# Patient Record
Sex: Female | Born: 1955
Health system: Southern US, Community
[De-identification: ages and names within clinical notes are randomized; demographics above are authoritative.]

## PROBLEM LIST (undated history)

## (undated) DIAGNOSIS — D7589 Other specified diseases of blood and blood-forming organs: Secondary | ICD-10-CM

## (undated) DIAGNOSIS — F419 Anxiety disorder, unspecified: Secondary | ICD-10-CM

## (undated) DIAGNOSIS — E785 Hyperlipidemia, unspecified: Secondary | ICD-10-CM

## (undated) DIAGNOSIS — J45909 Unspecified asthma, uncomplicated: Secondary | ICD-10-CM

## (undated) DIAGNOSIS — K5120 Ulcerative (chronic) proctitis without complications: Secondary | ICD-10-CM

## (undated) DIAGNOSIS — D649 Anemia, unspecified: Secondary | ICD-10-CM

## (undated) DIAGNOSIS — T7840XA Allergy, unspecified, initial encounter: Secondary | ICD-10-CM

## (undated) DIAGNOSIS — D473 Essential (hemorrhagic) thrombocythemia: Secondary | ICD-10-CM

## (undated) HISTORY — DX: Essential (hemorrhagic) thrombocythemia: D47.3

## (undated) HISTORY — PX: NASAL SEPTUM SURGERY: SHX37

## (undated) HISTORY — DX: Ulcerative (chronic) proctitis without complications: K51.20

## (undated) HISTORY — DX: Anxiety disorder, unspecified: F41.9

## (undated) HISTORY — DX: Unspecified asthma, uncomplicated: J45.909

## (undated) HISTORY — DX: Hyperlipidemia, unspecified: E78.5

## (undated) HISTORY — PX: TONSILLECTOMY: SUR1361

## (undated) HISTORY — DX: Allergy, unspecified, initial encounter: T78.40XA

## (undated) HISTORY — PX: COLONOSCOPY W/ BIOPSIES: SHX1374

## (undated) HISTORY — DX: Other specified diseases of blood and blood-forming organs: D75.89

---

## 1998-04-20 ENCOUNTER — Other Ambulatory Visit: Admission: RE | Admit: 1998-04-20 | Discharge: 1998-04-20 | Payer: Self-pay

## 1999-05-19 ENCOUNTER — Other Ambulatory Visit: Admission: RE | Admit: 1999-05-19 | Discharge: 1999-05-19 | Payer: Self-pay | Admitting: *Deleted

## 1999-08-25 ENCOUNTER — Encounter: Admission: RE | Admit: 1999-08-25 | Discharge: 1999-08-25 | Payer: Self-pay | Admitting: *Deleted

## 2000-08-14 ENCOUNTER — Other Ambulatory Visit: Admission: RE | Admit: 2000-08-14 | Discharge: 2000-08-14 | Payer: Self-pay | Admitting: Obstetrics and Gynecology

## 2000-09-11 ENCOUNTER — Encounter: Admission: RE | Admit: 2000-09-11 | Discharge: 2000-09-11 | Payer: Self-pay | Admitting: Obstetrics and Gynecology

## 2000-09-11 ENCOUNTER — Encounter: Payer: Self-pay | Admitting: Obstetrics and Gynecology

## 2000-09-27 ENCOUNTER — Encounter: Payer: Self-pay | Admitting: Family Medicine

## 2000-09-27 ENCOUNTER — Encounter: Admission: RE | Admit: 2000-09-27 | Discharge: 2000-09-27 | Payer: Self-pay | Admitting: Family Medicine

## 2000-11-05 ENCOUNTER — Ambulatory Visit (HOSPITAL_COMMUNITY): Admission: RE | Admit: 2000-11-05 | Discharge: 2000-11-05 | Payer: Self-pay | Admitting: Gastroenterology

## 2002-05-14 ENCOUNTER — Encounter: Admission: RE | Admit: 2002-05-14 | Discharge: 2002-05-14 | Payer: Self-pay | Admitting: Obstetrics and Gynecology

## 2002-05-14 ENCOUNTER — Encounter: Payer: Self-pay | Admitting: Obstetrics and Gynecology

## 2002-07-03 ENCOUNTER — Other Ambulatory Visit: Admission: RE | Admit: 2002-07-03 | Discharge: 2002-07-03 | Payer: Self-pay | Admitting: Obstetrics and Gynecology

## 2003-10-21 ENCOUNTER — Encounter: Admission: RE | Admit: 2003-10-21 | Discharge: 2003-10-21 | Payer: Self-pay | Admitting: Obstetrics and Gynecology

## 2008-06-30 ENCOUNTER — Telehealth (INDEPENDENT_AMBULATORY_CARE_PROVIDER_SITE_OTHER): Payer: Self-pay | Admitting: *Deleted

## 2008-08-18 ENCOUNTER — Other Ambulatory Visit: Admission: RE | Admit: 2008-08-18 | Discharge: 2008-08-18 | Payer: Self-pay | Admitting: Family Medicine

## 2011-03-03 NOTE — Procedures (Signed)
[place]  Patient:    Anita Rice, Anita Rice                           MRN: [id number] Proc. Date: 11/05/00 Attending:  Mickeal Skinner, M.D. CC:         Andee Poles, M.D.   Procedure Report  PROCEDURE:  Colonoscopy and colonic biopsy.  PROCEDURE INDICATION:  [name] (date of birth [birth year]) is a 55 year old female with a 20-year history of left-sided proctocolitis.  She is due for surveillance colonoscopy and colonic biopsies to rule out dysplasia. She recently started prednisone when she had a flare of her colitis, manifested by crampy left lower quadrant abdominal pain, intermittent diarrhea productive of mucus and blood.  I discussed with Ms. Simao the complications associated with colonoscopy and polypectomy, including the intestinal bleeding and intestinal perforation. Ms. Morr has signed the operative permit.  ENDOSCOPIST:  Mickeal Skinner, M.D.  PREMEDICATION:  Fentanyl 100 mcg, Versed 15 mg.  ENDOSCOPE:  Olympus pediatric colonoscope.  DESCRIPTION OF PROCEDURE:  After obtaining informed consent, the patient was placed in the left lateral decubitus position.  I administered intravenous fentanyl and intravenous Versed to achieve conscious sedation for the procedure.  The patients blood pressure and oxygen saturation and cardiac rhythm were monitored throughout the procedure and documented in the medical record.  Anal inspection was normal.  Digital rectal exam was normal.  The Olympus pediatric video colonoscope was used to introduce into the rectum and advanced to the cecum.  Colonic preparation for the exam today was excellent.  Rectum and left colon:  There is generalized proctocolitis extending to approximately 50 cm from the anal verge.  The mucosa appears red and edematous.  There are no ulcers identified.  The mucosa is not friable.  There are no polyps or suspicious mucosal lesions.  Splenic  flexure normal.  Transverse colon normal.  Hepatic flexure normal.  Ascending colon normal.  Cecum and ileocecal valve normal.  Biopsies were taken from the right colon, and biopsies were taken from the transverse-left colon to rule out dysplasia.  ASSESSMENT:  Left-sided proctocolitis.  No signs of colorectal neoplasia. Colonic biopsies to rule out dysplasia are pending. DD:  11/05/00 TD:  11/05/00 Job: [id number]

## 2011-04-05 ENCOUNTER — Other Ambulatory Visit: Payer: Self-pay | Admitting: Obstetrics & Gynecology

## 2011-04-05 DIAGNOSIS — Z1231 Encounter for screening mammogram for malignant neoplasm of breast: Secondary | ICD-10-CM

## 2011-04-20 ENCOUNTER — Ambulatory Visit
Admission: RE | Admit: 2011-04-20 | Discharge: 2011-04-20 | Disposition: A | Discharge status: Home / Self Care | Payer: Self-pay | Source: Ambulatory Visit | Attending: Obstetrics & Gynecology | Admitting: Obstetrics & Gynecology

## 2011-04-20 DIAGNOSIS — Z1231 Encounter for screening mammogram for malignant neoplasm of breast: Secondary | ICD-10-CM

## 2013-07-25 ENCOUNTER — Encounter (INDEPENDENT_AMBULATORY_CARE_PROVIDER_SITE_OTHER): Payer: BC Managed Care – PPO | Admitting: Ophthalmology

## 2013-07-25 DIAGNOSIS — H3560 Retinal hemorrhage, unspecified eye: Secondary | ICD-10-CM

## 2013-07-25 DIAGNOSIS — H43819 Vitreous degeneration, unspecified eye: Secondary | ICD-10-CM

## 2013-07-25 DIAGNOSIS — H2510 Age-related nuclear cataract, unspecified eye: Secondary | ICD-10-CM

## 2013-09-05 ENCOUNTER — Encounter (INDEPENDENT_AMBULATORY_CARE_PROVIDER_SITE_OTHER): Payer: BC Managed Care – PPO | Admitting: Ophthalmology

## 2014-01-02 ENCOUNTER — Other Ambulatory Visit: Payer: Self-pay

## 2014-01-02 DIAGNOSIS — Z1231 Encounter for screening mammogram for malignant neoplasm of breast: Secondary | ICD-10-CM

## 2014-01-22 ENCOUNTER — Ambulatory Visit
Admission: RE | Admit: 2014-01-22 | Discharge: 2014-01-22 | Disposition: A | Discharge status: Home / Self Care | Payer: BC Managed Care – PPO | Source: Ambulatory Visit

## 2014-01-22 DIAGNOSIS — Z1231 Encounter for screening mammogram for malignant neoplasm of breast: Secondary | ICD-10-CM

## 2014-03-30 ENCOUNTER — Other Ambulatory Visit: Payer: Self-pay | Admitting: Gastroenterology

## 2014-06-01 ENCOUNTER — Encounter: Payer: Self-pay | Admitting: Hematology and Oncology

## 2014-06-01 ENCOUNTER — Ambulatory Visit: Payer: BC Managed Care – PPO

## 2014-06-01 ENCOUNTER — Ambulatory Visit (HOSPITAL_BASED_OUTPATIENT_CLINIC_OR_DEPARTMENT_OTHER): Payer: BC Managed Care – PPO | Admitting: Hematology and Oncology

## 2014-06-01 ENCOUNTER — Telehealth: Payer: Self-pay | Admitting: Hematology and Oncology

## 2014-06-01 ENCOUNTER — Encounter (INDEPENDENT_AMBULATORY_CARE_PROVIDER_SITE_OTHER): Payer: Self-pay

## 2014-06-01 VITALS — BP 116/77 | HR 94 | Temp 98.5°F | Resp 18 | Ht 60.0 in | Wt 117.1 lb

## 2014-06-01 DIAGNOSIS — D75839 Thrombocytosis, unspecified: Secondary | ICD-10-CM

## 2014-06-01 DIAGNOSIS — D7589 Other specified diseases of blood and blood-forming organs: Secondary | ICD-10-CM

## 2014-06-01 DIAGNOSIS — G621 Alcoholic polyneuropathy: Secondary | ICD-10-CM | POA: Insufficient documentation

## 2014-06-01 DIAGNOSIS — D473 Essential (hemorrhagic) thrombocythemia: Secondary | ICD-10-CM | POA: Insufficient documentation

## 2014-06-01 DIAGNOSIS — G609 Hereditary and idiopathic neuropathy, unspecified: Secondary | ICD-10-CM

## 2014-06-01 HISTORY — DX: Thrombocytosis, unspecified: D75.839

## 2014-06-01 HISTORY — DX: Other specified diseases of blood and blood-forming organs: D75.89

## 2014-06-01 NOTE — Progress Notes (Signed)
Patient has appt card and [place] been [place]. Checked in new patient with no financial issues prior to seeing dr.

## 2014-06-01 NOTE — Telephone Encounter (Signed)
Pt confirmed labs/ov per 08/17 POF, gave pt AVS...Marland KitchenMarland KitchenKJ

## 2014-06-01 NOTE — Assessment & Plan Note (Signed)
I suspect this is related to alcohol intake. I recommended she discontinue alcohol intake for minimum 2 weeks before her next blood work.

## 2014-06-01 NOTE — Assessment & Plan Note (Signed)
Serum protein electrophoresis showed no abnormal M spike. Serum vitamin B12 and thyroid function tests were normal. I suspect the mild peripheral neuropathy could be related to alcohol intake.

## 2014-06-01 NOTE — Assessment & Plan Note (Signed)
The cause is unknown, but likely to be reactive in nature as it is improving since a recent prednisone taper. I recommend a recheck in in 6 weeks.

## 2014-06-01 NOTE — Progress Notes (Signed)
[place] NOTE  Patient Care Team: Carlos Levering, PA-C as PCP - General (Family Medicine) Carlos Levering, PA-C as Referring Physician (Family Medicine)  CHIEF COMPLAINTS/PURPOSE OF CONSULTATION:  Macrocytosis, thrombocytosis  HISTORY OF PRESENTING ILLNESS:  Anita Rice 57 y.o. female is here because of elevated platelet count and abnormal MCV.  She was found to have abnormal CBC from routine blood monitoring. The patient had recent flare of ulcerative colitis in early June. Between 03/25/2014 to 05/21/2014, she has normal white blood cell count and normal hemoglobin. MCV ranged from 99-103.9 and platelet count ranged from 403-652. She denies recent infection. The last prescription antibiotics was more than 3 months ago There is not reported symptoms of sinus congestion, cough, urinary frequency/urgency or dysuria, diarrhea, joint swelling/pain or abnormal skin rash.  She had no prior history or diagnosis of cancer. Her age appropriate screening programs are up-to-date. She has significant alcohol intake with 2-3 drinks per day. She never had history of blood clots. She complained of some mild numbness in the right big toe and that the middle finger on the right hand. She recently was started on iron therapy due to the high MCV the iron studies subsequently showed adequate ferritin level.  MEDICAL HISTORY:  Past Medical History  Diagnosis Date  . Allergy   . Ulcerative colitis confined to rectum   . Macrocytosis without anemia 06/01/2014  . Thrombocytosis 06/01/2014    SURGICAL HISTORY: Past Surgical History  Procedure Laterality Date  . Colonoscopy w/ biopsies    . Tonsillectomy    . Nasal septum surgery      SOCIAL HISTORY: History   Social History  . Marital Status: Married    Spouse Name: N/A    Number of Children: N/A  . Years of Education: N/A   Occupational History  . Not on file.   Social History Main Topics  . Smoking status:  Never Smoker   . Smokeless tobacco: Never Used  . Alcohol Use: Not on file  . Drug Use: No  . Sexual Activity: Not on file   Other Topics Concern  . Not on file   Social History Narrative  . No narrative on file    FAMILY HISTORY: Family History  Problem Relation Age of Onset  . Cancer Paternal Grandfather     lung ca    ALLERGIES:  is allergic to benadryl and neosporin.  MEDICATIONS:  Current Outpatient Prescriptions  Medication Sig Dispense Refill  . albuterol (PROVENTIL HFA;VENTOLIN HFA) 108 (90 BASE) MCG/ACT inhaler Inhale into the lungs every 6 (six) hours as needed for wheezing or shortness of breath.      . amphetamine-dextroamphetamine (ADDERALL XR) 30 MG 24 hr capsule Take 30 mg by mouth daily.      . budesonide-formoterol (SYMBICORT) 160-4.5 MCG/ACT inhaler Inhale 2 puffs into the lungs daily.      Marland Kitchen desoximetasone (TOPICORT) 0.25 % cream Apply 1 application topically as needed.      . Estradiol (VAGIFEM) 10 MCG TABS vaginal tablet Place vaginally 2 (two) times a week.      . Ferrous Sulfate (IRON) 28 MG TABS Take by mouth daily.      . fexofenadine (ALLEGRA) 180 MG tablet Take 180 mg by mouth daily.      . fluticasone (FLONASE) 50 MCG/ACT nasal spray Place 1 spray into both nostrils daily.      Marland Kitchen ibuprofen (ADVIL,MOTRIN) 200 MG tablet Take 400 mg by mouth every 6 (six) hours as needed.      Marland Kitchen  LORazepam (ATIVAN) 0.5 MG tablet Take 0.5 mg by mouth daily as needed for anxiety.      . Multiple Vitamins-Minerals (CENTRUM SILVER PO) Take by mouth daily.       No current facility-administered medications for this visit.    REVIEW OF SYSTEMS:   Constitutional: Denies fevers, chills or abnormal night sweats Eyes: Denies blurriness of vision, double vision or watery eyes Ears, nose, mouth, throat, and face: Denies mucositis or sore throat Respiratory: Denies cough, dyspnea or wheezes Cardiovascular: Denies palpitation, chest discomfort or lower extremity  swelling Gastrointestinal:  Denies nausea, heartburn Skin: Denies abnormal skin rashes Lymphatics: Denies new lymphadenopathy or easy bruising Neurological:Denies numbness, tingling or new weaknesses Behavioral/Psych: Mood is stable, no new changes  All other systems were reviewed with the patient and are negative.  PHYSICAL EXAMINATION: ECOG PERFORMANCE STATUS: 0 - Asymptomatic  Filed Vitals:   06/01/14 1204  BP: 116/77  Pulse: 94  Temp: 98.5 F (36.9 C)  Resp: 18   Filed Weights   06/01/14 1204  Weight: 117 lb 1.6 oz (53.116 kg)    GENERAL:alert, no distress and comfortable SKIN: skin color, texture, turgor are normal, no rashes or significant lesions EYES: normal, conjunctiva are pink and non-injected, sclera clear OROPHARYNX:no exudate, no erythema and lips, buccal mucosa, and tongue normal  NECK: supple, thyroid normal size, non-tender, without nodularity LYMPH:  no palpable lymphadenopathy in the cervical, axillary or inguinal LUNGS: clear to auscultation and percussion with normal breathing effort HEART: regular rate & rhythm and no murmurs and no lower extremity edema ABDOMEN:abdomen soft, non-tender and normal bowel sounds Musculoskeletal:no cyanosis of digits and no clubbing  PSYCH: alert & oriented x 3 with fluent speech NEURO: no focal motor/sensory deficits  LABORATORY DATA:  I have reviewed the data as listed   ASSESSMENT & PLAN Thrombocytosis The cause is unknown, but likely to be reactive in nature as it is improving since a recent prednisone taper. I recommend a recheck in in 6 weeks.  Macrocytosis without anemia I suspect this is related to alcohol intake. I recommended she discontinue alcohol intake for minimum 2 weeks before her next blood work.  Alcoholic peripheral neuropathy Serum protein electrophoresis showed no abnormal M spike. Serum vitamin B12 and thyroid function tests were normal. I suspect the mild peripheral neuropathy could be  related to alcohol intake.

## 2014-06-02 ENCOUNTER — Ambulatory Visit: Payer: BC Managed Care – PPO | Admitting: Hematology and Oncology

## 2014-06-02 ENCOUNTER — Ambulatory Visit: Payer: BC Managed Care – PPO

## 2014-06-15 ENCOUNTER — Encounter (HOSPITAL_COMMUNITY): Payer: Self-pay | Admitting: Pharmacy Technician

## 2014-07-02 ENCOUNTER — Telehealth: Payer: Self-pay | Admitting: *Deleted

## 2014-07-02 NOTE — Telephone Encounter (Signed)
Left VM for pt to call nurse back regarding CBC results we received from [facility].  Dr. Alvy Bimler says labs are ok and no further labs needed from [place].

## 2014-07-03 ENCOUNTER — Telehealth: Payer: Self-pay | Admitting: *Deleted

## 2014-07-03 ENCOUNTER — Encounter (HOSPITAL_COMMUNITY): Payer: Self-pay | Admitting: Pharmacy Technician

## 2014-07-03 ENCOUNTER — Encounter (HOSPITAL_COMMUNITY): Payer: Self-pay | Admitting: *Deleted

## 2014-07-03 NOTE — Telephone Encounter (Signed)
Informed pt that labs received from PCP office are good and dr. Alvy Bimler says no need for pt to return here for lab or office visit.  Instructed PCP can monitor pts lab and we can see her back in the future if things change.  Pt verbalized understanding. She requests Dr. Alvy Bimler to send message or note to her PCP,  Carlos Levering PA-C,  Informing her of this.

## 2014-07-06 ENCOUNTER — Other Ambulatory Visit: Payer: Self-pay | Admitting: Gastroenterology

## 2014-07-06 NOTE — Anesthesia Preprocedure Evaluation (Signed)
Anesthesia Evaluation  Patient identified by MRN, date of birth, ID band Patient awake    Reviewed: Allergy & Precautions, H&P , NPO status , Patient's Chart, lab work & pertinent test results  Airway       Dental   Pulmonary          Cardiovascular     Neuro/Psych    GI/Hepatic   Endo/Other    Renal/GU      Musculoskeletal   Abdominal   Peds  Hematology   Anesthesia Other Findings   Reproductive/Obstetrics                           Anesthesia Physical Anesthesia Plan  ASA: II  Anesthesia Plan: MAC   Post-op Pain Management:    Induction: Intravenous  Airway Management Planned: Nasal Cannula  Additional Equipment:   Intra-op Plan:   Post-operative Plan:   Informed Consent: I have reviewed the patients History and Physical, chart, labs and discussed the procedure including the risks, benefits and alternatives for the proposed anesthesia with the patient or authorized representative who has indicated his/her understanding and acceptance.     Plan Discussed with:   Anesthesia Plan Comments:         Anesthesia Quick Evaluation

## 2014-07-07 ENCOUNTER — Encounter (HOSPITAL_COMMUNITY): Payer: BC Managed Care – PPO | Admitting: Anesthesiology

## 2014-07-07 ENCOUNTER — Encounter (HOSPITAL_COMMUNITY)
Admission: RE | Disposition: A | Discharge status: Home / Self Care | Payer: Self-pay | Source: Ambulatory Visit | Attending: Gastroenterology

## 2014-07-07 ENCOUNTER — Encounter (HOSPITAL_COMMUNITY): Payer: Self-pay | Admitting: *Deleted

## 2014-07-07 ENCOUNTER — Ambulatory Visit (HOSPITAL_COMMUNITY)
Admission: RE | Admit: 2014-07-07 | Discharge: 2014-07-07 | Disposition: A | Discharge status: Home / Self Care | Payer: BC Managed Care – PPO | Source: Ambulatory Visit | Attending: Gastroenterology | Admitting: Gastroenterology

## 2014-07-07 ENCOUNTER — Ambulatory Visit (HOSPITAL_COMMUNITY): Payer: BC Managed Care – PPO | Admitting: Anesthesiology

## 2014-07-07 DIAGNOSIS — K5100 Ulcerative (chronic) pancolitis without complications: Secondary | ICD-10-CM | POA: Insufficient documentation

## 2014-07-07 HISTORY — PX: COLONOSCOPY WITH PROPOFOL: SHX5780

## 2014-07-07 HISTORY — DX: Anemia, unspecified: D64.9

## 2014-07-07 SURGERY — COLONOSCOPY WITH PROPOFOL
Anesthesia: Monitor Anesthesia Care

## 2014-07-07 MED ORDER — SODIUM CHLORIDE 0.9 % IV SOLN: INTRAVENOUS | Status: DC

## 2014-07-07 MED ORDER — PROPOFOL 10 MG/ML IV BOLUS
INTRAVENOUS | Status: AC
  Filled 2014-07-07: qty 20

## 2014-07-07 MED ORDER — PROPOFOL INFUSION 10 MG/ML OPTIME
INTRAVENOUS | Status: DC | PRN
  Administered 2014-07-07: 75 ug/kg/min via INTRAVENOUS

## 2014-07-07 MED ORDER — MEPERIDINE HCL 100 MG/ML IJ SOLN: 6.2500 mg | INTRAMUSCULAR | Status: DC | PRN

## 2014-07-07 MED ORDER — FENTANYL CITRATE 0.05 MG/ML IJ SOLN: 25.0000 ug | INTRAMUSCULAR | Status: DC | PRN

## 2014-07-07 MED ORDER — PROMETHAZINE HCL 25 MG/ML IJ SOLN: 6.2500 mg | INTRAMUSCULAR | Status: DC | PRN

## 2014-07-07 MED ORDER — LACTATED RINGERS IV SOLN
INTRAVENOUS | Status: DC
  Administered 2014-07-07: 1000 mL via INTRAVENOUS

## 2014-07-07 MED ORDER — PROPOFOL 10 MG/ML IV BOLUS
INTRAVENOUS | Status: DC | PRN
  Administered 2014-07-07 (×4): 20 mg via INTRAVENOUS
  Administered 2014-07-07: 40 mg via INTRAVENOUS
  Administered 2014-07-07 (×2): 20 mg via INTRAVENOUS

## 2014-07-07 MED ORDER — LACTATED RINGERS IV SOLN
INTRAVENOUS | Status: DC | PRN
  Administered 2014-07-07: 11:00:00 via INTRAVENOUS

## 2014-07-07 SURGICAL SUPPLY — 22 items

## 2014-07-07 NOTE — Anesthesia Postprocedure Evaluation (Signed)
  Anesthesia Post-op Note  Patient: Anita Rice  Procedure(s) Performed: Procedure(s): COLONOSCOPY WITH PROPOFOL (N/A)  Patient Location: PACU  Anesthesia Type:MAC  Level of Consciousness: awake and alert   Airway and Oxygen Therapy: Patient Spontanous Breathing and Patient connected to nasal cannula oxygen  Post-op Pain: none  Post-op Assessment: Post-op Vital signs reviewed  Post-op Vital Signs: Reviewed and stable  Last Vitals:  Filed Vitals:   07/07/14 1138  BP: 101/63  Pulse: 85  Temp:   Resp: 17    Complications: No apparent anesthesia complications

## 2014-07-07 NOTE — Op Note (Signed)
Procedure: Surveillance colonoscopy. Universal ulcerative proctocolitis diagnosed at age 58. 09/28/2008 colonoscopy showed mild proctosigmoiditis without dysplasia  Endoscopist: [name]  Premedication: Propofol administered by anesthesia  Procedure: The patient was placed in the left lateral decubitus position. Anal inspection and digital rectal exam were normal. The Pentax pediatric colonoscope was introduced into the rectum and advanced to the cecum. A normal-appearing appendiceal orifice was identified. A normal-appearing ileocecal valve was intubated and the terminal ileum inspected. Colonic preparation for the exam today was good.  Rectum. Normal except for generalized lost in the mucosal vascular pattern unassociated with a mucosal friability or ulceration.  Sigmoid colon and descending colon. Normal  Splenic flexure. Normal  Transverse colon. Normal  Hepatic flexure. Normal  Ascending colon. Normal  Cecum and ileocecal valve. Normal  Terminal ileum. Normal  Surveillance mucosal biopsies: 8 biopsies were performed from the right colon, 8 biopsies were performed from the transverse colon, 8 biopsies were performed from the left colon, 8 biopsies were performed from the rectosigmoid colon.  Assessment: Normal surveillance colonoscopy. Inactive universal ulcerative colitis. Surveillance mucosal biopsies pending.

## 2014-07-07 NOTE — H&P (Signed)
  Procedure: Surveillance colonoscopy. 09/28/2008 colonoscopy showed mild proctosigmoiditis without dysplasia. Chronic universal ulcerative colitis  History: The patient is a 58 year old female born October 11, 1956. She has chronic universal ulcerative colitis. On 03/30/2014, the patient developed bloody diarrhea unassociated with prior antibiotic use. Her bloody diarrhea resolved after a course of prednisone  The patient reports normal bowel function and denies abdominal pain, diarrhea, or gastrointestinal bleeding.  She is scheduled to undergo a surveillance colonoscopy today.  Past medical history: Anal sphincter repair. Nasal surgery. Tonsillectomy. Retinal tear. Allergic rhinitis. Mild asthma. Atopic dermatitis. Chronic universal ulcerative colitis. Shingles diagnosed in April 2014.  Medication allergies: Neosporin causes rash.  Exam: The patient is alert and lying comfortably on the endoscopy stretcher. Abdomen is soft and nontender to palpation. Lungs are clear to auscultation. Cardiac exam reveals a regular rhythm.  Plan: Proceed with surveillance colonoscopy

## 2014-07-07 NOTE — Transfer of Care (Signed)
Immediate Anesthesia Transfer of Care Note  Patient: Anita Rice  Procedure(s) Performed: Procedure(s) (LRB): COLONOSCOPY WITH PROPOFOL (N/A)  Patient Location: PACU  Anesthesia Type: MAC  Level of Consciousness: sedated, patient cooperative and responds to stimulation  Airway & Oxygen Therapy: Patient Spontanous Breathing and Patient connected to face mask oxgen  Post-op Assessment: Report given to PACU RN and Post -op Vital signs reviewed and stable  Post vital signs: Reviewed and stable  Complications: No apparent anesthesia complications

## 2014-07-08 ENCOUNTER — Encounter (HOSPITAL_COMMUNITY): Payer: Self-pay | Admitting: Gastroenterology

## 2014-07-20 ENCOUNTER — Other Ambulatory Visit: Payer: BC Managed Care – PPO

## 2014-07-23 ENCOUNTER — Ambulatory Visit: Payer: BC Managed Care – PPO | Admitting: Hematology and Oncology

## 2014-07-31 ENCOUNTER — Telehealth: Payer: Self-pay | Admitting: Nurse Practitioner

## 2014-07-31 ENCOUNTER — Telehealth: Payer: Self-pay | Admitting: Hematology and Oncology

## 2014-07-31 NOTE — Telephone Encounter (Signed)
Patient informed that Dr. Alvy Bimler reviewed labs and wants to see her back in 1 month. Patient prefers 08/21/14 at 2:30.

## 2014-07-31 NOTE — Telephone Encounter (Signed)
Confirm appt d/t per 07/31/14 for 08/21/14.

## 2014-07-31 NOTE — Telephone Encounter (Signed)
POF sent to scheduling on 07/31/14 for f/u appt 15 min at 2:30 per patient request. OK per Dr. Alvy Bimler.

## 2014-08-03 NOTE — Progress Notes (Signed)
HPI: 58 year old female for evaluation of chest pain. Patient does note mild dyspnea on exertion but there is no orthopnea, PND, pedal edema, palpitations, syncope. She had brief chest pain for one to 2 seconds some weeks ago but does not have exertional chest pain. She has a strong family history of coronary disease and presented for further evaluation. Note she also has some fatigue although she is presently being evaluated for elevated platelet count.  Current Outpatient Prescriptions  Medication Sig Dispense Refill  . albuterol (PROVENTIL HFA;VENTOLIN HFA) 108 (90 BASE) MCG/ACT inhaler Inhale into the lungs every 6 (six) hours as needed for wheezing or shortness of breath.      . amphetamine-dextroamphetamine (ADDERALL XR) 30 MG 24 hr capsule Take 30 mg by mouth every morning.       . budesonide-formoterol (SYMBICORT) 160-4.5 MCG/ACT inhaler Inhale 2 puffs into the lungs every morning.       . desoximetasone (TOPICORT) 0.25 % cream Apply 1 application topically once as needed (psoriasis).       . Estradiol (VAGIFEM) 10 MCG TABS vaginal tablet Place vaginally 2 (two) times a week.      . Ferrous Sulfate (IRON) 28 MG TABS Take 1 tablet by mouth every morning.       . fexofenadine (ALLEGRA) 180 MG tablet Take 180 mg by mouth every morning.       . fluticasone (FLONASE) 50 MCG/ACT nasal spray Place 1 spray into both nostrils every morning.       Marland Kitchen ibuprofen (ADVIL,MOTRIN) 200 MG tablet Take 400 mg by mouth every 6 (six) hours as needed for headache or mild pain.       Marland Kitchen LORazepam (ATIVAN) 0.5 MG tablet Take 0.5 mg by mouth daily as needed for anxiety.      . Multiple Vitamins-Minerals (CENTRUM SILVER PO) Take 1 tablet by mouth every morning.        No current facility-administered medications for this visit.    Allergies  Allergen Reactions  . Benadryl [Diphenhydramine] Itching  . Neosporin [Neomycin-Bacitracin Zn-Polymyx] Rash    Past Medical History  Diagnosis Date  . Allergy     . Ulcerative colitis confined to rectum   . Macrocytosis without anemia 06/01/2014  . Thrombocytosis 06/01/2014  . Anemia   . Hyperlipidemia     Past Surgical History  Procedure Laterality Date  . Colonoscopy w/ biopsies    . Tonsillectomy    . Nasal septum surgery    . Colonoscopy with propofol N/A 07/07/2014    Procedure: COLONOSCOPY WITH PROPOFOL;  Surgeon: Garlan Fair, MD;  Location: [facility];  Service: Endoscopy;  Laterality: N/A;    History   Social History  . Marital Status: Married    Spouse Name: N/A    Number of Children: 2  . Years of Education: N/A   Occupational History  .      Glass blower/designer   Social History Main Topics  . Smoking status: Former Research scientist (life sciences)  . Smokeless tobacco: Never Used  . Alcohol Use: 8.4 oz/week    14 Glasses of wine per week     Comment: Occasional  . Drug Use: No  . Sexual Activity: Not on file   Other Topics Concern  . Not on file   Social History Narrative  . No narrative on file    Family History  Problem Relation Age of Onset  . Cancer Paternal Grandfather     lung ca  . CAD Father  Cabg at age 67  . CAD Mother     MI at age 20    ROS: Fatigue and bruising but no fevers or chills, productive cough, hemoptysis, dysphasia, odynophagia, melena, hematochezia, dysuria, hematuria, rash, seizure activity, orthopnea, PND, pedal edema, claudication. Remaining systems are negative.  Physical Exam:   Blood pressure 110/74, pulse 94, height 5' (1.524 m), weight 118 lb 6.4 oz (53.706 kg).  General:  Well developed/well nourished in NAD Skin warm/dry Patient not depressed No peripheral clubbing Back-normal HEENT-normal/normal eyelids Neck supple/normal carotid upstroke bilaterally; no bruits; no JVD; no thyromegaly chest - CTA/ normal expansion CV - RRR/normal S1 and S2; no murmurs, rubs or gallops;  PMI nondisplaced Abdomen -NT/ND, no HSM, no mass, + bowel sounds, no bruit 2+ femoral pulses, no bruits Ext-no  edema, chords, 2+ DP Neuro-grossly nonfocal  ECG Sinus rhythm at a rate of 94. No ST changes.

## 2014-08-04 ENCOUNTER — Encounter: Payer: Self-pay | Admitting: Cardiology

## 2014-08-04 ENCOUNTER — Ambulatory Visit (INDEPENDENT_AMBULATORY_CARE_PROVIDER_SITE_OTHER): Payer: BC Managed Care – PPO | Admitting: Cardiology

## 2014-08-04 VITALS — BP 110/74 | HR 94 | Ht 60.0 in | Wt 118.4 lb

## 2014-08-04 DIAGNOSIS — R079 Chest pain, unspecified: Secondary | ICD-10-CM

## 2014-08-04 DIAGNOSIS — I2510 Atherosclerotic heart disease of native coronary artery without angina pectoris: Secondary | ICD-10-CM

## 2014-08-04 DIAGNOSIS — R5383 Other fatigue: Secondary | ICD-10-CM

## 2014-08-04 NOTE — Assessment & Plan Note (Signed)
Etiology unclear. Recent hemoglobin normal. Check TSH. Followup primary care for history of elevated platelet count.

## 2014-08-04 NOTE — Patient Instructions (Signed)
Your physician recommends that you schedule a follow-up appointment in: AS NEEDED PENDING TEST RESULTS  Your physician recommends that you HAVE LAB [place]  Your physician has requested that you have an exercise tolerance test. For further information please visit [email or web address]. Please also follow instruction sheet, as given.   CARDIAC CT CALCIUM SCORE AT [place]

## 2014-08-04 NOTE — Assessment & Plan Note (Signed)
Patient has atypical chest pain and mild dyspnea on exertion. However she has a strong family history of coronary disease and is also concerned. We will arrange an exercise treadmill. Also arrange calcium score via CT.

## 2014-08-05 ENCOUNTER — Ambulatory Visit (INDEPENDENT_AMBULATORY_CARE_PROVIDER_SITE_OTHER)
Admission: RE | Admit: 2014-08-05 | Discharge: 2014-08-05 | Disposition: A | Discharge status: Home / Self Care | Payer: Self-pay | Source: Ambulatory Visit | Attending: Cardiology | Admitting: Cardiology

## 2014-08-05 DIAGNOSIS — I2510 Atherosclerotic heart disease of native coronary artery without angina pectoris: Secondary | ICD-10-CM

## 2014-08-05 DIAGNOSIS — R079 Chest pain, unspecified: Secondary | ICD-10-CM

## 2014-08-05 DIAGNOSIS — R5383 Other fatigue: Secondary | ICD-10-CM

## 2014-08-05 LAB — TSH: TSH: 1.181 u[IU]/mL (ref 0.350–4.500)

## 2014-08-06 ENCOUNTER — Other Ambulatory Visit: Payer: BC Managed Care – PPO

## 2014-08-07 ENCOUNTER — Ambulatory Visit (HOSPITAL_COMMUNITY)
Admission: RE | Admit: 2014-08-07 | Discharge: 2014-08-07 | Disposition: A | Discharge status: Home / Self Care | Payer: BC Managed Care – PPO | Source: Ambulatory Visit | Attending: Cardiovascular Disease | Admitting: Cardiovascular Disease

## 2014-08-07 DIAGNOSIS — R079 Chest pain, unspecified: Secondary | ICD-10-CM | POA: Diagnosis not present

## 2014-08-07 DIAGNOSIS — I2510 Atherosclerotic heart disease of native coronary artery without angina pectoris: Secondary | ICD-10-CM

## 2014-08-07 NOTE — Procedures (Signed)
Exercise Treadmill Test  Pre-Exercise Testing Evaluation  NSR, normal tracing  Test  Exercise Tolerance Test Ordering MD: Kirk Ruths, MD    Unique Test No: 1  Treadmill:  1  Indication for ETT: chest pain - rule out ischemia  Contraindication to ETT: No   Stress Modality: exercise - treadmill  Cardiac Imaging Performed: non   Protocol: standard Bruce - maximal  Max BP:  148/76  Max MPHR (bpm):  162 85% MPR (bpm):  137  MPHR obtained (bpm):  176 % MPHR obtained:  108  Reached 85% MPHR (min:sec):  1:12 Total Exercise Time (min-sec):  7  Workload in METS:  8.5 Borg Scale: 14  Reason ETT Terminated:  Leg fatigue    ST Segment Analysis At Rest: normal ST segments - no evidence of significant ST depression With Exercise: no evidence of significant ST depression  Other Information Arrhythmia:  No Angina during ETT:  absent (0) Quality of ETT:  diagnostic  ETT Interpretation:  normal - no evidence of ischemia by ST analysis  Comments: Fair exercise tolerance. Normotensive response to exercise.   Sanda Klein, MD, [facility] [phone] office [phone] pager

## 2014-08-21 ENCOUNTER — Encounter: Payer: Self-pay | Admitting: Hematology and Oncology

## 2014-08-21 ENCOUNTER — Telehealth: Payer: Self-pay | Admitting: *Deleted

## 2014-08-21 ENCOUNTER — Ambulatory Visit (HOSPITAL_BASED_OUTPATIENT_CLINIC_OR_DEPARTMENT_OTHER): Payer: BC Managed Care – PPO | Admitting: Hematology and Oncology

## 2014-08-21 VITALS — BP 119/82 | HR 83 | Temp 97.0°F | Resp 18 | Ht 60.0 in | Wt 119.0 lb

## 2014-08-21 DIAGNOSIS — Z23 Encounter for immunization: Secondary | ICD-10-CM

## 2014-08-21 DIAGNOSIS — D75839 Thrombocytosis, unspecified: Secondary | ICD-10-CM

## 2014-08-21 DIAGNOSIS — R5383 Other fatigue: Secondary | ICD-10-CM

## 2014-08-21 DIAGNOSIS — D7589 Other specified diseases of blood and blood-forming organs: Secondary | ICD-10-CM

## 2014-08-21 DIAGNOSIS — D473 Essential (hemorrhagic) thrombocythemia: Secondary | ICD-10-CM

## 2014-08-21 MED ORDER — INFLUENZA VAC SPLIT QUAD 0.5 ML IM SUSY
0.5000 mL | PREFILLED_SYRINGE | Freq: Once | INTRAMUSCULAR | Status: DC
  Filled 2014-08-21: qty 0.5

## 2014-08-21 NOTE — Progress Notes (Signed)
[facility] OFFICE PROGRESS NOTE  WILLARD,JENNIFER, PA-C SUMMARY OF HEMATOLOGIC HISTORY: Anita Rice 58 y.o. female is here because of elevated platelet count and abnormal MCV. She was found to have abnormal CBC from routine blood monitoring. The patient had recent flare of ulcerative colitis in early June. Between 03/25/2014 to 05/21/2014, she has normal white blood cell count and normal hemoglobin. MCV ranged from 99-103.9 and platelet count ranged from 403-652. She denies recent infection.  She has significant alcohol intake with 2-3 drinks per day. She never had history of blood clots. She complained of some mild numbness in the right big toe and that the middle finger on the right hand. She recently was started on iron therapy due to the high MCV the iron studies subsequently showed adequate ferritin level.  INTERVAL HISTORY: Anita Rice 58 y.o. female returns for further follow-up and review of blood tests. She continues to have mild fluctuation of MCV and platelet count. She is not symptomatic.  I have reviewed the past medical history, past surgical history, social history and family history with the patient and they are unchanged from previous note.  ALLERGIES:  is allergic to benadryl and neosporin.  MEDICATIONS:  Current Outpatient Prescriptions  Medication Sig Dispense Refill  . albuterol (PROVENTIL HFA;VENTOLIN HFA) 108 (90 BASE) MCG/ACT inhaler Inhale into the lungs every 6 (six) hours as needed for wheezing or shortness of breath.    . amphetamine-dextroamphetamine (ADDERALL XR) 30 MG 24 hr capsule Take 30 mg by mouth every morning.     . budesonide-formoterol (SYMBICORT) 160-4.5 MCG/ACT inhaler Inhale 2 puffs into the lungs every morning.     . desoximetasone (TOPICORT) 0.25 % cream Apply 1 application topically once as needed (psoriasis).     . Estradiol (VAGIFEM) 10 MCG TABS vaginal tablet Place vaginally 2 (two) times a week.    . Ferrous Sulfate  (IRON) 28 MG TABS Take 1 tablet by mouth every morning.     . fexofenadine (ALLEGRA) 180 MG tablet Take 180 mg by mouth every morning.     . fluticasone (FLONASE) 50 MCG/ACT nasal spray Place 1 spray into both nostrils every morning.     Marland Kitchen ibuprofen (ADVIL,MOTRIN) 200 MG tablet Take 400 mg by mouth every 6 (six) hours as needed for headache or mild pain.     Marland Kitchen LORazepam (ATIVAN) 0.5 MG tablet Take 0.5 mg by mouth daily as needed for anxiety.    . Multiple Vitamins-Minerals (CENTRUM SILVER PO) Take 1 tablet by mouth every morning.      Current Facility-Administered Medications  Medication Dose Route Frequency Provider Last Rate Last Dose  . Influenza vac split quadrivalent PF (FLUARIX) injection 0.5 mL  0.5 mL Intramuscular Once Heath Lark, MD         REVIEW OF SYSTEMS:   Constitutional: Denies fevers, chills or night sweats Eyes: Denies blurriness of vision Ears, nose, mouth, throat, and face: Denies mucositis or sore throat Respiratory: Denies cough, dyspnea or wheezes Cardiovascular: Denies palpitation, chest discomfort or lower extremity swelling Gastrointestinal:  Denies nausea, heartburn or change in bowel habits Skin: Denies abnormal skin rashes Lymphatics: Denies new lymphadenopathy or easy bruising Neurological:Denies numbness, tingling or new weaknesses Behavioral/Psych: Mood is stable, no new changes  All other systems were reviewed with the patient and are negative.  PHYSICAL EXAMINATION: ECOG PERFORMANCE STATUS: 0 - Asymptomatic  Filed Vitals:   08/21/14 1500  BP: 119/82  Pulse: 83  Temp: 97 F (36.1 C)  Resp: 18  Filed Weights   08/21/14 1500  Weight: 119 lb (53.978 kg)    GENERAL:alert, no distress and comfortable SKIN: skin color, texture, turgor are normal, no rashes or significant lesions EYES: normal, Conjunctiva are pink and non-injected, sclera clear OROPHARYNX:no exudate, no erythema and lips, buccal mucosa, and tongue normal  NECK: supple, thyroid  normal size, non-tender, without nodularity LYMPH:  no palpable lymphadenopathy in the cervical, axillary or inguinal LUNGS: clear to auscultation and percussion with normal breathing effort HEART: regular rate & rhythm and no murmurs and no lower extremity edema ABDOMEN:abdomen soft, non-tender and normal bowel sounds Musculoskeletal:no cyanosis of digits and no clubbing  NEURO: alert & oriented x 3 with fluent speech, no focal motor/sensory deficits  LABORATORY DATA:  I have reviewed the data as listed ASSESSMENT & PLAN:  Macrocytosis without anemia This has improved since she cut back on alcohol intake. I reassured her that this is benign.  Thrombocytosis This is mild and has fluctuated up and down in the past. I reassured her that this is benign. No further workup is needed.  Fatigue The patient is not anemic. Recent TSH is normal. I reassured her this is not related to her blood abnormalities.   All questions were answered. The patient knows to call the clinic with any problems, questions or concerns. No barriers to learning was detected.  I spent 15 minutes counseling the patient face to face. The total time spent in the appointment was 20 minutes and more than 50% was on counseling.     [facility], [place], MD 08/21/2014 4:01 PM

## 2014-08-21 NOTE — Assessment & Plan Note (Signed)
The patient is not anemic. Recent TSH is normal. I reassured her this is not related to her blood abnormalities.

## 2014-08-21 NOTE — Telephone Encounter (Signed)
Pt agreed to flu shot today when checked in by collaborative nurse.  Unfortunately she left after MD appt before nurse could give her flu shot.  Called pt and left her VM apologizing for not giving flu shot today.  Asked her to call back if she wants to come back for flu shot.

## 2014-08-21 NOTE — Assessment & Plan Note (Signed)
This has improved since she cut back on alcohol intake. I reassured her that this is benign.

## 2014-08-21 NOTE — Assessment & Plan Note (Signed)
This is mild and has fluctuated up and down in the past. I reassured her that this is benign. No further workup is needed.

## 2015-02-18 ENCOUNTER — Other Ambulatory Visit (HOSPITAL_COMMUNITY): Payer: Self-pay | Admitting: Family Medicine

## 2015-02-18 ENCOUNTER — Other Ambulatory Visit (HOSPITAL_COMMUNITY)
Admission: RE | Admit: 2015-02-18 | Discharge: 2015-02-18 | Disposition: A | Discharge status: Home / Self Care | Payer: BLUE CROSS/BLUE SHIELD | Source: Ambulatory Visit | Attending: Family Medicine | Admitting: Family Medicine

## 2015-02-18 DIAGNOSIS — Z124 Encounter for screening for malignant neoplasm of cervix: Secondary | ICD-10-CM | POA: Diagnosis present

## 2015-02-18 DIAGNOSIS — Z1151 Encounter for screening for human papillomavirus (HPV): Secondary | ICD-10-CM | POA: Diagnosis present

## 2015-02-19 LAB — CYTOLOGY - PAP

## 2016-02-02 DIAGNOSIS — L259 Unspecified contact dermatitis, unspecified cause: Secondary | ICD-10-CM | POA: Diagnosis not present

## 2016-02-02 DIAGNOSIS — S20211A Contusion of right front wall of thorax, initial encounter: Secondary | ICD-10-CM | POA: Diagnosis not present

## 2016-02-04 DIAGNOSIS — R21 Rash and other nonspecific skin eruption: Secondary | ICD-10-CM | POA: Diagnosis not present

## 2016-02-07 DIAGNOSIS — L308 Other specified dermatitis: Secondary | ICD-10-CM | POA: Diagnosis not present

## 2016-08-02 ENCOUNTER — Ambulatory Visit (INDEPENDENT_AMBULATORY_CARE_PROVIDER_SITE_OTHER): Payer: BLUE CROSS/BLUE SHIELD

## 2016-08-02 ENCOUNTER — Ambulatory Visit (INDEPENDENT_AMBULATORY_CARE_PROVIDER_SITE_OTHER): Payer: BLUE CROSS/BLUE SHIELD | Admitting: Physician Assistant

## 2016-08-02 ENCOUNTER — Encounter: Payer: Self-pay | Admitting: Physician Assistant

## 2016-08-02 VITALS — BP 100/64 | HR 75 | Temp 97.6°F | Resp 18 | Ht 60.0 in | Wt 125.0 lb

## 2016-08-02 DIAGNOSIS — Z23 Encounter for immunization: Secondary | ICD-10-CM | POA: Diagnosis not present

## 2016-08-02 DIAGNOSIS — M25572 Pain in left ankle and joints of left foot: Secondary | ICD-10-CM

## 2016-08-02 DIAGNOSIS — S99912A Unspecified injury of left ankle, initial encounter: Secondary | ICD-10-CM | POA: Diagnosis not present

## 2016-08-02 DIAGNOSIS — S92252A Displaced fracture of navicular [scaphoid] of left foot, initial encounter for closed fracture: Secondary | ICD-10-CM

## 2016-08-02 NOTE — Patient Instructions (Addendum)
[facility] will call you to schedule an appointment at their office this week. Please do not bear weight with your left foot. Use crutches in order to do this. Use anti-inflammatories as needed for pain. Elevate your foot and use ice to keep swelling at a minimum.  Please do not remove the splint. Wrap it with a trash bag to bathe. If you feel the ace wrap it too tight and is causing you pain, you may re-wrap as needed.   Thank you for coming in today. I hope you feel we met your needs.  Feel free to call [facility] if you have any questions or further requests.  Please consider signing up for MyChart if you do not already have it, as this is a great way to communicate with me.  Best,  Whitney McVey, PA-C   IF you received an x-ray today, you will receive an invoice from [facility]. Please contact [facility] at [phone] with questions or concerns regarding your invoice.   IF you received labwork today, you will receive an invoice from [employer]. Please contact [facility] at [phone] with questions or concerns regarding your invoice.   Our billing staff will not be able to assist you with questions regarding bills from these companies.  You will be contacted with the lab results as soon as they are available. The fastest way to get your results is to activate your My Chart account. Instructions are located on the last page of this paperwork. If you have not heard from [place] regarding the results in 2 weeks, please contact this office.     Tarsal Navicular Fracture A tarsal navicular fracture is a break in the navicular bone in your foot. The navicular bone is at the top of the middle of your foot. It is one of the bones in a group of bones called the tarsal bones. The navicular bone is wedged between other bones. Running and jumping put a lot of pressure on your navicular bone. Tarsal navicular fractures occur most often in athletes.   CAUSES  A tarsal navicular fracture can be caused by:  Severe twisting of your foot.  Something heavy falling on your foot.  Stress on the navicular bone from your foot striking the ground repeatedly (stress fracture). RISK FACTORS You may be at risk for a navicular fracture if you participate in high-impact activities such as:  Track and field.  Football.  Soccer.  Basketball.  Gymnastics.  Ballet dancing. Other risk factors include:  Being a woman with an irregular menstrual cycle.  Having a condition that causes your bones to become thin and brittle (osteoporosis).  Being a smoker.  Starting a new sport without being in good shape.  Wearing athletic shoes that do not fit well. SIGNS AND SYMPTOMS An aching pain at the top of your foot is the most common symptom. The pain may move down into the arch of your foot. The pain will get worse with activity and better with rest. Other symptoms may include:  Swelling on the top of your foot.  Pain when pressing on the top of your foot.  Pain when hopping on your foot. DIAGNOSIS  Your health care provider may suspect a tarsal navicular fracture if you recently injured your foot and have symptoms of a fracture. A physical exam will be done. During this exam, your health care provider may move your foot into different positions to check for pain. If you have pain when the health care  provider presses on your navicular bone, then it is very likely that you have a navicular fracture. An X-ray of your foot may be done to help confirm the diagnosis. Regular X-rays often do not show a stress fracture. You may need to have other imaging studies, such as:  A bone scan.  A CT scan.  An MRI. TREATMENT  Your health care provider will determine the best treatment for you based on the severity of your fracture.   If the broken bone is in good alignment, a cast or splint may be applied. The cast or splint will likely need to be worn  for several weeks. While the cast or splint is on, you cannot put weight on your foot. You will need close follow-up with your health care provider to make sure you are healing.  Rarely, if the fracture is severe and the broken bone is out of place, your health care provider will need to align the fracture using a surgical procedure called open reduction and internal fixation (ORIF).  In the ORIF procedure, the fracture site is opened up, and the bone pieces are fixed into place with metal screws or pins.  After surgery, you may need to wear a cast or splint. You will also need close follow-up with your health care provider to make sure you are healing. HOME CARE INSTRUCTIONS   Use crutches as directed by your health care provider. Do not put weight on your injured foot until your health care provider approves.  If you have a plaster or fiberglass cast:   Do not try to scratch the skin under the cast with sharp or pointed objects.  Check the skin around the cast every day. You may put lotion on any red or sore areas.   Keep your cast dry and clean.  Use a plastic bag to protect your cast or splint from water while bathing. Do not lower your cast or splint into water.  Take medicines only as directed by your health care provider.  Keep all follow-up visits as directed by your health care provider. This is important. SEEK MEDICAL CARE IF:   You have very bad pain, and medicine is not helping.  You have more than a small spot of bleeding from under your cast or splint.  You have drainage, redness, or swelling at the injury site.  You have a fever.  You notice a bad smell coming from your cast or splint.   Your cast or splint cracks, breaks, or gets wet. SEEK IMMEDIATE MEDICAL CARE IF:   You begin to lose feeling in your foot or toes.  You have swelling in your foot or toes that is increasing.  Your foot or toes feel cold or turn blue.  You develop a rash.  MAKE SURE  YOU:  Understand these instructions.  Will watch your condition.  Will get help right away if you are not doing well or get worse.   This information is not intended to replace advice given to you by your health care provider. Make sure you discuss any questions you have with your health care provider.   Document Released: 01/14/2001 Document Revised: 10/23/2014 Document Reviewed: 12/05/2013 Elsevier Interactive Patient Education [employer].

## 2016-08-02 NOTE — Progress Notes (Signed)
Anita Rice  MRN: [id number] DOB: 12-21-1955  PCP: Wynelle Fanny  Subjective:  Pt is a 60 year old female who presents to clinic for injured left ankle.  Two weeks ago she rolled her ankle on a curb and fell. Did not feel pop or tear. No pain or swelling at time of injury. Noted bruising surrounding her ankle at the time. She was on vacation in [place] and was able to walk with no problems/issues/pain.  12 days later she noticed swelling of left ankle. Painful to touch above her fourth toe and in the front, center of ankle. No pain while she is still, pain with movement/walking. Her pain increased last night. She elevated and iced, felt better.  No history of ankle injuries/surgery. 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  Smokes 1 ppd.  Review of Systems  Respiratory: Negative.   Cardiovascular: Negative.   Musculoskeletal: Positive for arthralgias (left ankle pain) and gait problem. Negative for back pain and joint swelling.  Skin: Negative for color change, pallor and wound.  Neurological: Negative for dizziness, weakness and numbness.    Patient Active Problem List   Diagnosis Date Noted  . Chest pain 08/04/2014  . Fatigue 08/04/2014  . Macrocytosis without anemia 06/01/2014  . Thrombocytosis ([place]) 06/01/2014  . Alcoholic peripheral neuropathy ([place]) 06/01/2014    Current Outpatient Prescriptions on File Prior to Visit  Medication Sig Dispense Refill  . albuterol (PROVENTIL HFA;VENTOLIN HFA) 108 (90 BASE) MCG/ACT inhaler Inhale into the lungs every 6 (six) hours as needed for wheezing or shortness of breath.    . amphetamine-dextroamphetamine (ADDERALL XR) 30 MG 24 hr capsule Take 30 mg by mouth every morning.     . budesonide-formoterol (SYMBICORT) 160-4.5 MCG/ACT inhaler Inhale 2 puffs into the lungs every morning.     . desoximetasone (TOPICORT) 0.25 % cream Apply 1 application topically once as needed (psoriasis).     . Estradiol (VAGIFEM) 10 MCG TABS  vaginal tablet Place vaginally 2 (two) times a week.    . Ferrous Sulfate (IRON) 28 MG TABS Take 1 tablet by mouth every morning.     . fexofenadine (ALLEGRA) 180 MG tablet Take 180 mg by mouth every morning.     . fluticasone (FLONASE) 50 MCG/ACT nasal spray Place 1 spray into both nostrils every morning.     Marland Kitchen ibuprofen (ADVIL,MOTRIN) 200 MG tablet Take 400 mg by mouth every 6 (six) hours as needed for headache or mild pain.     Marland Kitchen LORazepam (ATIVAN) 0.5 MG tablet Take 0.5 mg by mouth daily as needed for anxiety.    . Multiple Vitamins-Minerals (CENTRUM SILVER PO) Take 1 tablet by mouth every morning.      No current facility-administered medications on file prior to visit.     Allergies  Allergen Reactions  . Benadryl [Diphenhydramine] Itching  . Neosporin [Neomycin-Bacitracin Zn-Polymyx] Rash    Objective:  BP 100/64 (BP Location: Right Arm, Patient Position: Sitting, Cuff Size: Small)   Pulse 75   Temp 97.6 F (36.4 C) (Oral)   Resp 18   Ht 5' (1.524 m)   Wt 125 lb (56.7 kg)   SpO2 99%   BMI 24.41 kg/m   Physical Exam  Constitutional: She is oriented to person, place, and time and well-developed, well-nourished, and in no distress. No distress.  Cardiovascular: Normal rate, regular rhythm and normal heart sounds.   Musculoskeletal:       Right shoulder: Swelling: minor.  Left ankle: She exhibits normal range of motion, no swelling, no deformity and normal pulse. Tenderness. Lateral malleolus and AITFL tenderness found. Achilles tendon normal.  TTP distal medial malleolus. No decreased strength with passive dorsal or plantar flexion or against resistance. Normal gait. Normal ROM. Sensation in tact.   Neurological: She is alert and oriented to person, place, and time. GCS score is 15.  Skin: Skin is warm and dry.  Psychiatric: Mood, memory, affect and judgment normal.  Vitals reviewed.   Dg Ankle Complete Left  Result Date: 08/02/2016 CLINICAL DATA:  Left ankle  pain after fall 2 weeks ago. EXAM: LEFT ANKLE COMPLETE - 3+ VIEW COMPARISON:  None. FINDINGS: There is a possible mildly displaced fracture involving the superior surface of the navicular bone. The tibia and fibula appear normal. Joint spaces are intact. No soft tissue abnormality is noted. IMPRESSION: Possible mildly displaced fracture arising from the superior aspect of the navicular bone. No other abnormality seen. Electronically Signed   By: Marijo Conception, M.D.   On: 08/02/2016 10:32    Assessment and Plan :  1. Closed displaced fracture of navicular bone of left foot, initial encounter 2. Pain of joint of left ankle and foot - Ambulatory referral to Orthopedic Surgery - DG Ankle Complete Left; Future - Applied a padded posterior splint with side stirrups. She is to be non weight bearing with use of crutches. Splint care discussed with patient. She understands the plan.  3. Need for prophylactic vaccination and inoculation against influenza - Flu Vaccine QUAD 36+ mos IM   Mercer Pod, PA-C  [facility] 08/02/2016 10:08 AM

## 2016-08-04 DIAGNOSIS — M25571 Pain in right ankle and joints of right foot: Secondary | ICD-10-CM | POA: Diagnosis not present

## 2016-08-18 DIAGNOSIS — M25572 Pain in left ankle and joints of left foot: Secondary | ICD-10-CM | POA: Diagnosis not present

## 2016-08-18 DIAGNOSIS — M25571 Pain in right ankle and joints of right foot: Secondary | ICD-10-CM | POA: Diagnosis not present

## 2016-08-31 DIAGNOSIS — M25572 Pain in left ankle and joints of left foot: Secondary | ICD-10-CM | POA: Diagnosis not present

## 2016-09-04 DIAGNOSIS — M25572 Pain in left ankle and joints of left foot: Secondary | ICD-10-CM | POA: Diagnosis not present

## 2016-09-05 DIAGNOSIS — M25572 Pain in left ankle and joints of left foot: Secondary | ICD-10-CM | POA: Diagnosis not present

## 2016-09-12 DIAGNOSIS — M25572 Pain in left ankle and joints of left foot: Secondary | ICD-10-CM | POA: Diagnosis not present

## 2016-09-14 DIAGNOSIS — M25572 Pain in left ankle and joints of left foot: Secondary | ICD-10-CM | POA: Diagnosis not present

## 2016-09-15 DIAGNOSIS — M25572 Pain in left ankle and joints of left foot: Secondary | ICD-10-CM | POA: Diagnosis not present

## 2016-09-21 ENCOUNTER — Ambulatory Visit (INDEPENDENT_AMBULATORY_CARE_PROVIDER_SITE_OTHER): Payer: BLUE CROSS/BLUE SHIELD | Admitting: Physician Assistant

## 2016-09-21 VITALS — BP 104/70 | HR 68 | Temp 98.5°F | Resp 16 | Ht 60.0 in | Wt 127.2 lb

## 2016-09-21 DIAGNOSIS — F988 Other specified behavioral and emotional disorders with onset usually occurring in childhood and adolescence: Secondary | ICD-10-CM

## 2016-09-21 DIAGNOSIS — N952 Postmenopausal atrophic vaginitis: Secondary | ICD-10-CM | POA: Diagnosis not present

## 2016-09-21 DIAGNOSIS — F411 Generalized anxiety disorder: Secondary | ICD-10-CM | POA: Diagnosis not present

## 2016-09-21 DIAGNOSIS — Z79899 Other long term (current) drug therapy: Secondary | ICD-10-CM

## 2016-09-21 MED ORDER — LORAZEPAM 0.5 MG PO TABS: 0.5000 mg | ORAL_TABLET | Freq: Every day | ORAL | 0 refills | Status: AC

## 2016-09-21 MED ORDER — AMPHETAMINE-DEXTROAMPHET ER 30 MG PO CP24: 30.0000 mg | ORAL_CAPSULE | Freq: Every day | ORAL | 0 refills | Status: DC

## 2016-09-21 MED ORDER — ESTRADIOL 10 MCG VA TABS: 10.0000 ug | ORAL_TABLET | VAGINAL | 2 refills | Status: DC

## 2016-09-21 MED ORDER — AMPHETAMINE-DEXTROAMPHETAMINE 10 MG PO TABS: 10.0000 mg | ORAL_TABLET | Freq: Two times a day (BID) | ORAL | 0 refills | Status: DC

## 2016-09-21 MED ORDER — AMPHETAMINE-DEXTROAMPHET ER 30 MG PO CP24: 30.0000 mg | ORAL_CAPSULE | ORAL | 0 refills | Status: DC

## 2016-09-21 MED ORDER — LORAZEPAM 0.5 MG PO TABS: 0.5000 mg | ORAL_TABLET | Freq: Every day | ORAL | 0 refills | Status: AC | PRN

## 2016-09-21 MED ORDER — AMPHETAMINE-DEXTROAMPHET ER 30 MG PO CP24: 30.0000 mg | ORAL_CAPSULE | Freq: Every morning | ORAL | 0 refills | Status: DC

## 2016-09-21 NOTE — Progress Notes (Signed)
Anita Rice  MRN: [id number] DOB: 09-10-1956  PCP: Anita Rice  Subjective:  Pt is a pleasant 60 year old female with a PMH ADD, anxiety, vaginal atrophy, alcoholic peripheral neuropathy, thrombocytosis, macrocytosis who presents to clinic for medication refill.  Her PCP had a change in specialty and is now in sleep study. She would like to establish care at [facility]. Today she needs refill of a few of her medications.   Adderall XR 30 mg - She has been taking this for "a really long time". She has been out for several months and she is having a hard time at work. 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 She feels very unorganized and cannot stay on task. She normally takes this daily  Lorazepam 0.70m - History of anxiety. Takes every morning on the weekdays.  Vagifem - Normally takes this once a week. Reports vaginal dryness and itching.    Review of Systems  Constitutional: Negative for chills, diaphoresis, fatigue and fever.  Respiratory: Negative for cough, chest tightness, shortness of breath and wheezing.   Cardiovascular: Negative for chest pain and palpitations.  Gastrointestinal: Negative for abdominal pain, diarrhea, nausea and vomiting.  Genitourinary: Positive for vaginal pain.  Neurological: Negative for light-headedness and headaches.  Psychiatric/Behavioral: Positive for decreased concentration. Negative for agitation, behavioral problems, sleep disturbance and suicidal ideas. The patient is nervous/anxious.     Patient Active Problem List   Diagnosis Date Noted  . Macrocytosis without anemia 06/01/2014  . Thrombocytosis ([place] 06/01/2014  . Alcoholic peripheral neuropathy ([place] 06/01/2014    Current Outpatient Prescriptions on File Prior to Visit  Medication Sig Dispense Refill  . albuterol (PROVENTIL HFA;VENTOLIN HFA) 108 (90 BASE) MCG/ACT inhaler Inhale into the lungs every 6 (six) hours as needed for wheezing or shortness of breath.    .  amphetamine-dextroamphetamine (ADDERALL XR) 30 MG 24 hr capsule Take 30 mg by mouth every morning.     . budesonide-formoterol (SYMBICORT) 160-4.5 MCG/ACT inhaler Inhale 2 puffs into the lungs every morning.     . desoximetasone (TOPICORT) 0.25 % cream Apply 1 application topically once as needed (psoriasis).     . Estradiol (VAGIFEM) 10 MCG TABS vaginal tablet Place vaginally 2 (two) times a week.    . Ferrous Sulfate (IRON) 28 MG TABS Take 1 tablet by mouth every morning.     . fexofenadine (ALLEGRA) 180 MG tablet Take 180 mg by mouth every morning.     . fluticasone (FLONASE) 50 MCG/ACT nasal spray Place 1 spray into both nostrils every morning.     .Marland Kitchenibuprofen (ADVIL,MOTRIN) 200 MG tablet Take 400 mg by mouth every 6 (six) hours as needed for headache or mild pain.     .Marland KitchenLORazepam (ATIVAN) 0.5 MG tablet Take 0.5 mg by mouth daily as needed for anxiety.    . Multiple Vitamins-Minerals (CENTRUM SILVER PO) Take 1 tablet by mouth every morning.      No current facility-administered medications on file prior to visit.     Allergies  Allergen Reactions  . Benadryl [Diphenhydramine] Itching  . Neosporin [Neomycin-Bacitracin Zn-Polymyx] Rash     Objective:  BP 104/70 (BP Location: Right Arm, Patient Position: Sitting, Cuff Size: Normal)   Pulse 68   Temp 98.5 F (36.9 C) (Oral)   Resp 16   Ht 5' (1.524 m)   Wt 127 lb 3.2 oz (57.7 kg)   SpO2 100%   BMI 24.84 kg/m   Physical Exam  Constitutional: She is  oriented to person, place, and time and well-developed, well-nourished, and in no distress. No distress.  Cardiovascular: Normal rate, regular rhythm and normal heart sounds.   Pulmonary/Chest: Effort normal. No respiratory distress.  Neurological: She is alert and oriented to person, place, and time. GCS score is 15.  Skin: Skin is warm and dry.  Psychiatric: Mood, memory, affect and judgment normal.  Vitals reviewed.   Assessment and Plan :  1. Encounter for medication  management - OK to fill three months worth of medications. Encouraged patient to make appointment for annual exam as she is overdue and in need of a PCP. She plans to make appointment for next month.  2. Vaginal atrophy - Estradiol (VAGIFEM) 10 MCG TABS vaginal tablet; Place 1 tablet (10 mcg total) vaginally 2 (two) times a week.  Dispense: 8 tablet; Refill: 2  3. Generalized anxiety disorder - LORazepam (ATIVAN) 0.5 MG tablet; Take 1 tablet (0.5 mg total) by mouth daily as needed for anxiety.  Dispense: 30 tablet; Refill: 0 - LORazepam (ATIVAN) 0.5 MG tablet; Take 1 tablet (0.5 mg total) by mouth daily.  Dispense: 30 tablet; Refill: 0 - LORazepam (ATIVAN) 0.5 MG tablet; Take 1 tablet (0.5 mg total) by mouth daily.  Dispense: 30 tablet; Refill: 0  4. Attention deficit disorder, unspecified hyperactivity presence - amphetamine-dextroamphetamine (ADDERALL) 10 MG tablet; Take 1 tablet (10 mg total) by mouth 2 (two) times daily.  Dispense: 60 tablet; Refill: 0 - amphetamine-dextroamphetamine (ADDERALL XR) 30 MG 24 hr capsule; Take 1 capsule (30 mg total) by mouth every morning.  Dispense: 30 capsule; Refill: 0 - amphetamine-dextroamphetamine (ADDERALL XR) 30 MG 24 hr capsule; Take 1 capsule (30 mg total) by mouth daily.  Dispense: 30 capsule; Refill: 0 - amphetamine-dextroamphetamine (ADDERALL XR) 30 MG 24 hr capsule; Take 1 capsule (30 mg total) by mouth every morning.  Dispense: 30 capsule; Refill: 0  Anita Pod, PA-C  [facility] 09/21/2016 5:11 PM

## 2016-09-21 NOTE — Patient Instructions (Addendum)
Please schedule an annual exam for Jan 2018 Health Maintenance, Female Introduction Adopting a healthy lifestyle and getting preventive care can go a long way to promote health and wellness. Talk with your health care provider about what schedule of regular examinations is right for you. This is a good chance for you to check in with your provider about disease prevention and staying healthy. In between checkups, there are plenty of things you can do on your own. Experts have done a lot of research about which lifestyle changes and preventive measures are most likely to keep you healthy. Ask your health care provider for more information. Weight and diet Eat a healthy diet  Be sure to include plenty of vegetables, fruits, low-fat dairy products, and lean protein.  Do not eat a lot of foods high in solid fats, added sugars, or salt.  Get regular exercise. This is one of the most important things you can do for your health.  Most adults should exercise for at least 150 minutes each week. The exercise should increase your heart rate and make you sweat (moderate-intensity exercise).  Most adults should also do strengthening exercises at least twice a week. This is in addition to the moderate-intensity exercise. Maintain a healthy weight  Body mass index (BMI) is a measurement that can be used to identify possible weight problems. It estimates body fat based on height and weight. Your health care provider can help determine your BMI and help you achieve or maintain a healthy weight.  For females 39 years of age and older:  A BMI below 18.5 is considered underweight.  A BMI of 18.5 to 24.9 is normal.  A BMI of 25 to 29.9 is considered overweight.  A BMI of 30 and above is considered obese. Watch levels of cholesterol and blood lipids  You should start having your blood tested for lipids and cholesterol at 60 years of age, then have this test every 5 years.  You may need to have your  cholesterol levels checked more often if:  Your lipid or cholesterol levels are high.  You are older than 60 years of age.  You are at high risk for heart disease. Cancer screening Lung Cancer  Lung cancer screening is recommended for adults 62-41 years old who are at high risk for lung cancer because of a history of smoking.  A yearly low-dose CT scan of the lungs is recommended for people who:  Currently smoke.  Have quit within the past 15 years.  Have at least a 30-pack-year history of smoking. A pack year is smoking an average of one pack of cigarettes a day for 1 year.  Yearly screening should continue until it has been 15 years since you quit.  Yearly screening should stop if you develop a health problem that would prevent you from having lung cancer treatment. Breast Cancer  Practice breast self-awareness. This means understanding how your breasts normally appear and feel.  It also means doing regular breast self-exams. Let your health care provider know about any changes, no matter how small.  If you are in your 20s or 30s, you should have a clinical breast exam (CBE) by a health care provider every 1-3 years as part of a regular health exam.  If you are 74 or older, have a CBE every year. Also consider having a breast X-ray (mammogram) every year.  If you have a family history of breast cancer, talk to your health care provider about genetic screening.  If  you are at high risk for breast cancer, talk to your health care provider about having an MRI and a mammogram every year.  Breast cancer gene (BRCA) assessment is recommended for women who have family members with BRCA-related cancers. BRCA-related cancers include:  Breast.  Ovarian.  Tubal.  Peritoneal cancers.  Results of the assessment will determine the need for genetic counseling and BRCA1 and BRCA2 testing. Cervical Cancer  Your health care provider may recommend that you be screened regularly for  cancer of the pelvic organs (ovaries, uterus, and vagina). This screening involves a pelvic examination, including checking for microscopic changes to the surface of your cervix (Pap test). You may be encouraged to have this screening done every 3 years, beginning at age 72.  For women ages 38-65, health care providers may recommend pelvic exams and Pap testing every 3 years, or they may recommend the Pap and pelvic exam, combined with testing for human papilloma virus (HPV), every 5 years. Some types of HPV increase your risk of cervical cancer. Testing for HPV may also be done on women of any age with unclear Pap test results.  Other health care providers may not recommend any screening for nonpregnant women who are considered low risk for pelvic cancer and who do not have symptoms. Ask your health care provider if a screening pelvic exam is right for you.  If you have had past treatment for cervical cancer or a condition that could lead to cancer, you need Pap tests and screening for cancer for at least 20 years after your treatment. If Pap tests have been discontinued, your risk factors (such as having a new sexual partner) need to be reassessed to determine if screening should resume. Some women have medical problems that increase the chance of getting cervical cancer. In these cases, your health care provider may recommend more frequent screening and Pap tests. Colorectal Cancer  This type of cancer can be detected and often prevented.  Routine colorectal cancer screening usually begins at 60 years of age and continues through 60 years of age.  Your health care provider may recommend screening at an earlier age if you have risk factors for colon cancer.  Your health care provider may also recommend using home test kits to check for hidden blood in the stool.  A small camera at the end of a tube can be used to examine your colon directly (sigmoidoscopy or colonoscopy). This is done to check for  the earliest forms of colorectal cancer.  Routine screening usually begins at age 3.  Direct examination of the colon should be repeated every 5-10 years through 60 years of age. However, you may need to be screened more often if early forms of precancerous polyps or small growths are found. Skin Cancer  Check your skin from head to toe regularly.  Tell your health care provider about any new moles or changes in moles, especially if there is a change in a mole's shape or color.  Also tell your health care provider if you have a mole that is larger than the size of a pencil eraser.  Always use sunscreen. Apply sunscreen liberally and repeatedly throughout the day.  Protect yourself by wearing long sleeves, pants, a wide-brimmed hat, and sunglasses whenever you are outside. Heart disease, diabetes, and high blood pressure  High blood pressure causes heart disease and increases the risk of stroke. High blood pressure is more likely to develop in:  People who have blood pressure in the high  end of the normal range (130-139/85-89 mm Hg).  People who are overweight or obese.  People who are African American.  If you are 63-43 years of age, have your blood pressure checked every 3-5 years. If you are 65 years of age or older, have your blood pressure checked every year. You should have your blood pressure measured twice-once when you are at a hospital or clinic, and once when you are not at a hospital or clinic. Record the average of the two measurements. To check your blood pressure when you are not at a hospital or clinic, you can use:  An automated blood pressure machine at a pharmacy.  A home blood pressure monitor.  If you are between 19 years and 71 years old, ask your health care provider if you should take aspirin to prevent strokes.  Have regular diabetes screenings. This involves taking a blood sample to check your fasting blood sugar level.  If you are at a normal weight and  have a low risk for diabetes, have this test once every three years after 60 years of age.  If you are overweight and have a high risk for diabetes, consider being tested at a younger age or more often. Preventing infection Hepatitis B  If you have a higher risk for hepatitis B, you should be screened for this virus. You are considered at high risk for hepatitis B if:  You were born in a country where hepatitis B is common. Ask your health care provider which countries are considered high risk.  Your parents were born in a high-risk country, and you have not been immunized against hepatitis B (hepatitis B vaccine).  You have HIV or AIDS.  You use needles to inject street drugs.  You live with someone who has hepatitis B.  You have had sex with someone who has hepatitis B.  You get hemodialysis treatment.  You take certain medicines for conditions, including cancer, organ transplantation, and autoimmune conditions. Hepatitis C  Blood testing is recommended for:  Everyone born from [birth year] through 1965.  Anyone with known risk factors for hepatitis C. Sexually transmitted infections (STIs)  You should be screened for sexually transmitted infections (STIs) including gonorrhea and chlamydia if:  You are sexually active and are younger than 60 years of age.  You are older than 60 years of age and your health care provider tells you that you are at risk for this type of infection.  Your sexual activity has changed since you were last screened and you are at an increased risk for chlamydia or gonorrhea. Ask your health care provider if you are at risk.  If you do not have HIV, but are at risk, it may be recommended that you take a prescription medicine daily to prevent HIV infection. This is called pre-exposure prophylaxis (PrEP). You are considered at risk if:  You are sexually active and do not regularly use condoms or know the HIV status of your partner(s).  You take drugs by  injection.  You are sexually active with a partner who has HIV. Talk with your health care provider about whether you are at high risk of being infected with HIV. If you choose to begin PrEP, you should first be tested for HIV. You should then be tested every 3 months for as long as you are taking PrEP. Pregnancy  If you are premenopausal and you may become pregnant, ask your health care provider about preconception counseling.  If you may become pregnant,  take 400 to 800 micrograms (mcg) of folic acid every day.  If you want to prevent pregnancy, talk to your health care provider about birth control (contraception). Osteoporosis and menopause  Osteoporosis is a disease in which the bones lose minerals and strength with aging. This can result in serious bone fractures. Your risk for osteoporosis can be identified using a bone density scan.  If you are 59 years of age or older, or if you are at risk for osteoporosis and fractures, ask your health care provider if you should be screened.  Ask your health care provider whether you should take a calcium or vitamin D supplement to lower your risk for osteoporosis.  Menopause may have certain physical symptoms and risks.  Hormone replacement therapy may reduce some of these symptoms and risks. Talk to your health care provider about whether hormone replacement therapy is right for you. Follow these instructions at home:  Schedule regular health, dental, and eye exams.  Stay current with your immunizations.  Do not use any tobacco products including cigarettes, chewing tobacco, or electronic cigarettes.  If you are pregnant, do not drink alcohol.  If you are breastfeeding, limit how much and how often you drink alcohol.  Limit alcohol intake to no more than 1 drink per day for nonpregnant women. One drink equals 12 ounces of beer, 5 ounces of wine, or 1 ounces of hard liquor.  Do not use street drugs.  Do not share needles.  Ask  your health care provider for help if you need support or information about quitting drugs.  Tell your health care provider if you often feel depressed.  Tell your health care provider if you have ever been abused or do not feel safe at home. This information is not intended to replace advice given to you by your health care provider. Make sure you discuss any questions you have with your health care provider. Document Released: 04/17/2011 Document Revised: 03/09/2016 Document Reviewed: 07/06/2015  2017 Elsevier    IF you received an x-ray today, you will receive an invoice from [facility]. Please contact [facility] at [phone] with questions or concerns regarding your invoice.   IF you received labwork today, you will receive an invoice from [employer]. Please contact [facility] at [phone] with questions or concerns regarding your invoice.   Our billing staff will not be able to assist you with questions regarding bills from these companies.  You will be contacted with the lab results as soon as they are available. The fastest way to get your results is to activate your My Chart account. Instructions are located on the last page of this paperwork. If you have not heard from [place] regarding the results in 2 weeks, please contact this office.

## 2016-09-27 ENCOUNTER — Other Ambulatory Visit: Payer: Self-pay | Admitting: Physician Assistant

## 2016-10-19 DIAGNOSIS — L659 Nonscarring hair loss, unspecified: Secondary | ICD-10-CM | POA: Diagnosis not present

## 2016-10-19 DIAGNOSIS — L639 Alopecia areata, unspecified: Secondary | ICD-10-CM | POA: Diagnosis not present

## 2016-11-27 ENCOUNTER — Ambulatory Visit (INDEPENDENT_AMBULATORY_CARE_PROVIDER_SITE_OTHER): Payer: BLUE CROSS/BLUE SHIELD | Admitting: Physician Assistant

## 2016-11-27 ENCOUNTER — Encounter: Payer: Self-pay | Admitting: Physician Assistant

## 2016-11-27 VITALS — BP 126/82 | HR 98 | Temp 98.7°F | Resp 18 | Ht 60.0 in | Wt 121.0 lb

## 2016-11-27 DIAGNOSIS — R062 Wheezing: Secondary | ICD-10-CM | POA: Diagnosis not present

## 2016-11-27 DIAGNOSIS — J45909 Unspecified asthma, uncomplicated: Secondary | ICD-10-CM

## 2016-11-27 DIAGNOSIS — B349 Viral infection, unspecified: Secondary | ICD-10-CM | POA: Diagnosis not present

## 2016-11-27 DIAGNOSIS — R0981 Nasal congestion: Secondary | ICD-10-CM | POA: Diagnosis not present

## 2016-11-27 MED ORDER — BUDESONIDE-FORMOTEROL FUMARATE 160-4.5 MCG/ACT IN AERO: 2.0000 | INHALATION_SPRAY | Freq: Every morning | RESPIRATORY_TRACT | 6 refills | Status: DC

## 2016-11-27 MED ORDER — ALBUTEROL SULFATE (2.5 MG/3ML) 0.083% IN NEBU
2.5000 mg | INHALATION_SOLUTION | Freq: Once | RESPIRATORY_TRACT | Status: AC
  Administered 2016-11-27: 2.5 mg via RESPIRATORY_TRACT

## 2016-11-27 MED ORDER — ALBUTEROL SULFATE HFA 108 (90 BASE) MCG/ACT IN AERS: 2.0000 | INHALATION_SPRAY | Freq: Four times a day (QID) | RESPIRATORY_TRACT | 3 refills | Status: DC | PRN

## 2016-11-27 MED ORDER — IPRATROPIUM BROMIDE 0.02 % IN SOLN
0.5000 mg | Freq: Once | RESPIRATORY_TRACT | Status: AC
  Administered 2016-11-27: 0.5 mg via RESPIRATORY_TRACT

## 2016-11-27 MED ORDER — MUCINEX DM MAXIMUM STRENGTH 60-1200 MG PO TB12: 1.0000 | ORAL_TABLET | Freq: Two times a day (BID) | ORAL | 1 refills | Status: DC

## 2016-11-27 MED ORDER — PREDNISONE 50 MG PO TABS: ORAL_TABLET | ORAL | 0 refills | Status: DC

## 2016-11-27 NOTE — Progress Notes (Signed)
Anita Rice  MRN: [id number] DOB: 19-May-1956  PCP: Gelene Mink Novia Lansberry, PA-C  Subjective:  Pt is a 61 year old female who presents to clinic for cough, congestion and wheezing.  +cough, sinus pressure, sneezing and runny nose x 5 days. She is using Albuterol every 4 hours over the past few days. C/o chest tightness and wheezing. Not improving. She is sleeping well at night.  H/o asthma - Controlled. Symbicort qd, flonase qd, nasal saline. She normally uses Albuterol every few months. Needs refill of Symbicort and Albuterol.  Has not tried anything over-the-counter.  Denies fever, chills, nausea, vomiting, sore throat, chest pain, palpitations.   Review of Systems  Constitutional: Negative for chills, diaphoresis, fatigue and fever.  HENT: Positive for rhinorrhea, sinus pressure and sneezing. Negative for congestion, postnasal drip and sore throat.   Respiratory: Positive for chest tightness and wheezing. Negative for cough and shortness of breath.   Cardiovascular: Negative for chest pain and palpitations.  Gastrointestinal: Negative for abdominal pain, diarrhea, nausea and vomiting.  Neurological: Negative for weakness, light-headedness and headaches.    Patient Active Problem List   Diagnosis Date Noted  . Macrocytosis without anemia 06/01/2014  . Thrombocytosis ([place]) 06/01/2014  . Alcoholic peripheral neuropathy ([place]) 06/01/2014    Current Outpatient Prescriptions on File Prior to Visit  Medication Sig Dispense Refill  . albuterol (PROVENTIL HFA;VENTOLIN HFA) 108 (90 BASE) MCG/ACT inhaler Inhale into the lungs every 6 (six) hours as needed for wheezing or shortness of breath.    . amphetamine-dextroamphetamine (ADDERALL XR) 30 MG 24 hr capsule Take 1 capsule (30 mg total) by mouth every morning. 30 capsule 0  . amphetamine-dextroamphetamine (ADDERALL) 10 MG tablet Take 1 tablet (10 mg total) by mouth 2 (two) times daily. 60 tablet 0  . budesonide-formoterol (SYMBICORT)  160-4.5 MCG/ACT inhaler Inhale 2 puffs into the lungs every morning.     . desoximetasone (TOPICORT) 0.25 % cream Apply 1 application topically once as needed (psoriasis).     . Estradiol (VAGIFEM) 10 MCG TABS vaginal tablet Place 1 tablet (10 mcg total) vaginally 2 (two) times a week. 8 tablet 2  . Ferrous Sulfate (IRON) 28 MG TABS Take 1 tablet by mouth every morning.     . fexofenadine (ALLEGRA) 180 MG tablet TAKE 1 TABLET EVERY DAY 30 tablet 2  . fluticasone (FLONASE) 50 MCG/ACT nasal spray Place 1 spray into both nostrils every morning.     Marland Kitchen ibuprofen (ADVIL,MOTRIN) 200 MG tablet Take 400 mg by mouth every 6 (six) hours as needed for headache or mild pain.     Marland Kitchen LORazepam (ATIVAN) 0.5 MG tablet Take 1 tablet (0.5 mg total) by mouth daily. 30 tablet 0  . Multiple Vitamins-Minerals (CENTRUM SILVER PO) Take 1 tablet by mouth every morning.     Marland Kitchen amphetamine-dextroamphetamine (ADDERALL XR) 30 MG 24 hr capsule Take 1 capsule (30 mg total) by mouth every morning. 30 capsule 0  . amphetamine-dextroamphetamine (ADDERALL XR) 30 MG 24 hr capsule Take 1 capsule (30 mg total) by mouth daily. 30 capsule 0   No current facility-administered medications on file prior to visit.     Allergies  Allergen Reactions  . Benadryl [Diphenhydramine] Itching  . Neosporin [Neomycin-Bacitracin Zn-Polymyx] Rash     Objective:  BP 126/82 (BP Location: Right Arm, Patient Position: Sitting, Cuff Size: Small)   Pulse 98   Temp 98.7 F (37.1 C) (Oral)   Resp 18   Ht 5' (1.524 m)   Wt 121 lb (  54.9 kg)   SpO2 100%   BMI 23.63 kg/m   Physical Exam  Constitutional: She is oriented to person, place, and time and well-developed, well-nourished, and in no distress. No distress.  Cardiovascular: Normal rate, regular rhythm and normal heart sounds.   Pulmonary/Chest: Effort normal. She has wheezes in the right upper field and the left upper field. She has no rhonchi. She has no rales.  Neurological: She is alert and  oriented to person, place, and time. GCS score is 15.  Skin: Skin is warm and dry.  Psychiatric: Mood, memory, affect and judgment normal.  Vitals reviewed.   Assessment and Plan :  1. Wheezing 2. Viral illness 3. Nasal congestion - albuterol (PROVENTIL) (2.5 MG/3ML) 0.083% nebulizer solution 2.5 mg; Take 3 mLs (2.5 mg total) by nebulization once. - ipratropium (ATROVENT) nebulizer solution 0.5 mg; Take 2.5 mLs (0.5 mg total) by nebulization once. - predniSONE (DELTASONE) 50 MG tablet; Once daily x 7 days  Dispense: 7 tablet; Refill: 0 - Dextromethorphan-Guaifenesin (MUCINEX DM MAXIMUM STRENGTH) 60-1200 MG TB12; Take 1 tablet by mouth every 12 (twelve) hours.  Dispense: 14 each; Refill: 1 - Suspect viral illness with asthma exacerbation. She reports improvement of chest tightness after breathing treatment. RTC in 5-7 days if no improvement of symptoms.   4. Uncomplicated asthma, unspecified asthma severity, unspecified whether persistent - albuterol (PROVENTIL HFA;VENTOLIN HFA) 108 (90 Base) MCG/ACT inhaler; Inhale 2 puffs into the lungs every 6 (six) hours as needed for wheezing or shortness of breath.  Dispense: 1 Inhaler; Refill: 3 - budesonide-formoterol (SYMBICORT) 160-4.5 MCG/ACT inhaler; Inhale 2 puffs into the lungs every morning.  Dispense: 1 Inhaler; Refill: 6   Whitney Shataria Crist, PA-C  [facility] [place] 11/27/2016 11:15 AM

## 2016-11-28 ENCOUNTER — Telehealth: Payer: Self-pay

## 2016-11-28 NOTE — Telephone Encounter (Signed)
Pt has questions about her prednizone  rx she thought that it was going to be a tapered pack but it is 5 pills for 5 days   Best number [phone]

## 2016-11-28 NOTE — Telephone Encounter (Signed)
L/m reviewed note and rx it is 1 pill daily x 7 days

## 2016-12-16 ENCOUNTER — Other Ambulatory Visit: Payer: Self-pay | Admitting: Physician Assistant

## 2016-12-18 ENCOUNTER — Other Ambulatory Visit: Payer: Self-pay

## 2016-12-18 MED ORDER — FLUTICASONE PROPIONATE 50 MCG/ACT NA SUSP: NASAL | 4 refills | Status: DC

## 2017-01-17 ENCOUNTER — Telehealth: Payer: Self-pay | Admitting: Family Medicine

## 2017-01-17 NOTE — Telephone Encounter (Signed)
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

## 2017-01-18 ENCOUNTER — Other Ambulatory Visit: Payer: Self-pay | Admitting: Physician Assistant

## 2017-01-20 ENCOUNTER — Telehealth: Payer: Self-pay

## 2017-01-20 ENCOUNTER — Telehealth: Payer: Self-pay | Admitting: Physician Assistant

## 2017-01-20 DIAGNOSIS — F988 Other specified behavioral and emotional disorders with onset usually occurring in childhood and adolescence: Secondary | ICD-10-CM

## 2017-01-20 NOTE — Telephone Encounter (Signed)
Pt states that she has left several messages about getting her adderall filled and this is the first time I see anything in the system   Best number [phone]

## 2017-01-20 NOTE — Telephone Encounter (Signed)
See next note

## 2017-01-20 NOTE — Telephone Encounter (Signed)
Phone message from pt for refill Adderall and Lorazepam

## 2017-01-20 NOTE — Telephone Encounter (Signed)
She needs OV for refill. Thank you

## 2017-01-22 ENCOUNTER — Other Ambulatory Visit: Payer: Self-pay | Admitting: Physician Assistant

## 2017-01-22 DIAGNOSIS — F411 Generalized anxiety disorder: Secondary | ICD-10-CM

## 2017-01-22 DIAGNOSIS — F988 Other specified behavioral and emotional disorders with onset usually occurring in childhood and adolescence: Secondary | ICD-10-CM

## 2017-01-22 DIAGNOSIS — J45909 Unspecified asthma, uncomplicated: Secondary | ICD-10-CM

## 2017-01-22 MED ORDER — AMPHETAMINE-DEXTROAMPHET ER 30 MG PO CP24: 30.0000 mg | ORAL_CAPSULE | Freq: Every morning | ORAL | 0 refills | Status: DC

## 2017-01-22 MED ORDER — ALPRAZOLAM 0.5 MG PO TABS: 0.5000 mg | ORAL_TABLET | Freq: Every evening | ORAL | 0 refills | Status: DC | PRN

## 2017-01-22 NOTE — Telephone Encounter (Signed)
Pt advised, she is very upset because she left a message last week when she was off work/school, now shes back and cannot come in for a few weeks. Asks to please get a one month refill and she will come in before runs out. See previous messages last week, bounced around providers?

## 2017-01-22 NOTE — Telephone Encounter (Signed)
Pt picked up.

## 2017-01-22 NOTE — Telephone Encounter (Signed)
Pls call pt and let her know I will refill 1 month of medication. She can pick it up here.  As discussed at her last OV, she needs to make appt for refill.  Thank you!

## 2017-01-23 ENCOUNTER — Other Ambulatory Visit: Payer: Self-pay | Admitting: Physician Assistant

## 2017-01-23 ENCOUNTER — Telehealth: Payer: Self-pay | Admitting: Emergency Medicine

## 2017-01-23 ENCOUNTER — Other Ambulatory Visit: Payer: Self-pay | Admitting: Emergency Medicine

## 2017-01-23 DIAGNOSIS — F39 Unspecified mood [affective] disorder: Secondary | ICD-10-CM

## 2017-01-23 MED ORDER — LORAZEPAM 0.5 MG PO TABS: 0.5000 mg | ORAL_TABLET | Freq: Two times a day (BID) | ORAL | 1 refills | Status: DC | PRN

## 2017-01-23 MED ORDER — AMPHETAMINE-DEXTROAMPHETAMINE 10 MG PO TABS: 10.0000 mg | ORAL_TABLET | Freq: Two times a day (BID) | ORAL | 0 refills | Status: DC

## 2017-01-23 MED ORDER — LORAZEPAM 0.5 MG PO TABS: 0.5000 mg | ORAL_TABLET | Freq: Every day | ORAL | 0 refills | Status: DC

## 2017-01-23 NOTE — Progress Notes (Addendum)
Adderall 10 mg tab script ready for pick up Pt made aware and will be in either this evening or tomorrow

## 2017-01-23 NOTE — Telephone Encounter (Signed)
Pt called in very upset script received on yesterday was Alprazolam instead of Lorazepam.  States, she never took Lorazepam before and has no desire to start.  After reviewing last OV with provider, she discussed taking Lorazepam.  Patient will return hard script today to shred and correct script called in to [facility].  Pt is very upset that she did not receive script for Adderall 66m for weekend use/ as needed. [facility]

## 2017-01-23 NOTE — Telephone Encounter (Signed)
My apologies. Pls call pt and have her pick up Rx at front desk.

## 2017-01-23 NOTE — Telephone Encounter (Signed)
Pt called per jill

## 2017-02-24 ENCOUNTER — Ambulatory Visit (INDEPENDENT_AMBULATORY_CARE_PROVIDER_SITE_OTHER): Payer: BLUE CROSS/BLUE SHIELD | Admitting: Physician Assistant

## 2017-02-24 ENCOUNTER — Encounter: Payer: Self-pay | Admitting: Physician Assistant

## 2017-02-24 VITALS — BP 103/66 | HR 85 | Temp 97.4°F | Resp 18 | Ht 60.0 in | Wt 116.1 lb

## 2017-02-24 DIAGNOSIS — F39 Unspecified mood [affective] disorder: Secondary | ICD-10-CM | POA: Diagnosis not present

## 2017-02-24 DIAGNOSIS — F988 Other specified behavioral and emotional disorders with onset usually occurring in childhood and adolescence: Secondary | ICD-10-CM | POA: Diagnosis not present

## 2017-02-24 DIAGNOSIS — Z79899 Other long term (current) drug therapy: Secondary | ICD-10-CM | POA: Diagnosis not present

## 2017-02-24 MED ORDER — AMPHETAMINE-DEXTROAMPHETAMINE 10 MG PO TABS: 20.0000 mg | ORAL_TABLET | Freq: Every day | ORAL | 0 refills | Status: DC

## 2017-02-24 MED ORDER — LORAZEPAM 0.5 MG PO TABS: 0.5000 mg | ORAL_TABLET | Freq: Two times a day (BID) | ORAL | 1 refills | Status: AC | PRN

## 2017-02-24 MED ORDER — LORAZEPAM 0.5 MG PO TABS: 0.5000 mg | ORAL_TABLET | Freq: Every day | ORAL | 0 refills | Status: DC

## 2017-02-24 MED ORDER — AMPHETAMINE-DEXTROAMPHETAMINE 10 MG PO TABS
20.0000 mg | ORAL_TABLET | Freq: Every day | ORAL | 0 refills | Status: DC
Start: 2017-04-26 — End: 2017-03-17

## 2017-02-24 MED ORDER — AMPHETAMINE-DEXTROAMPHETAMINE 10 MG PO TABS: 20.0000 mg | ORAL_TABLET | Freq: Two times a day (BID) | ORAL | 0 refills | Status: DC

## 2017-02-24 MED ORDER — LORAZEPAM 0.5 MG PO TABS: 0.5000 mg | ORAL_TABLET | Freq: Two times a day (BID) | ORAL | 1 refills | Status: DC | PRN

## 2017-02-24 NOTE — Patient Instructions (Addendum)
  Thank you for coming in today. I hope you feel we met your needs.  Feel free to call [facility] if you have any questions or further requests.  Please consider signing up for MyChart if you do not already have it, as this is a great way to communicate with me.  Best,  Whitney McVey, PA-C   IF you received an x-ray today, you will receive an invoice from [facility]. Please contact [facility] at [phone] with questions or concerns regarding your invoice.   IF you received labwork today, you will receive an invoice from [facility]. Please contact [facility] at [phone] with questions or concerns regarding your invoice.   Our billing staff will not be able to assist you with questions regarding bills from these companies.  You will be contacted with the lab results as soon as they are available. The fastest way to get your results is to activate your My Chart account. Instructions are located on the last page of this paperwork. If you have not heard from us regarding the results in 2 weeks, please contact this office.      

## 2017-02-24 NOTE — Progress Notes (Signed)
Anita Rice  MRN: [id number] DOB: 10-20-1955  PCP: [name], PA-C  Subjective:  Pt is a 61 year old female who presents to clinic for medication refill of lorazepam and adderall.   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 She feels very unorganized and cannot stay on task. She normally takes this daily  Lorazepam 0.73m - History of anxiety. Takes every morning on the weekdays.   C/o abdominal pain and diarrhea x 3 weeks. She stopped taking her Adderall XR about 1 week ago and diarrhea has improved. She is taking Adderall 240mqam and 1067mn the afternoon. She feels well with this dose. Is effective for her concentration.   Review of Systems  Constitutional: Negative for chills and fever.  Gastrointestinal: Positive for abdominal pain and diarrhea. Negative for nausea and vomiting.  Psychiatric/Behavioral: Positive for behavioral problems.    Patient Active Problem List   Diagnosis Date Noted  . Macrocytosis without anemia 06/01/2014  . Thrombocytosis (HCCShipman8/17/2015  . Alcoholic peripheral neuropathy (HCCRandolph8/17/2015    Current Outpatient Prescriptions on File Prior to Visit  Medication Sig Dispense Refill  . albuterol (PROVENTIL HFA;VENTOLIN HFA) 108 (90 Base) MCG/ACT inhaler Inhale 2 puffs into the lungs every 6 (six) hours as needed for wheezing or shortness of breath. 1 Inhaler 3  . amphetamine-dextroamphetamine (ADDERALL) 10 MG tablet Take 1 tablet (10 mg total) by mouth 2 (two) times daily. 60 tablet 0  . amphetamine-dextroamphetamine (ADDERALL) 10 MG tablet Take 1 tablet (10 mg total) by mouth 2 (two) times daily. 60 tablet 0  . budesonide-formoterol (SYMBICORT) 160-4.5 MCG/ACT inhaler Inhale 2 puffs into the lungs every morning. 1 Inhaler 6  . desoximetasone (TOPICORT) 0.25 % cream Apply 1 application topically once as needed (psoriasis).     . Dextromethorphan-Guaifenesin (MUCINEX DM MAXIMUM STRENGTH) 60-1200 MG TB12 Take  1 tablet by mouth every 12 (twelve) hours. 14 each 1  . Estradiol (VAGIFEM) 10 MCG TABS vaginal tablet Place 1 tablet (10 mcg total) vaginally 2 (two) times a week. 8 tablet 2  . Ferrous Sulfate (IRON) 28 MG TABS Take 1 tablet by mouth every morning.     . fexofenadine (ALLEGRA) 180 MG tablet TAKE 1 TABLET EVERY DAY 30 tablet 2  . fluticasone (FLONASE) 50 MCG/ACT nasal spray USE 2 SPRAYS IN THE NOSE ONCE DAILY AS DIRECTED 48 g 4  . ibuprofen (ADVIL,MOTRIN) 200 MG tablet Take 400 mg by mouth every 6 (six) hours as needed for headache or mild pain.     . LMarland KitchenRazepam (ATIVAN) 0.5 MG tablet Take 1 tablet (0.5 mg total) by mouth daily. Take as needed 30 tablet 0  . LORazepam (ATIVAN) 0.5 MG tablet Take 1 tablet (0.5 mg total) by mouth 2 (two) times daily as needed for anxiety. 30 tablet 1  . Multiple Vitamins-Minerals (CENTRUM SILVER PO) Take 1 tablet by mouth every morning.     . aMarland Kitchenphetamine-dextroamphetamine (ADDERALL XR) 30 MG 24 hr capsule Take 1 capsule (30 mg total) by mouth daily. 30 capsule 0  . amphetamine-dextroamphetamine (ADDERALL XR) 30 MG 24 hr capsule Take 1 capsule (30 mg total) by mouth every morning. 30 capsule 0  . amphetamine-dextroamphetamine (ADDERALL XR) 30 MG 24 hr capsule Take 1 capsule (30 mg total) by mouth every morning. 30 capsule 0  . predniSONE (DELTASONE) 50 MG tablet Once daily x 7 days (Patient not taking: Reported on 02/24/2017) 7 tablet 0   No current facility-administered  medications on file prior to visit.     Allergies  Allergen Reactions  . Benadryl [Diphenhydramine] Itching  . Neosporin [Neomycin-Bacitracin Zn-Polymyx] Rash     Objective:  BP 103/66   Pulse 85   Temp 97.4 F (36.3 C) (Oral)   Resp 18   Ht 5' (1.524 m)   Wt 116 lb 2 oz (52.7 kg)   SpO2 98%   BMI 22.68 kg/m   Physical Exam  Constitutional: She is oriented to person, place, and time and well-developed, well-nourished, and in no distress. No distress.  Cardiovascular: Normal rate,  regular rhythm and normal heart sounds.   Abdominal: There is generalized tenderness (mild).  Neurological: She is alert and oriented to person, place, and time. GCS score is 15.  Skin: Skin is warm and dry.  Psychiatric: Mood, memory, affect and judgment normal.  Vitals reviewed.   Assessment and Plan :  1. Encounter for medication management 2. Attention deficit disorder, unspecified hyperactivity presence - amphetamine-dextroamphetamine (ADDERALL) 10 MG tablet; Take 2 tablets (20 mg total) by mouth daily with breakfast.  Dispense: 90 tablet; Refill: 0 - amphetamine-dextroamphetamine (ADDERALL) 10 MG tablet; Take 2 tablets (20 mg total) by mouth daily with breakfast.  Dispense: 90 tablet; Refill: 0 - Pt c/o recent diarrhea and abdominal pain. She stopped Adderall XR, improvement on IR. OK to refill 3 months of medications. Will d/c XR due to GI complaints. RTC if symptoms do not improve. Consider stool culture. She agrees with plan.  3. Mood disorder (HCC) - LORazepam (ATIVAN) 0.5 MG tablet; Take 1 tablet (0.5 mg total) by mouth daily. Take as needed  Dispense: 30 tablet; Refill: 0 - LORazepam (ATIVAN) 0.5 MG tablet; Take 1 tablet (0.5 mg total) by mouth 2 (two) times daily as needed for anxiety.  Dispense: 30 tablet; Refill: 1 - LORazepam (ATIVAN) 0.5 MG tablet; Take 1 tablet (0.5 mg total) by mouth 2 (two) times daily as needed for anxiety.  Dispense: 30 tablet; Refill: 1   Whitney Lomax Poehler, PA-C  Primary Care at [place] 02/24/2017 11:20 AM

## 2017-03-03 ENCOUNTER — Telehealth: Payer: Self-pay | Admitting: Family Medicine

## 2017-03-03 NOTE — Telephone Encounter (Signed)
Please see note and advise

## 2017-03-03 NOTE — Telephone Encounter (Signed)
im so confused by what the pt wants she was a little upset about the fact Anita Rice has gotten her RX wrong again so pt says   She says that the RX for Adderal should say take 3 10 mg tab a day for 30 days cause she was on 7m daily  She also want it in generic form called Amphetamine Salt

## 2017-03-05 NOTE — Telephone Encounter (Signed)
PA McVey saw this patient, not PA Alfarata. Thanks!

## 2017-03-10 NOTE — Telephone Encounter (Signed)
Patient was upset said her prescription is not correct, she did not want to make an appointment with you on Wednesday.   She would like you to call her.  She states she will be out of medication by beginning of the week. See previous note.

## 2017-03-15 ENCOUNTER — Telehealth: Payer: Self-pay | Admitting: Physician Assistant

## 2017-03-15 ENCOUNTER — Other Ambulatory Visit: Payer: Self-pay | Admitting: Physician Assistant

## 2017-03-15 DIAGNOSIS — R41840 Attention and concentration deficit: Secondary | ICD-10-CM

## 2017-03-15 NOTE — Telephone Encounter (Signed)
Please call pt and let her know she should have enough medication for how she is taking it. I filled ADDERALL 10 mg dispense: 90 pills -- That is equivalent to 3 pills/day. She has a refill for next month.  I am unfamiliar with the formulation she is requesting. I will refer her to [facility], as they are more familiar with different Adderall formularies.  Thank you!

## 2017-03-15 NOTE — Telephone Encounter (Signed)
Adv pt note left from [place], pt states that is incorrect and she will just make an appt to bring the incorrect Rx order in.  Pt also request that her co-pay be waive, adv pt that could not happen. We have to charge a co-pay per her insurance.

## 2017-03-15 NOTE — Progress Notes (Signed)
Adderal should say take 3 10 mg tab a day for 30 days.

## 2017-03-17 ENCOUNTER — Encounter: Payer: Self-pay | Admitting: Physician Assistant

## 2017-03-17 ENCOUNTER — Ambulatory Visit (INDEPENDENT_AMBULATORY_CARE_PROVIDER_SITE_OTHER): Payer: BLUE CROSS/BLUE SHIELD | Admitting: Physician Assistant

## 2017-03-17 VITALS — BP 105/70 | HR 90 | Temp 98.6°F | Resp 16 | Ht 60.0 in | Wt 116.6 lb

## 2017-03-17 DIAGNOSIS — F988 Other specified behavioral and emotional disorders with onset usually occurring in childhood and adolescence: Secondary | ICD-10-CM | POA: Diagnosis not present

## 2017-03-17 DIAGNOSIS — Z79899 Other long term (current) drug therapy: Secondary | ICD-10-CM

## 2017-03-17 MED ORDER — AMPHETAMINE-DEXTROAMPHETAMINE 10 MG PO TABS: 10.0000 mg | ORAL_TABLET | Freq: Three times a day (TID) | ORAL | 0 refills | Status: DC

## 2017-03-17 MED ORDER — AMPHETAMINE-DEXTROAMPHET ER 30 MG PO CP24: 30.0000 mg | ORAL_CAPSULE | ORAL | 0 refills | Status: DC

## 2017-03-17 NOTE — Progress Notes (Signed)
Anita Rice  MRN: [id number] DOB: 07-Jan-1956  PCP: [name], PA-C  Subjective:  Pt is a 61 year old female presents to clinic for medication management. She was here 5/12 for the same.   She has brought with her the next 3 months of her Adderall Rx as the prescription was written incorrectly. She would like it to say 60m take 2 in the morning and 1 in the afternoon as needed. As it is written now, it states take 3 daily.    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 She feels very unorganized and cannot stay on task. She normally takes this daily  Lorazepam 0.555m- History of anxiety. Takes every morning on the weekdays.  Denies SI or HI.   Review of Systems  Neurological: Negative for dizziness, light-headedness and headaches.  Psychiatric/Behavioral: Positive for decreased concentration. Negative for behavioral problems and dysphoric mood. The patient is hyperactive. The patient is not nervous/anxious.     Patient Active Problem List   Diagnosis Date Noted  . Macrocytosis without anemia 06/01/2014  . Thrombocytosis (HCTalking [place]08/17/2015  . Alcoholic peripheral neuropathy (HCFoard08/17/2015    Current Outpatient Prescriptions on File Prior to Visit  Medication Sig Dispense Refill  . albuterol (PROVENTIL HFA;VENTOLIN HFA) 108 (90 Base) MCG/ACT inhaler Inhale 2 puffs into the lungs every 6 (six) hours as needed for wheezing or shortness of breath. 1 Inhaler 3  . amphetamine-dextroamphetamine (ADDERALL) 10 MG tablet Take 2 tablets (20 mg total) by mouth daily with breakfast. (Patient taking differently: Take 20 mg by mouth daily with breakfast. ) 90 tablet 0  . [START ON 04/26/2017] amphetamine-dextroamphetamine (ADDERALL) 10 MG tablet Take 2 tablets (20 mg total) by mouth daily with breakfast. 90 tablet 0  . budesonide-formoterol (SYMBICORT) 160-4.5 MCG/ACT inhaler Inhale 2 puffs into the lungs every morning. 1 Inhaler 6  . desoximetasone  (TOPICORT) 0.25 % cream Apply 1 application topically once as needed (psoriasis).     . Dextromethorphan-Guaifenesin (MUCINEX DM MAXIMUM STRENGTH) 60-1200 MG TB12 Take 1 tablet by mouth every 12 (twelve) hours. 14 each 1  . Estradiol (VAGIFEM) 10 MCG TABS vaginal tablet Place 1 tablet (10 mcg total) vaginally 2 (two) times a week. 8 tablet 2  . Ferrous Sulfate (IRON) 28 MG TABS Take 1 tablet by mouth every morning.     . fexofenadine (ALLEGRA) 180 MG tablet TAKE 1 TABLET EVERY DAY 30 tablet 2  . fluticasone (FLONASE) 50 MCG/ACT nasal spray USE 2 SPRAYS IN THE NOSE ONCE DAILY AS DIRECTED 48 g 4  . ibuprofen (ADVIL,MOTRIN) 200 MG tablet Take 400 mg by mouth every 6 (six) hours as needed for headache or mild pain.     . Marland KitchenORazepam (ATIVAN) 0.5 MG tablet Take 1 tablet (0.5 mg total) by mouth daily. Take as needed 30 tablet 0  . [START ON 03/27/2017] LORazepam (ATIVAN) 0.5 MG tablet Take 1 tablet (0.5 mg total) by mouth 2 (two) times daily as needed for anxiety. 30 tablet 1  . [START ON 04/26/2017] LORazepam (ATIVAN) 0.5 MG tablet Take 1 tablet (0.5 mg total) by mouth 2 (two) times daily as needed for anxiety. 30 tablet 1  . Multiple Vitamins-Minerals (CENTRUM SILVER PO) Take 1 tablet by mouth every morning.     . predniSONE (DELTASONE) 50 MG tablet Once daily x 7 days 7 tablet 0   No current facility-administered medications on file prior to visit.     Allergies  Allergen  Reactions  . Benadryl [Diphenhydramine] Itching  . Neosporin [Neomycin-Bacitracin Zn-Polymyx] Rash     Objective:  BP 105/70   Pulse 90   Temp 98.6 F (37 C) (Oral)   Resp 16   Ht 5' (1.524 m)   Wt 116 lb 9.6 oz (52.9 kg)   SpO2 96%   BMI 22.77 kg/m   Physical Exam  Constitutional: She is oriented to person, place, and time and well-developed, well-nourished, and in no distress. No distress.  Cardiovascular: Normal rate, regular rhythm and normal heart sounds.   Neurological: She is alert and oriented to person, place,  and time. GCS score is 15.  Skin: Skin is warm and dry.  Psychiatric: Mood, memory, affect and judgment normal.  Vitals reviewed.   Assessment and Plan :  1. Attention deficit disorder, unspecified hyperactivity presence 2. Encounter for medication management - amphetamine-dextroamphetamine (ADDERALL) 10 MG tablet; Take 1 tablet (10 mg total) by mouth 3 (three) times daily.  Dispense: 90 tablet; Refill: 0 - Pt has brought her original 3 prescriptions here with her today. Will rewrite her medications. OK to fill 3 months.    Mercer Pod, PA-C  Primary Care at [place] 03/17/2017 12:42 PM

## 2017-03-17 NOTE — Telephone Encounter (Signed)
Has appt today

## 2017-03-17 NOTE — Patient Instructions (Addendum)
  Please sign up for Mychart.   Thank you for coming in today. I hope you feel we met your needs.  Feel free to call [facility] if you have any questions or further requests.  Please consider signing up for MyChart if you do not already have it, as this is a great way to communicate with me.  Best,  Whitney McVey, PA-C   IF you received an x-ray today, you will receive an invoice from [facility]. Please contact [facility] at [phone] with questions or concerns regarding your invoice.   IF you received labwork today, you will receive an invoice from [facility]. Please contact [facility] at [phone] with questions or concerns regarding your invoice.   Our billing staff will not be able to assist you with questions regarding bills from these companies.  You will be contacted with the lab results as soon as they are available. The fastest way to get your results is to activate your My Chart account. Instructions are located on the last page of this paperwork. If you have not heard from [place] regarding the results in 2 weeks, please contact this office.

## 2017-03-21 ENCOUNTER — Encounter: Payer: Self-pay | Admitting: Physician Assistant

## 2017-03-22 MED ORDER — AMPHETAMINE-DEXTROAMPHETAMINE 10 MG PO TABS: 10.0000 mg | ORAL_TABLET | Freq: Three times a day (TID) | ORAL | 0 refills | Status: DC

## 2017-03-22 MED ORDER — AMPHETAMINE-DEXTROAMPHET ER 30 MG PO CP24
30.0000 mg | ORAL_CAPSULE | ORAL | 0 refills | Status: DC
Start: 2017-04-16 — End: 2017-03-22

## 2017-03-22 MED ORDER — AMPHETAMINE-DEXTROAMPHET ER 30 MG PO CP24: 30.0000 mg | ORAL_CAPSULE | ORAL | 0 refills | Status: DC

## 2017-03-23 ENCOUNTER — Encounter: Payer: Self-pay | Admitting: Physician Assistant

## 2017-04-10 ENCOUNTER — Other Ambulatory Visit: Payer: Self-pay | Admitting: Physician Assistant

## 2017-04-10 ENCOUNTER — Telehealth: Payer: Self-pay | Admitting: *Deleted

## 2017-04-10 DIAGNOSIS — N898 Other specified noninflammatory disorders of vagina: Secondary | ICD-10-CM

## 2017-04-10 MED ORDER — ESTROGENS, CONJUGATED 0.625 MG/GM VA CREA: 1.0000 | TOPICAL_CREAM | Freq: Every day | VAGINAL | 12 refills | Status: DC

## 2017-04-10 MED ORDER — FLUOCINOLONE ACETONIDE 0.01 % EX CREA: TOPICAL_CREAM | Freq: Two times a day (BID) | CUTANEOUS | 0 refills | Status: DC

## 2017-04-10 NOTE — Telephone Encounter (Signed)
Spoke with [place], at [place] [phone]. Rx for Premarin and Fluocinolone Acetonide cancelled, per [facility]. PA

## 2017-04-17 ENCOUNTER — Other Ambulatory Visit: Payer: Self-pay | Admitting: Physician Assistant

## 2017-04-28 ENCOUNTER — Ambulatory Visit (INDEPENDENT_AMBULATORY_CARE_PROVIDER_SITE_OTHER): Payer: BLUE CROSS/BLUE SHIELD | Admitting: Physician Assistant

## 2017-04-28 ENCOUNTER — Encounter: Payer: Self-pay | Admitting: Physician Assistant

## 2017-04-28 VITALS — BP 107/71 | HR 84 | Temp 98.6°F | Resp 16 | Ht 60.0 in | Wt 115.4 lb

## 2017-04-28 DIAGNOSIS — R0981 Nasal congestion: Secondary | ICD-10-CM

## 2017-04-28 DIAGNOSIS — K51919 Ulcerative colitis, unspecified with unspecified complications: Secondary | ICD-10-CM

## 2017-04-28 DIAGNOSIS — R41840 Attention and concentration deficit: Secondary | ICD-10-CM

## 2017-04-28 DIAGNOSIS — R109 Unspecified abdominal pain: Secondary | ICD-10-CM

## 2017-04-28 MED ORDER — PREDNISONE 20 MG PO TABS: ORAL_TABLET | ORAL | 0 refills | Status: DC

## 2017-04-28 MED ORDER — ATOMOXETINE HCL 40 MG PO CAPS: 40.0000 mg | ORAL_CAPSULE | Freq: Every day | ORAL | 3 refills | Status: DC

## 2017-04-28 MED ORDER — FLUTICASONE PROPIONATE 50 MCG/ACT NA SUSP: NASAL | 4 refills | Status: DC

## 2017-04-28 NOTE — Progress Notes (Signed)
Anita Rice  MRN: [id number] DOB: May 25, 1956  PCP: [name], PA-C  Subjective:  Pt is a 61 year old female who presents to clinic for abdominal pain.   Taking Adderall XR and believes her abdominal pain is due to the medications. Stopped med 2 weeks ago. Symptoms have improved, however she believes have activated her colitis. Last colitis flare >2 years ago. C/o bloody diarrhea.   she is still having symptoms.   She was here 5/12 with the c/c of abdominal pain and diarrhea. She stopped XR for a few months but started it again "as a test to see if it would happen again".  She reports problems with insurance and cannot get the adderall she has always been on for $7. It costs $40. She would like to try something else.  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   Nasal congestion - Needs refill of Flonase.   Review of Systems  Constitutional: Negative for chills, fatigue and fever.  Gastrointestinal: Positive for abdominal pain, blood in stool and diarrhea.  Psychiatric/Behavioral: Positive for decreased concentration.    Patient Active Problem List   Diagnosis Date Noted  . Macrocytosis without anemia 06/01/2014  . Thrombocytosis ([place]) 06/01/2014  . Alcoholic peripheral neuropathy ([place]) 06/01/2014    Current Outpatient Prescriptions on File Prior to Visit  Medication Sig Dispense Refill  . ADDERALL XR 30 MG 24 hr capsule Take 30 mg by mouth every morning.  0  . albuterol (PROVENTIL HFA;VENTOLIN HFA) 108 (90 Base) MCG/ACT inhaler Inhale 2 puffs into the lungs every 6 (six) hours as needed for wheezing or shortness of breath. 1 Inhaler 3  . [START ON 05/22/2017] amphetamine-dextroamphetamine (ADDERALL) 10 MG tablet Take 1 tablet (10 mg total) by mouth 3 (three) times daily. 90 tablet 0  . budesonide-formoterol (SYMBICORT) 160-4.5 MCG/ACT inhaler Inhale 2 puffs into the lungs every morning. 1 Inhaler 6  . conjugated estrogens (PREMARIN) vaginal cream Place 1 Applicatorful  vaginally daily. 42.5 g 12  . Estradiol (VAGIFEM) 10 MCG TABS vaginal tablet Place 1 tablet (10 mcg total) vaginally 2 (two) times a week. 8 tablet 2  . Ferrous Sulfate (IRON) 28 MG TABS Take 1 tablet by mouth every morning.     . fexofenadine (ALLEGRA) 180 MG tablet TAKE 1 TABLET EVERY DAY 30 tablet 0  . fluocinolone (VANOS) 0.01 % cream Apply topically 2 (two) times daily. 30 g 0  . fluticasone (FLONASE) 50 MCG/ACT nasal spray USE 2 SPRAYS IN THE NOSE ONCE DAILY AS DIRECTED 48 g 4  . ibuprofen (ADVIL,MOTRIN) 200 MG tablet Take 400 mg by mouth every 6 (six) hours as needed for headache or mild pain.     Marland Kitchen LORazepam (ATIVAN) 0.5 MG tablet Take 1 tablet (0.5 mg total) by mouth 2 (two) times daily as needed for anxiety. 30 tablet 1  . Multiple Vitamins-Minerals (CENTRUM SILVER PO) Take 1 tablet by mouth every morning.      No current facility-administered medications on file prior to visit.     Allergies  Allergen Reactions  . Benadryl [Diphenhydramine] Itching  . Neosporin [Neomycin-Bacitracin Zn-Polymyx] Rash     Objective:  BP 107/71   Pulse 84   Temp 98.6 F (37 C) (Oral)   Resp 16   Ht 5' (1.524 m)   Wt 115 lb 6.4 oz (52.3 kg)   SpO2 97%   BMI 22.54 kg/m   Physical Exam  Constitutional: She is oriented to person, place, and time and  well-developed, well-nourished, and in no distress. No distress.  Cardiovascular: Normal rate, regular rhythm and normal heart sounds.   Neurological: She is alert and oriented to person, place, and time. GCS score is 15.  Skin: Skin is warm and dry.  Psychiatric: Mood, memory, affect and judgment normal.  Vitals reviewed.   Assessment and Plan :  1. Difficulty concentrating - atomoxetine (STRATTERA) 40 MG capsule; Take 1 capsule (40 mg total) by mouth daily. May increase dose after 3 days.  Dispense: 30 capsule; Refill: 3 - Adderall costs too much at her pharmacy. Will start Strattera. RTC in 1 month for recheck.  2. Abdominal pain,  unspecified abdominal location 3. Ulcerative colitis with complication, unspecified location (HCC) - predniSONE (DELTASONE) 20 MG tablet; 3 pills x 5 days; 2 pills x 5 days; 1 pill x 5 days  Dispense: 30 tablet; Refill: 0 - Diarrhea secondary to medication side effect has triggered her colitis. Plan to treat with prednisone taper.  4. Nasal congestion - fluticasone (FLONASE) 50 MCG/ACT nasal spray; USE 2 SPRAYS IN THE NOSE ONCE DAILY AS DIRECTED  Dispense: 48 g; Refill: 4   Whitney Trev Boley, PA-C  Primary Care at [place] 04/28/2017 12:15 PM

## 2017-05-28 ENCOUNTER — Other Ambulatory Visit: Payer: Self-pay | Admitting: Physician Assistant

## 2017-06-11 ENCOUNTER — Other Ambulatory Visit: Payer: Self-pay | Admitting: Emergency Medicine

## 2017-06-11 DIAGNOSIS — F988 Other specified behavioral and emotional disorders with onset usually occurring in childhood and adolescence: Secondary | ICD-10-CM

## 2017-06-11 MED ORDER — AMPHETAMINE-DEXTROAMPHETAMINE 10 MG PO TABS: 10.0000 mg | ORAL_TABLET | Freq: Three times a day (TID) | ORAL | 0 refills | Status: DC

## 2017-06-11 NOTE — Progress Notes (Unsigned)
Pharmacist called in regarding Adderall 10 mg tablets not received. Pharmacist states, substitute pharmacy worked last week and may have thrown script out.  New script requested from 05/25/17. Script printed. Will wait for providers signature.

## 2017-06-12 NOTE — Progress Notes (Signed)
Done. TY.

## 2017-07-20 ENCOUNTER — Encounter: Payer: Self-pay | Admitting: Physician Assistant

## 2017-07-20 ENCOUNTER — Ambulatory Visit (INDEPENDENT_AMBULATORY_CARE_PROVIDER_SITE_OTHER): Payer: BLUE CROSS/BLUE SHIELD | Admitting: Physician Assistant

## 2017-07-20 VITALS — BP 100/60 | HR 78 | Temp 98.1°F | Resp 16 | Ht 60.0 in | Wt 115.8 lb

## 2017-07-20 DIAGNOSIS — Z7189 Other specified counseling: Secondary | ICD-10-CM

## 2017-07-20 DIAGNOSIS — Z23 Encounter for immunization: Secondary | ICD-10-CM

## 2017-07-20 DIAGNOSIS — F909 Attention-deficit hyperactivity disorder, unspecified type: Secondary | ICD-10-CM | POA: Diagnosis not present

## 2017-07-20 DIAGNOSIS — F411 Generalized anxiety disorder: Secondary | ICD-10-CM | POA: Diagnosis not present

## 2017-07-20 MED ORDER — LISDEXAMFETAMINE DIMESYLATE 30 MG PO CAPS: 30.0000 mg | ORAL_CAPSULE | ORAL | 0 refills | Status: DC

## 2017-07-20 MED ORDER — LISDEXAMFETAMINE DIMESYLATE 10 MG PO CAPS: 10.0000 mg | ORAL_CAPSULE | Freq: Every day | ORAL | 0 refills | Status: DC

## 2017-07-20 MED ORDER — LISDEXAMFETAMINE DIMESYLATE 30 MG PO CAPS: 30.0000 mg | ORAL_CAPSULE | Freq: Every day | ORAL | 0 refills | Status: DC

## 2017-07-20 MED ORDER — LORAZEPAM 0.5 MG PO TABS: 0.5000 mg | ORAL_TABLET | Freq: Two times a day (BID) | ORAL | 0 refills | Status: AC | PRN

## 2017-07-20 MED ORDER — LORAZEPAM 0.5 MG PO TABS: 0.5000 mg | ORAL_TABLET | Freq: Two times a day (BID) | ORAL | 0 refills | Status: DC | PRN

## 2017-07-20 NOTE — Progress Notes (Signed)
Anita Rice  MRN: [id number] DOB: 1956-09-01  PCP: [name], PA-C  Subjective:  Pt is a 61 year old female who presents to clinic for medication consultation.  Anita Rice is not working for her. She would like to try a different medication for her ADHD. She has taken adderall in the past, this worked well for her. She reports problems with insurance and cannot get the adderall she has always been on for $7. It costs $40.  Needs refill of Lorazepam.   Review of Systems  Respiratory: Negative for chest tightness and shortness of breath.   Cardiovascular: Negative for chest pain and palpitations.  Neurological: Negative for dizziness and headaches.  Psychiatric/Behavioral: Positive for decreased concentration. Negative for sleep disturbance. The patient is hyperactive.     Patient Active Problem List   Diagnosis Date Noted  . Macrocytosis without anemia 06/01/2014  . Thrombocytosis ([place]) 06/01/2014  . Alcoholic peripheral neuropathy ([place]) 06/01/2014    Current Outpatient Prescriptions on File Prior to Visit  Medication Sig Dispense Refill  . albuterol (PROVENTIL HFA;VENTOLIN HFA) 108 (90 Base) MCG/ACT inhaler Inhale 2 puffs into the lungs every 6 (six) hours as needed for wheezing or shortness of breath. 1 Inhaler 3  . budesonide-formoterol (SYMBICORT) 160-4.5 MCG/ACT inhaler Inhale 2 puffs into the lungs every morning. 1 Inhaler 6  . conjugated estrogens (PREMARIN) vaginal cream Place 1 Applicatorful vaginally daily. 42.5 g 12  . Estradiol (VAGIFEM) 10 MCG TABS vaginal tablet Place 1 tablet (10 mcg total) vaginally 2 (two) times a week. 8 tablet 2  . Ferrous Sulfate (IRON) 28 MG TABS Take 1 tablet by mouth every morning.     . fexofenadine (ALLEGRA) 180 MG tablet TAKE 1 TABLET BY MOUTH EVERY DAY 30 tablet 0  . fluocinolone (VANOS) 0.01 % cream Apply topically 2 (two) times daily. 30 g 0  . fluticasone (FLONASE) 50 MCG/ACT nasal spray USE 2 SPRAYS IN THE NOSE ONCE  DAILY AS DIRECTED 48 g 4  . ibuprofen (ADVIL,MOTRIN) 200 MG tablet Take 400 mg by mouth every 6 (six) hours as needed for headache or mild pain.     . Multiple Vitamins-Minerals (CENTRUM SILVER PO) Take 1 tablet by mouth every morning.     Marland Kitchen amphetamine-dextroamphetamine (ADDERALL) 10 MG tablet Take 1 tablet (10 mg total) by mouth 3 (three) times daily. 90 tablet 0  . atomoxetine (STRATTERA) 40 MG capsule Take 1 capsule (40 mg total) by mouth daily. May increase dose after 3 days. (Patient not taking: Reported on 07/20/2017) 30 capsule 3   No current facility-administered medications on file prior to visit.     Allergies  Allergen Reactions  . Adderall Xr [Amphetamine-Dextroamphet Er] Diarrhea  . Benadryl [Diphenhydramine] Itching  . Neosporin [Neomycin-Bacitracin Zn-Polymyx] Rash     Objective:  BP 100/60   Pulse 78   Temp 98.1 F (36.7 C) (Oral)   Resp 16   Ht 5' (1.524 m)   Wt 115 lb 12.8 oz (52.5 kg)   SpO2 98%   BMI 22.62 kg/m   Physical Exam  Constitutional: She is oriented to person, place, and time and well-developed, well-nourished, and in no distress. No distress.  Neurological: She is alert and oriented to person, place, and time. GCS score is 15.  Skin: Skin is warm and dry.  Psychiatric: Mood, memory, affect and judgment normal.  Vitals reviewed.   Assessment and Plan :  1. Attention deficit hyperactivity disorder (ADHD), unspecified ADHD type - lisdexamfetamine (VYVANSE) 30 MG  capsule; Take 1 capsule (30 mg total) by mouth daily. May increase in increments of 10 mg at weekly intervals.  Dispense: 30 capsule; Refill: 0 - lisdexamfetamine (VYVANSE) 10 MG capsule; Take 1 capsule (10 mg total) by mouth daily. May take with 23m daily for total of 421m Dispense: 30 capsule; Refill: 0 - Will try Vyvanse. Start 30 mg once daily in the morning; may increase in increments of 10 mg at weekly intervals until optimal response is obtained. RTC in 4-6 weeks for recheck.   2.  Generalized anxiety disorder 3. Encounter for medication counseling - LORazepam (ATIVAN) 0.5 MG tablet; Take 1 tablet (0.5 mg total) by mouth 2 (two) times daily as needed for anxiety.  Dispense: 60 tablet; Refill: 0  4. Flu vaccine need - Flu Vaccine QUAD 36+ mos IM  WhMercer PodPA-C  [facility] [place]/02/2017 10:52 AM

## 2017-07-20 NOTE — Patient Instructions (Addendum)
IF you received an x-ray today, you will receive an invoice from [facility]. Please contact [facility] at [phone] with questions or concerns regarding your invoice.   IF you received labwork today, you will receive an invoice from [place]. Please contact [facility] at [phone] with questions or concerns regarding your invoice.   Our billing staff will not be able to assist you with questions regarding bills from these companies.  You will be contacted with the lab results as soon as they are available. The fastest way to get your results is to activate your My Chart account. Instructions are located on the last page of this paperwork. If you have not heard from [place] regarding the results in 2 weeks, please contact this office.    Influenza (Flu) Vaccine (Inactivated or Recombinant): What You Need to Know 1. Why get vaccinated? Influenza ("flu") is a contagious disease that spreads around the [place] every year, usually between October and May. Flu is caused by influenza viruses, and is spread mainly by coughing, sneezing, and close contact. Anyone can get flu. Flu strikes suddenly and can last several days. Symptoms vary by age, but can include:  fever/chills  sore throat  muscle aches  fatigue  cough  headache  runny or stuffy nose  Flu can also lead to pneumonia and blood infections, and cause diarrhea and seizures in children. If you have a medical condition, such as heart or lung disease, flu can make it worse. Flu is more dangerous for some people. Infants and young children, people 46 years of age and older, pregnant women, and people with certain health conditions or a weakened immune system are at greatest risk. Each year thousands of people in the [place] die from flu, and many more are hospitalized. Flu vaccine can:  keep you from getting flu,  make flu less severe if you do get it, and  keep you from spreading flu to your  family and other people. 2. Inactivated and recombinant flu vaccines A dose of flu vaccine is recommended every flu season. Children 6 months through 39 years of age may need two doses during the same flu season. Everyone else needs only one dose each flu season. Some inactivated flu vaccines contain a very small amount of a mercury-based preservative called thimerosal. Studies have not shown thimerosal in vaccines to be harmful, but flu vaccines that do not contain thimerosal are available. There is no live flu virus in flu shots. They cannot cause the flu. There are many flu viruses, and they are always changing. Each year a new flu vaccine is made to protect against three or four viruses that are likely to cause disease in the upcoming flu season. But even when the vaccine doesn't exactly match these viruses, it may still provide some protection. Flu vaccine cannot prevent:  flu that is caused by a virus not covered by the vaccine, or  illnesses that look like flu but are not.  It takes about 2 weeks for protection to develop after vaccination, and protection lasts through the flu season. 3. Some people should not get this vaccine Tell the person who is giving you the vaccine:  If you have any severe, life-threatening allergies. If you ever had a life-threatening allergic reaction after a dose of flu vaccine, or have a severe allergy to any part of this vaccine, you may be advised not to get vaccinated. Most, but not all, types of flu vaccine contain a small amount  of egg protein.  If you ever had Guillain-Barr Syndrome (also called GBS). Some people with a history of GBS should not get this vaccine. This should be discussed with your doctor.  If you are not feeling well. It is usually okay to get flu vaccine when you have a mild illness, but you might be asked to come back when you feel better.  4. Risks of a vaccine reaction With any medicine, including vaccines, there is a chance of  reactions. These are usually mild and go away on their own, but serious reactions are also possible. Most people who get a flu shot do not have any problems with it. Minor problems following a flu shot include:  soreness, redness, or swelling where the shot was given  hoarseness  sore, red or itchy eyes  cough  fever  aches  headache  itching  fatigue  If these problems occur, they usually begin soon after the shot and last 1 or 2 days. More serious problems following a flu shot can include the following:  There may be a small increased risk of Guillain-Barre Syndrome (GBS) after inactivated flu vaccine. This risk has been estimated at 1 or 2 additional cases per million people vaccinated. This is much lower than the risk of severe complications from flu, which can be prevented by flu vaccine.  Young children who get the flu shot along with pneumococcal vaccine (PCV13) and/or DTaP vaccine at the same time might be slightly more likely to have a seizure caused by fever. Ask your doctor for more information. Tell your doctor if a child who is getting flu vaccine has ever had a seizure.  Problems that could happen after any injected vaccine:  People sometimes faint after a medical procedure, including vaccination. Sitting or lying down for about 15 minutes can help prevent fainting, and injuries caused by a fall. Tell your doctor if you feel dizzy, or have vision changes or ringing in the ears.  Some people get severe pain in the shoulder and have difficulty moving the arm where a shot was given. This happens very rarely.  Any medication can cause a severe allergic reaction. Such reactions from a vaccine are very rare, estimated at about 1 in a million doses, and would happen within a few minutes to a few hours after the vaccination. As with any medicine, there is a very remote chance of a vaccine causing a serious injury or death. The safety of vaccines is always being monitored.  For more information, visit: http://www.aguilar.org/ 5. What if there is a serious reaction? What should I look for? Look for anything that concerns you, such as signs of a severe allergic reaction, very high fever, or unusual behavior. Signs of a severe allergic reaction can include hives, swelling of the face and throat, difficulty breathing, a fast heartbeat, dizziness, and weakness. These would start a few minutes to a few hours after the vaccination. What should I do?  If you think it is a severe allergic reaction or other emergency that can't wait, call 9-1-1 and get the person to the nearest hospital. Otherwise, call your doctor.  Reactions should be reported to the Vaccine Adverse Event Reporting System (VAERS). Your doctor should file this report, or you can do it yourself through the VAERS web site at www.vaers.SamedayNews.es, or by calling 314-278-3418. ? VAERS does not give medical advice. 6. The National Vaccine Injury Compensation Program The [employer] Vaccine Injury Compensation Program (VICP) is a Technical brewer that was created  to compensate people who may have been injured by certain vaccines. Persons who believe they may have been injured by a vaccine can learn about the program and about filing a claim by calling 812-114-0620 or visiting the [place] website at GoldCloset.com.ee. There is a time limit to file a claim for compensation. 7. How can I learn more?  Ask your healthcare provider. He or she can give you the vaccine package insert or suggest other sources of information.  Call your local or state health department.  Contact the Centers for Disease Control and Prevention (CDC): ? Call 605-347-7430 (1-800-CDC-INFO) or ? Visit CDC's website at https://gibson.com/ Vaccine Information Statement, Inactivated Influenza Vaccine (05/22/2014) This information is not intended to replace advice given to you by your health care provider. Make sure you discuss any  questions you have with your health care provider. Document Released: 07/27/2006 Document Revised: 06/22/2016 Document Reviewed: 06/22/2016 Elsevier Interactive Patient Education  2017 [employer].

## 2017-07-27 ENCOUNTER — Other Ambulatory Visit: Payer: Self-pay

## 2017-07-27 ENCOUNTER — Telehealth: Payer: Self-pay | Admitting: Physician Assistant

## 2017-07-27 MED ORDER — FEXOFENADINE HCL 180 MG PO TABS: 180.0000 mg | ORAL_TABLET | Freq: Every day | ORAL | 0 refills | Status: DC

## 2017-07-27 NOTE — Telephone Encounter (Signed)
Pt is checking on status of her request for her allergy medication she states that the pharmacy has requested this three times now   Best number [phone]

## 2017-07-27 NOTE — Telephone Encounter (Signed)
Refill for allegra sent to pharmacy.

## 2017-08-13 ENCOUNTER — Other Ambulatory Visit: Payer: Self-pay | Admitting: Physician Assistant

## 2017-08-13 DIAGNOSIS — N952 Postmenopausal atrophic vaginitis: Secondary | ICD-10-CM

## 2017-08-18 ENCOUNTER — Other Ambulatory Visit: Payer: Self-pay | Admitting: Physician Assistant

## 2017-08-18 DIAGNOSIS — F411 Generalized anxiety disorder: Secondary | ICD-10-CM

## 2017-08-23 ENCOUNTER — Other Ambulatory Visit: Payer: Self-pay | Admitting: Physician Assistant

## 2017-08-29 DIAGNOSIS — M654 Radial styloid tenosynovitis [de Quervain]: Secondary | ICD-10-CM | POA: Diagnosis not present

## 2017-09-20 DIAGNOSIS — M654 Radial styloid tenosynovitis [de Quervain]: Secondary | ICD-10-CM | POA: Diagnosis not present

## 2017-09-25 ENCOUNTER — Other Ambulatory Visit: Payer: Self-pay | Admitting: Physician Assistant

## 2017-10-02 ENCOUNTER — Other Ambulatory Visit: Payer: Self-pay | Admitting: Physician Assistant

## 2017-10-02 DIAGNOSIS — N952 Postmenopausal atrophic vaginitis: Secondary | ICD-10-CM

## 2017-10-03 ENCOUNTER — Ambulatory Visit (INDEPENDENT_AMBULATORY_CARE_PROVIDER_SITE_OTHER): Payer: BLUE CROSS/BLUE SHIELD | Admitting: Physician Assistant

## 2017-10-03 ENCOUNTER — Other Ambulatory Visit: Payer: Self-pay

## 2017-10-03 ENCOUNTER — Encounter: Payer: Self-pay | Admitting: Physician Assistant

## 2017-10-03 VITALS — BP 109/69 | HR 110 | Temp 102.8°F | Resp 16 | Ht 59.75 in | Wt 116.6 lb

## 2017-10-03 DIAGNOSIS — R197 Diarrhea, unspecified: Secondary | ICD-10-CM | POA: Diagnosis not present

## 2017-10-03 DIAGNOSIS — D72829 Elevated white blood cell count, unspecified: Secondary | ICD-10-CM | POA: Diagnosis not present

## 2017-10-03 DIAGNOSIS — R509 Fever, unspecified: Secondary | ICD-10-CM

## 2017-10-03 LAB — POCT CBC
Granulocyte percent: 74.3 % (ref 37–80)
HCT, POC: 36.2 % — AB (ref 37.7–47.9)
Hemoglobin: 12 g/dL — AB (ref 12.2–16.2)
Lymph, poc: 2.8 (ref 0.6–3.4)
MCH, POC: 31.9 pg — AB (ref 27–31.2)
MCHC: 33.3 g/dL (ref 31.8–35.4)
MCV: 95.9 fL (ref 80–97)
MID (cbc): 0.7 (ref 0–0.9)
MPV: 6.8 fL (ref 0–99.8)
POC Granulocyte: 10.1 — AB (ref 2–6.9)
POC LYMPH PERCENT: 20.7 % (ref 10–50)
POC MID %: 5 % (ref 0–12)
Platelet Count, POC: 583 10*3/uL — AB (ref 142–424)
RBC: 3.77 M/uL — AB (ref 4.04–5.48)
RDW, POC: 13.4 %
WBC: 13.6 10*3/uL — AB (ref 4.6–10.2)

## 2017-10-03 MED ORDER — SODIUM CHLORIDE 0.9 % IV BOLUS (SEPSIS)
1000.0000 mL | Freq: Once | INTRAVENOUS | Status: AC
  Administered 2017-10-03: 1000 mL via INTRAVENOUS

## 2017-10-03 NOTE — Progress Notes (Signed)
Anita Rice  MRN: [id number] DOB: 10-05-1956  PCP: Anita Hiss, PA-C  Subjective:  Pt is a 61 year old female who presents to clinic for diarrhea x 3 days. She is having about 6 episodes of diarrhea daily. Mostly happens at night. "It is a little better today". Last episode of diarrhea was about one hour ago. One episode today.  She had a half a banana, pop tart and a glass of orange juice this morning for breakfast. .Denies blood in her stool, abdominal pain/cramping, mucus in stool, fever, chills, back pain. No known sick contacts.    Review of Systems  Constitutional: Positive for fatigue. Negative for chills, diaphoresis and fever.  Gastrointestinal: Positive for diarrhea. Negative for abdominal pain, blood in stool, nausea, rectal pain and vomiting.  Musculoskeletal: Negative for back pain.  Neurological: Negative for dizziness, syncope and light-headedness.    Patient Active Problem List   Diagnosis Date Noted  . Macrocytosis without anemia 06/01/2014  . Thrombocytosis ([place]) 06/01/2014  . Alcoholic peripheral neuropathy ([place]) 06/01/2014    Current Outpatient Medications on File Prior to Visit  Medication Sig Dispense Refill  . albuterol (PROVENTIL HFA;VENTOLIN HFA) 108 (90 Base) MCG/ACT inhaler Inhale 2 puffs into the lungs every 6 (six) hours as needed for wheezing or shortness of breath. 1 Inhaler 3  . budesonide-formoterol (SYMBICORT) 160-4.5 MCG/ACT inhaler Inhale 2 puffs into the lungs every morning. 1 Inhaler 6  . conjugated estrogens (PREMARIN) vaginal cream Place 1 Applicatorful vaginally daily. 42.5 g 12  . Ferrous Sulfate (IRON) 28 MG TABS Take 1 tablet by mouth every morning.     . fexofenadine (ALLEGRA) 180 MG tablet TAKE 1 TABLET BY MOUTH EVERY DAY 30 tablet 0  . fluocinolone (VANOS) 0.01 % cream Apply topically 2 (two) times daily. 30 g 0  . fluticasone (FLONASE) 50 MCG/ACT nasal spray USE 2 SPRAYS IN THE NOSE ONCE DAILY AS DIRECTED 48 g 4  .  ibuprofen (ADVIL,MOTRIN) 200 MG tablet Take 400 mg by mouth every 6 (six) hours as needed for headache or mild pain.     Marland Kitchen lisdexamfetamine (VYVANSE) 10 MG capsule Take 1 capsule (10 mg total) by mouth daily. May take with 67m daily for total of 432m30 capsule 0  . lisdexamfetamine (VYVANSE) 30 MG capsule Take 1 capsule (30 mg total) by mouth daily. May increase in increments of 10 mg at weekly intervals. 30 capsule 0  . LORazepam (ATIVAN) 0.5 MG tablet Take 1 tablet (0.5 mg total) by mouth 2 (two) times daily as needed for anxiety. 60 tablet 0  . Multiple Vitamins-Minerals (CENTRUM SILVER PO) Take 1 tablet by mouth every morning.     . Derrill MemoN 10/04/2017] YUVAFEM 10 MCG TABS vaginal tablet PLACE 1 TABLET (10 MCG TOTAL) VAGINALLY 2 (TWO) TIMES A WEEK. 8 tablet 1  . amphetamine-dextroamphetamine (ADDERALL) 10 MG tablet Take 1 tablet (10 mg total) by mouth 3 (three) times daily. 90 tablet 0  . atomoxetine (STRATTERA) 40 MG capsule Take 1 capsule (40 mg total) by mouth daily. May increase dose after 3 days. (Patient not taking: Reported on 10/03/2017) 30 capsule 3  . lisdexamfetamine (VYVANSE) 10 MG capsule Take 1 capsule (10 mg total) by mouth daily. May take with 3090mor a total of 18m79m. 30 capsule 0  . lisdexamfetamine (VYVANSE) 10 MG capsule Take 1 capsule (10 mg total) by mouth daily. May take with 30mg20m a total of 18mg 54meeded. 30 capsule 0  .  lisdexamfetamine (VYVANSE) 30 MG capsule Take 1 capsule (30 mg total) by mouth every morning. May increase in increments of 10 mg at weekly intervals. 30 capsule 0  . lisdexamfetamine (VYVANSE) 30 MG capsule Take 1 capsule (30 mg total) by mouth daily. May increase in increments of 10 mg at weekly intervals. 30 capsule 0   No current facility-administered medications on file prior to visit.     Allergies  Allergen Reactions  . Adderall Xr [Amphetamine-Dextroamphet Er] Diarrhea  . Benadryl [Diphenhydramine] Itching  . Neosporin  [Neomycin-Bacitracin Zn-Polymyx] Rash     Objective:  BP 109/69   Pulse (!) 110   Temp (!) 102.8 F (39.3 C) (Oral)   Resp 16   Ht 4' 11.75" (1.518 m)   Wt 116 lb 9.6 oz (52.9 kg)   SpO2 97%   BMI 22.96 kg/m   Physical Exam  Constitutional: She is oriented to person, place, and time and well-developed, well-nourished, and in no distress. No distress.  Cardiovascular: Normal rate, regular rhythm and normal heart sounds.  Abdominal: Soft. Normal appearance and bowel sounds are normal. There is no tenderness. There is no rigidity, no rebound and no guarding.  Neurological: She is alert and oriented to person, place, and time. GCS score is 15.  Skin: Skin is warm and dry.  Psychiatric: Mood, memory, affect and judgment normal.  Vitals reviewed.   Results for orders placed or performed in visit on 10/03/17  POCT CBC  Result Value Ref Range   WBC 13.6 (A) 4.6 - 10.2 K/uL   Lymph, poc 2.8 0.6 - 3.4   POC LYMPH PERCENT 20.7 10 - 50 %L   MID (cbc) 0.7 0 - 0.9   POC MID % 5.0 0 - 12 %M   POC Granulocyte 10.1 (A) 2 - 6.9   Granulocyte percent 74.3 37 - 80 %G   RBC 3.77 (A) 4.04 - 5.48 M/uL   Hemoglobin 12.0 (A) 12.2 - 16.2 g/dL   HCT, POC 36.2 (A) 37.7 - 47.9 %   MCV 95.9 80 - 97 fL   MCH, POC 31.9 (A) 27 - 31.2 pg   MCHC 33.3 31.8 - 35.4 g/dL   RDW, POC 13.4 %   Platelet Count, POC 583 (A) 142 - 424 K/uL   MPV 6.8 0 - 99.8 fL    Assessment and Plan :  1. Diarrhea, unspecified type 2. Fever, unspecified 3. Leukocytosis, unspecified type - POCT CBC - Stool culture; Future - Ova and parasite examination - PR NORMAL SALINE SOLUTION INFUS - PR CATH IMPL VASC ACCESS PORTAL - Stool culture - sodium chloride 0.9 % bolus 1,000 mL - Pt c/o 3 days diarrhea. She is tachycardic, has a fever, increased WBC and platelet count. No concerning findings on PE.  IV fluids administered. Stool culture is pending - will await results to treat. RTC in 48 hours for recheck. Plan to repeat CBC.     Mercer Pod, PA-C  [facility] [place] 10/03/2017 2:46 PM

## 2017-10-03 NOTE — Patient Instructions (Addendum)
See below for bland diet. Try your best to push fluids - water, Gatorade, ensure.  Come back and see me on Friday.    Bland Diet A bland diet consists of foods that do not have a lot of fat or fiber. Foods without fat or fiber are easier for the body to digest. They are also less likely to irritate your mouth, throat, stomach, and other parts of your gastrointestinal tract. A bland diet is sometimes called a BRAT diet. What is my plan? Your health care provider or dietitian may recommend specific changes to your diet to prevent and treat your symptoms, such as:  Eating small meals often.  Cooking food until it is soft enough to chew easily.  Chewing your food well.  Drinking fluids slowly.  Not eating foods that are very spicy, sour, or fatty.  Not eating citrus fruits, such as oranges and grapefruit.  What do I need to know about this diet?  Eat a variety of foods from the bland diet food list.  Do not follow a bland diet longer than you have to.  Ask your health care provider whether you should take vitamins. What foods can I eat? Grains  Hot cereals, such as cream of wheat. Bread, crackers, or tortillas made from refined white flour. Rice. Vegetables Canned or cooked vegetables. Mashed or boiled potatoes. Fruits Bananas. Applesauce. Other types of cooked or canned fruit with the skin and seeds removed, such as canned peaches or pears. Meats and Other Protein Sources Scrambled eggs. Creamy peanut butter or other nut butters. Lean, well-cooked meats, such as chicken or fish. Tofu. Soups or broths. Dairy Low-fat dairy products, such as milk, cottage cheese, or yogurt. Beverages Water. Herbal tea. Apple juice. Sweets and Desserts Pudding. Custard. Fruit gelatin. Ice cream. Fats and Oils Mild salad dressings. Canola or olive oil. The items listed above may not be a complete list of allowed foods or beverages. Contact your dietitian for more options. What foods are not  recommended? Foods and ingredients that are often not recommended include:  Spicy foods, such as hot sauce or salsa.  Fried foods.  Sour foods, such as pickled or fermented foods.  Raw vegetables or fruits, especially citrus or berries.  Caffeinated drinks.  Alcohol.  Strongly flavored seasonings or condiments.  The items listed above may not be a complete list of foods and beverages that are not allowed. Contact your dietitian for more information. This information is not intended to replace advice given to you by your health care provider. Make sure you discuss any questions you have with your health care provider. Document Released: 01/24/2016 Document Revised: 03/09/2016 Document Reviewed: 10/14/2014 Elsevier Interactive Patient Education  2018 [employer].    IF you received an x-ray today, you will receive an invoice from [facility]. Please contact [facility] at [phone] with questions or concerns regarding your invoice.   IF you received labwork today, you will receive an invoice from [place]. Please contact [facility] at [phone] with questions or concerns regarding your invoice.   Our billing staff will not be able to assist you with questions regarding bills from these companies.  You will be contacted with the lab results as soon as they are available. The fastest way to get your results is to activate your My Chart account. Instructions are located on the last page of this paperwork. If you have not heard from [place] regarding the results in 2 weeks, please contact this office.

## 2017-10-05 ENCOUNTER — Other Ambulatory Visit: Payer: Self-pay

## 2017-10-05 ENCOUNTER — Ambulatory Visit (INDEPENDENT_AMBULATORY_CARE_PROVIDER_SITE_OTHER): Payer: BLUE CROSS/BLUE SHIELD | Admitting: Physician Assistant

## 2017-10-05 ENCOUNTER — Encounter: Payer: Self-pay | Admitting: Physician Assistant

## 2017-10-05 VITALS — BP 98/66 | HR 105 | Temp 100.6°F | Resp 16 | Ht 60.0 in | Wt 115.2 lb

## 2017-10-05 DIAGNOSIS — R197 Diarrhea, unspecified: Secondary | ICD-10-CM | POA: Diagnosis not present

## 2017-10-05 LAB — POCT URINALYSIS DIP (MANUAL ENTRY)
Glucose, UA: 100 mg/dL — AB
Leukocytes, UA: NEGATIVE
Nitrite, UA: NEGATIVE
Protein Ur, POC: 100 mg/dL — AB
Spec Grav, UA: 1.025 (ref 1.010–1.025)
Urobilinogen, UA: 0.2 U/dL
pH, UA: 6 (ref 5.0–8.0)

## 2017-10-05 LAB — POCT CBC
Granulocyte percent: 83.2 %G — AB (ref 37–80)
HCT, POC: 35.8 % — AB (ref 37.7–47.9)
Hemoglobin: 12.1 g/dL — AB (ref 12.2–16.2)
Lymph, poc: 1.9 (ref 0.6–3.4)
MCH, POC: 32.2 pg — AB (ref 27–31.2)
MCHC: 33.8 g/dL (ref 31.8–35.4)
MCV: 95.6 fL (ref 80–97)
MID (cbc): 0.7 (ref 0–0.9)
MPV: 6.6 fL (ref 0–99.8)
POC Granulocyte: 12.9 — AB (ref 2–6.9)
POC LYMPH PERCENT: 12.4 % (ref 10–50)
POC MID %: 4.4 %M (ref 0–12)
Platelet Count, POC: 556 10*3/uL — AB (ref 142–424)
RBC: 3.74 M/uL — AB (ref 4.04–5.48)
RDW, POC: 13.5 %
WBC: 15.5 10*3/uL — AB (ref 4.6–10.2)

## 2017-10-05 MED ORDER — SODIUM CHLORIDE 0.9 % IV BOLUS (SEPSIS)
1000.0000 mL | Freq: Once | INTRAVENOUS | Status: AC
  Administered 2017-10-05: 1000 mL via INTRAVENOUS

## 2017-10-05 NOTE — Patient Instructions (Addendum)
Come back and see me Monday. I want to make sure your blood work is improving.  Eat as tolerated: Broiled starches/cereals (potatoes, noodles, rice, wheat, and oat) with some salt are excellent foods to consider. In addition, crackers, bananas, yogurt, soups, and boiled vegetables.  Stay well hydrated with water, sports drinks and broths. Soft drinks and fruit juices that are high in sugar content should be avoided.  Continue taking Imodium as needed. In general, viral gastroenteritis is an acute and self-limited disease that does not require pharmacologic therapy. It is important to remember that adequate fluid repletion is the mainstay of treatment of acute viral gastroenteritis and that any pharmacologic agents are to be used as adjuncts  Your symptoms may last up to 2 weeks.    What is viral gastroenteritis?-Viral gastroenteritis is an infection that can cause diarrhea and vomiting. It happens when a person's stomach and intestines get infected with a virus. Both adults and children can get viral gastroenteritis. People can get the infection if they: ?Touch an infected person or a surface with the virus on it, and then don't wash their hands ?Eat foods or drink liquids with the virus in them. If people with the virus don't wash their hands, they can spread it to food or liquids they touch. What are the symptoms of viral gastroenteritis?-The infection causes diarrhea and vomiting. People can have either diarrhea or vomiting, or both. These symptoms usually start suddenly, and can be severe. Viral gastroenteritis can also cause: ?A fever ?A headache or muscle aches ?Belly pain or cramping ?A loss of appetite If you have diarrhea and vomiting, your body can lose too much water. Doctors call this "dehydration." Dehydration can make you have dark yellow urine and feel thirsty, tired, dizzy, or confused. Severe dehydration can be life-threatening. Babies, young children, and elderly people are  more likely to get severe dehydration. Do people with viral gastroenteritis need tests?-Not usually. Their doctor or nurse should be able to tell if they have it by learning about their symptoms and doing an exam. But the doctor or nurse might do tests to check for dehydration or to see which virus is causing the infection. These tests can include: ?Blood tests ?Urine tests ?Tests on a sample of bowel movement Is there anything I can do on my own to feel better or help my child?-Yes. People with viral gastroenteritis need to drink enough fluids so they don't get dehydrated. Some fluids help prevent dehydration better than others: ?Older children and adults can drink sports drinks. ?You can give babies and young children an "oral rehydration solution," such as Pedialyte. You can buy this in a store or pharmacy. If your child is vomiting, you can try to give your child a few teaspoons of fluid every few minutes. ?Babies who breastfeed can continue to breastfeed. People with viral gastroenteritis should avoid drinking juice or soda. These can make diarrhea worse. If you can keep food down, it's best to eat lean meats, fruits, vegetables, and whole-grain breads and cereals. Avoid eating foods with a lot of fat or sugar, which can make symptoms worse. If you are an adult younger than 64 and you have a new bout of diarrhea but no fever or blood in your bowel movements, you can take medicine to stop diarrhea such as loperamide (brand name: Imodium) for 1 to 2 days. If you are older than 32, have a fever, or have blood in your bowel movements, do not take these medicines without checking with  your doctor. Do not give medicines to stop diarrhea to children. Should I call the doctor or nurse?-Call the doctor or nurse if you or your child: ?Has any symptoms of dehydration ?Has diarrhea or vomiting that lasts longer than a few days ?Vomits up blood, has bloody diarrhea, or has severe belly pain ?Hasn't  had anything to drink in a few hours (for children), or in many hours (for adults) ?Hasn't needed to urinate in the past 6 to 8 hours (during the day), or if your baby or young child hasn't had a wet diaper for 4 to 6 hours How is viral gastroenteritis treated?-Most people do not need any treatment, because their symptoms will get better on their own. But people with severe dehydration might need treatment in the hospital for their dehydration. This involves getting fluids through an "IV" (a thin tube that goes into the vein). Doctors do not treat viral gastroenteritis with antibiotics. That's because antibiotics treat infections that are caused by bacteria - not viruses. Can viral gastroenteritis be prevented?-Sometimes. To lower the chance of getting or spreading the infection, you can: ?Wash your hands with soap and water after you use the bathroom or change your child's diaper, and before you eat. ?Avoid changing your child's diaper near where you prepare food. ?Make sure your baby gets the rotavirus vaccine. Vaccines can prevent certain serious or deadly infections. Rotavirus is a virus that commonly causes viral gastroenteritis in children.  IF you received an x-ray today, you will receive an invoice from [facility]. Please contact [facility] at [phone] with questions or concerns regarding your invoice.   IF you received labwork today, you will receive an invoice from [place]. Please contact [facility] at [phone] with questions or concerns regarding your invoice.   Our billing staff will not be able to assist you with questions regarding bills from these companies.  You will be contacted with the lab results as soon as they are available. The fastest way to get your results is to activate your My Chart account. Instructions are located on the last page of this paperwork. If you have not heard from [place] regarding the results in 2 weeks, please contact this office.

## 2017-10-05 NOTE — Progress Notes (Signed)
Anita Rice  MRN: [id number] DOB: Jul 27, 1956  PCP: [name], PA-C  Subjective:  Pt is a 61 year old female who presents to clinic for f/u diarrhea. She was here two days ago for the same c/c. This is her 5th day having symptoms.   Feels like her fever broke last night. Endorses chills. She is having 10-15 episodes of diarrhea daily, "small amounts. Like a little squirt". Stools are watery and brown. Imodium is helping reduce the amount of episodes.  Denies abdominal pain, nausea, vomiting, blood in stool, mucus in stool.  No recent antibiotic use or hospital visits.  She is able to keep fluids and solids down.   Review of Systems  Constitutional: Positive for chills. Negative for diaphoresis, fatigue and fever.  Respiratory: Negative for cough, chest tightness, shortness of breath and wheezing.   Cardiovascular: Negative for chest pain and palpitations.  Gastrointestinal: Positive for diarrhea. Negative for abdominal pain, nausea and vomiting.  Neurological: Positive for headaches. Negative for weakness and light-headedness.    Patient Active Problem List   Diagnosis Date Noted  . Macrocytosis without anemia 06/01/2014  . Thrombocytosis ([place]) 06/01/2014  . Alcoholic peripheral neuropathy ([place]) 06/01/2014    Current Outpatient Medications on File Prior to Visit  Medication Sig Dispense Refill  . albuterol (PROVENTIL HFA;VENTOLIN HFA) 108 (90 Base) MCG/ACT inhaler Inhale 2 puffs into the lungs every 6 (six) hours as needed for wheezing or shortness of breath. 1 Inhaler 3  . budesonide-formoterol (SYMBICORT) 160-4.5 MCG/ACT inhaler Inhale 2 puffs into the lungs every morning. 1 Inhaler 6  . conjugated estrogens (PREMARIN) vaginal cream Place 1 Applicatorful vaginally daily. 42.5 g 12  . Ferrous Sulfate (IRON) 28 MG TABS Take 1 tablet by mouth every morning.     . fexofenadine (ALLEGRA) 180 MG tablet TAKE 1 TABLET BY MOUTH EVERY DAY 30 tablet 0  . fluocinolone (VANOS)  0.01 % cream Apply topically 2 (two) times daily. 30 g 0  . fluticasone (FLONASE) 50 MCG/ACT nasal spray USE 2 SPRAYS IN THE NOSE ONCE DAILY AS DIRECTED 48 g 4  . ibuprofen (ADVIL,MOTRIN) 200 MG tablet Take 400 mg by mouth every 6 (six) hours as needed for headache or mild pain.     Marland Kitchen lisdexamfetamine (VYVANSE) 10 MG capsule Take 1 capsule (10 mg total) by mouth daily. May take with 63m daily for total of 423m30 capsule 0  . lisdexamfetamine (VYVANSE) 30 MG capsule Take 1 capsule (30 mg total) by mouth daily. May increase in increments of 10 mg at weekly intervals. 30 capsule 0  . LORazepam (ATIVAN) 0.5 MG tablet Take 1 tablet (0.5 mg total) by mouth 2 (two) times daily as needed for anxiety. 60 tablet 0  . Multiple Vitamins-Minerals (CENTRUM SILVER PO) Take 1 tablet by mouth every morning.     . Merril Abbe0 MCG TABS vaginal tablet PLACE 1 TABLET (10 MCG TOTAL) VAGINALLY 2 (TWO) TIMES A WEEK. 8 tablet 1  . amphetamine-dextroamphetamine (ADDERALL) 10 MG tablet Take 1 tablet (10 mg total) by mouth 3 (three) times daily. 90 tablet 0  . atomoxetine (STRATTERA) 40 MG capsule Take 1 capsule (40 mg total) by mouth daily. May increase dose after 3 days. (Patient not taking: Reported on 10/05/2017) 30 capsule 3  . lisdexamfetamine (VYVANSE) 10 MG capsule Take 1 capsule (10 mg total) by mouth daily. May take with 3049mor a total of 37m53m. 30 capsule 0  . lisdexamfetamine (VYVANSE) 10 MG capsule Take 1  capsule (10 mg total) by mouth daily. May take with 78m for a total of 464mif needed. 30 capsule 0  . lisdexamfetamine (VYVANSE) 30 MG capsule Take 1 capsule (30 mg total) by mouth every morning. May increase in increments of 10 mg at weekly intervals. 30 capsule 0  . lisdexamfetamine (VYVANSE) 30 MG capsule Take 1 capsule (30 mg total) by mouth daily. May increase in increments of 10 mg at weekly intervals. 30 capsule 0   No current facility-administered medications on file prior to visit.     Allergies    Allergen Reactions  . Adderall Xr [Amphetamine-Dextroamphet Er] Diarrhea  . Benadryl [Diphenhydramine] Itching  . Neosporin [Neomycin-Bacitracin Zn-Polymyx] Rash     Objective:  BP 98/66   Pulse (!) 105   Temp (!) 100.6 F (38.1 C) (Oral)   Resp 16   Ht 5' (1.524 m)   Wt 115 lb 3.2 oz (52.3 kg)   SpO2 97%   BMI 22.50 kg/m   Physical Exam  Constitutional: She is oriented to person, place, and time and well-developed, well-nourished, and in no distress. No distress.  Cardiovascular: Normal rate, regular rhythm and normal heart sounds.  Abdominal: Soft. There is no tenderness. There is no rebound and no guarding.  Neurological: She is alert and oriented to person, place, and time. GCS score is 15.  Skin: Skin is warm and dry.  Psychiatric: Mood, memory, affect and judgment normal.  Vitals reviewed.  Results for orders placed or performed in visit on 10/05/17  POCT CBC  Result Value Ref Range   WBC 15.5 (A) 4.6 - 10.2 K/uL   Lymph, poc 1.9 0.6 - 3.4   POC LYMPH PERCENT 12.4 10 - 50 %L   MID (cbc) 0.7 0 - 0.9   POC MID % 4.4 0 - 12 %M   POC Granulocyte 12.9 (A) 2 - 6.9   Granulocyte percent 83.2 (A) 37 - 80 %G   RBC 3.74 (A) 4.04 - 5.48 M/uL   Hemoglobin 12.1 (A) 12.2 - 16.2 g/dL   HCT, POC 35.8 (A) 37.7 - 47.9 %   MCV 95.6 80 - 97 fL   MCH, POC 32.2 (A) 27 - 31.2 pg   MCHC 33.8 31.8 - 35.4 g/dL   RDW, POC 13.5 %   Platelet Count, POC 556 (A) 142 - 424 K/uL   MPV 6.6 0 - 99.8 fL  POCT urinalysis dipstick  Result Value Ref Range   Color, UA red (A) yellow   Clarity, UA clear clear   Glucose, UA =100 (A) negative mg/dL   Bilirubin, UA small (A) negative   Ketones, POC UA small (15) (A) negative mg/dL   Spec Grav, UA 1.025 1.010 - 1.025   Blood, UA small (A) negative   pH, UA 6.0 5.0 - 8.0   Protein Ur, POC =100 (A) negative mg/dL   Urobilinogen, UA 0.2 0.2 or 1.0 E.U./dL   Nitrite, UA Negative Negative   Leukocytes, UA Negative Negative    Assessment and Plan  :  1. Diarrhea, unspecified type - POCT CBC - POCT urinalysis dipstick - CMP14+EGFR - PR NORMAL SALINE SOLUTION INFUS - PR CATH IMPL VASC ACCESS PORTAL - sodium chloride 0.9 % bolus 1,000 mL - sodium chloride 0.9 % bolus 1,000 mL - Pt here for f/u diarrhea. c/o diarrhea x 5 days. Suspect possible norovirus. Tachycardia and fever are improving. WBC elevated from 13.6 to 15.5. Platelets trending down. Blood pressure and weight is down from last  OV - lost 1 lb. 2 bags of NS administered. Stool culture and CMP are pending. Discussed necessity of fluid intake, con't Imodium as needed. RTC in 2 days for recheck. Emergency department precautions discussed.   Mercer Pod, PA-C  [facility] at [place] 10/05/2017 8:32 AM

## 2017-10-06 LAB — CMP14+EGFR
ALT: 10 IU/L (ref 0–32)
AST: 13 IU/L (ref 0–40)
Albumin/Globulin Ratio: 1.2 (ref 1.2–2.2)
Albumin: 3.6 g/dL (ref 3.6–4.8)
Alkaline Phosphatase: 65 IU/L (ref 39–117)
BUN/Creatinine Ratio: 8 — ABNORMAL LOW (ref 12–28)
BUN: 6 mg/dL — ABNORMAL LOW (ref 8–27)
Bilirubin Total: 0.5 mg/dL (ref 0.0–1.2)
CO2: 25 mmol/L (ref 20–29)
Calcium: 8.5 mg/dL — ABNORMAL LOW (ref 8.7–10.3)
Chloride: 88 mmol/L — ABNORMAL LOW (ref 96–106)
Creatinine, Ser: 0.75 mg/dL (ref 0.57–1.00)
GFR calc Af Amer: 99 mL/min/{1.73_m2} (ref >59)
GFR calc non Af Amer: 86 mL/min/{1.73_m2} (ref >59)
Globulin, Total: 3 g/dL (ref 1.5–4.5)
Glucose: 177 mg/dL — ABNORMAL HIGH (ref 65–99)
Potassium: 3.4 mmol/L — ABNORMAL LOW (ref 3.5–5.2)
Sodium: 130 mmol/L — ABNORMAL LOW (ref 134–144)
Total Protein: 6.6 g/dL (ref 6.0–8.5)

## 2017-10-07 LAB — STOOL CULTURE: E coli, Shiga toxin Assay: NEGATIVE

## 2017-10-08 ENCOUNTER — Ambulatory Visit (INDEPENDENT_AMBULATORY_CARE_PROVIDER_SITE_OTHER): Payer: BLUE CROSS/BLUE SHIELD

## 2017-10-08 ENCOUNTER — Other Ambulatory Visit: Payer: Self-pay

## 2017-10-08 ENCOUNTER — Encounter: Payer: Self-pay | Admitting: Physician Assistant

## 2017-10-08 ENCOUNTER — Ambulatory Visit (INDEPENDENT_AMBULATORY_CARE_PROVIDER_SITE_OTHER): Payer: BLUE CROSS/BLUE SHIELD | Admitting: Physician Assistant

## 2017-10-08 VITALS — BP 108/69 | HR 109 | Temp 98.8°F | Resp 16 | Ht 60.0 in | Wt 118.4 lb

## 2017-10-08 DIAGNOSIS — R197 Diarrhea, unspecified: Secondary | ICD-10-CM

## 2017-10-08 LAB — POCT CBC
Granulocyte percent: 76.6 %G (ref 37–80)
HCT, POC: 28.7 % — AB (ref 37.7–47.9)
Hemoglobin: 9.8 g/dL — AB (ref 12.2–16.2)
Lymph, poc: 1.7 (ref 0.6–3.4)
MCH, POC: 33 pg — AB (ref 27–31.2)
MCHC: 34.2 g/dL (ref 31.8–35.4)
MCV: 96.4 fL (ref 80–97)
MID (cbc): 0.9 (ref 0–0.9)
MPV: 6.7 fL (ref 0–99.8)
POC Granulocyte: 8.6 — AB (ref 2–6.9)
POC LYMPH PERCENT: 15.6 %L (ref 10–50)
POC MID %: 7.8 %M (ref 0–12)
Platelet Count, POC: 537 10*3/uL — AB (ref 142–424)
RBC: 2.98 M/uL — AB (ref 4.04–5.48)
RDW, POC: 13.4 %
WBC: 11.2 10*3/uL — AB (ref 4.6–10.2)

## 2017-10-08 MED ORDER — SODIUM CHLORIDE 0.9 % IV BOLUS (SEPSIS)
1000.0000 mL | Freq: Once | INTRAVENOUS | Status: AC
  Administered 2017-10-08: 1000 mL via INTRAVENOUS

## 2017-10-08 MED ORDER — DIPHENOXYLATE-ATROPINE 2.5-0.025 MG PO TABS: 2.0000 | ORAL_TABLET | Freq: Four times a day (QID) | ORAL | 0 refills | Status: DC | PRN

## 2017-10-08 NOTE — Progress Notes (Signed)
Anita Rice  MRN: [id number] DOB: 11-06-1955  PCP: [name], PA-C  Subjective:  Pt is a 61 year old female who presents to clinic for f/u diarrhea. This is her 3rd OV for this problem. This is her 8th day of having diarrhea. Still having 10+ episodes a day. Today she endorses some mild abdominal cramping. She is drinking water and eating soup broth. Still no apetite. Taking imodium daily. Denies diaphoreses, chills, fever, blood in her stool, vomiting, nausea, dizziness, palpitations.   Review of Systems  Constitutional: Negative for chills, diaphoresis, fatigue and fever.  Gastrointestinal: Positive for abdominal pain and diarrhea. Negative for abdominal distention, blood in stool, nausea and vomiting.    Patient Active Problem List   Diagnosis Date Noted  . Macrocytosis without anemia 06/01/2014  . Thrombocytosis ([place]) 06/01/2014  . Alcoholic peripheral neuropathy ([place]) 06/01/2014    Current Outpatient Medications on File Prior to Visit  Medication Sig Dispense Refill  . albuterol (PROVENTIL HFA;VENTOLIN HFA) 108 (90 Base) MCG/ACT inhaler Inhale 2 puffs into the lungs every 6 (six) hours as needed for wheezing or shortness of breath. 1 Inhaler 3  . budesonide-formoterol (SYMBICORT) 160-4.5 MCG/ACT inhaler Inhale 2 puffs into the lungs every morning. 1 Inhaler 6  . conjugated estrogens (PREMARIN) vaginal cream Place 1 Applicatorful vaginally daily. 42.5 g 12  . Ferrous Sulfate (IRON) 28 MG TABS Take 1 tablet by mouth every morning.     . fexofenadine (ALLEGRA) 180 MG tablet TAKE 1 TABLET BY MOUTH EVERY DAY 30 tablet 0  . fluocinolone (VANOS) 0.01 % cream Apply topically 2 (two) times daily. 30 g 0  . fluticasone (FLONASE) 50 MCG/ACT nasal spray USE 2 SPRAYS IN THE NOSE ONCE DAILY AS DIRECTED 48 g 4  . ibuprofen (ADVIL,MOTRIN) 200 MG tablet Take 400 mg by mouth every 6 (six) hours as needed for headache or mild pain.     Marland Kitchen lisdexamfetamine (VYVANSE) 10 MG capsule Take  1 capsule (10 mg total) by mouth daily. May take with 59m daily for total of 461m30 capsule 0  . lisdexamfetamine (VYVANSE) 30 MG capsule Take 1 capsule (30 mg total) by mouth daily. May increase in increments of 10 mg at weekly intervals. 30 capsule 0  . LORazepam (ATIVAN) 0.5 MG tablet Take 1 tablet (0.5 mg total) by mouth 2 (two) times daily as needed for anxiety. 60 tablet 0  . Multiple Vitamins-Minerals (CENTRUM SILVER PO) Take 1 tablet by mouth every morning.     . Merril Abbe0 MCG TABS vaginal tablet PLACE 1 TABLET (10 MCG TOTAL) VAGINALLY 2 (TWO) TIMES A WEEK. 8 tablet 1  . amphetamine-dextroamphetamine (ADDERALL) 10 MG tablet Take 1 tablet (10 mg total) by mouth 3 (three) times daily. 90 tablet 0  . atomoxetine (STRATTERA) 40 MG capsule Take 1 capsule (40 mg total) by mouth daily. May increase dose after 3 days. (Patient not taking: Reported on 10/08/2017) 30 capsule 3  . lisdexamfetamine (VYVANSE) 10 MG capsule Take 1 capsule (10 mg total) by mouth daily. May take with 3053mor a total of 27m51m. 30 capsule 0  . lisdexamfetamine (VYVANSE) 10 MG capsule Take 1 capsule (10 mg total) by mouth daily. May take with 30mg86m a total of 27mg 110meeded. 30 capsule 0  . lisdexamfetamine (VYVANSE) 30 MG capsule Take 1 capsule (30 mg total) by mouth every morning. May increase in increments of 10 mg at weekly intervals. 30 capsule 0  . lisdexamfetamine (VYVANSE) 30 MG  capsule Take 1 capsule (30 mg total) by mouth daily. May increase in increments of 10 mg at weekly intervals. 30 capsule 0   No current facility-administered medications on file prior to visit.     Allergies  Allergen Reactions  . Adderall Xr [Amphetamine-Dextroamphet Er] Diarrhea  . Benadryl [Diphenhydramine] Itching  . Neosporin [Neomycin-Bacitracin Zn-Polymyx] Rash     Objective:  BP 108/69   Pulse (!) 109   Temp 98.8 F (37.1 C) (Oral)   Resp 16   Ht 5' (1.524 m)   Wt 118 lb 6.4 oz (53.7 kg)   SpO2 95%   BMI 23.12  kg/m   Physical Exam  Constitutional: She is oriented to person, place, and time and well-developed, well-nourished, and in no distress. No distress.  Cardiovascular: Normal rate, regular rhythm and normal heart sounds.  Abdominal: Soft. Normal appearance. There is generalized tenderness (mild). There is no rigidity and no guarding.  Neurological: She is alert and oriented to person, place, and time. GCS score is 15.  Skin: Skin is warm and dry.  Psychiatric: Mood, memory, affect and judgment normal.  Vitals reviewed.   Dg Abd 2 Views  Result Date: 10/08/2017 CLINICAL DATA:  Diarrhea. EXAM: ABDOMEN - 2 VIEW COMPARISON:  None. FINDINGS: There is no free intraperitoneal air. Mild gaseous distention of the colon with air-fluid levels is identified. No evidence small bowel obstruction. Convex left scoliosis noted. IMPRESSION: Air-fluid levels in the colon consistent with history of diarrhea. Negative for small bowel obstruction or free intraperitoneal air. Electronically Signed   By: Inge Rise M.D.   On: 10/08/2017 13:30   Results for orders placed or performed in visit on 10/08/17  POCT CBC  Result Value Ref Range   WBC 11.2 (A) 4.6 - 10.2 K/uL   Lymph, poc 1.7 0.6 - 3.4   POC LYMPH PERCENT 15.6 10 - 50 %L   MID (cbc) 0.9 0 - 0.9   POC MID % 7.8 0 - 12 %M   POC Granulocyte 8.6 (A) 2 - 6.9   Granulocyte percent 76.6 37 - 80 %G   RBC 2.98 (A) 4.04 - 5.48 M/uL   Hemoglobin 9.8 (A) 12.2 - 16.2 g/dL   HCT, POC 28.7 (A) 37.7 - 47.9 %   MCV 96.4 80 - 97 fL   MCH, POC 33.0 (A) 27 - 31.2 pg   MCHC 34.2 31.8 - 35.4 g/dL   RDW, POC 13.4 %   Platelet Count, POC 537 (A) 142 - 424 K/uL   MPV 6.7 0 - 99.8 fL    Assessment and Plan :  1. Diarrhea, unspecified type - CMP14+EGFR - POCT CBC - PR NORMAL SALINE SOLUTION INFUS - PR CATH IMPL VASC ACCESS PORTAL - GI Profile, Stool, PCR - sodium chloride 0.9 % bolus 1,000 mL - sodium chloride 0.9 % bolus 1,000 mL - DG Abd 2 Views;  Future - diphenoxylate-atropine (LOMOTIL) 2.5-0.025 MG tablet; Take 2 tablets by mouth 4 (four) times daily as needed for diarrhea or loose stools.  Dispense: 30 tablet; Refill: 0 - Pt c/o diarrhea x 8 days. Vital signs and WBC count are improving. Weight is back up from last OV. Abdominal X-ray is negative. She is tolerating oral fluids well. IV fluids administered today. RTC in 48 hours for recheck.   Mercer Pod, PA-C  [facility] at [place] 10/08/2017 11:17 AM

## 2017-10-08 NOTE — Patient Instructions (Addendum)
  If you would like an alternative to Imodium, you can start Lomotil instead. Diphenoxylate (Lomotil) is an alternative antimotility agent, but it has not been as well studied. The dose of diphenoxylate is two tablets (4 mg) four times daily for ?2 days.   Consider taking a probiotic.   Continue staying well hydrated. Be sure to drink fluids with electrolytes. Eat as tolerated: Broiled starches/cereals (potatoes, noodles, rice, wheat, and oat) with some salt are excellent foods to consider. In addition, crackers, bananas, yogurt, soups, and boiled vegetables.  Stay well hydrated with water, sports drinks and broths. Soft drinks and fruit juices that are high in sugar content should be avoided.  Continue taking Imodium as needed. In general, viral gastroenteritis is an acute and self-limited disease that does not require pharmacologic therapy. It is important to remember that adequate fluid repletion is the mainstay of treatment of acute viral gastroenteritis and that any pharmacologic agents are to be used as adjuncts  Your symptoms may last up to 2 weeks.   Come back and see me on Thursday.   Thank you for coming in today. I hope you feel we met your needs.  Feel free to call PCP if you have any questions or further requests.  Please consider signing up for MyChart if you do not already have it, as this is a great way to communicate with me.  Best,  Whitney McVey, PA-C   IF you received an x-ray today, you will receive an invoice from [facility]. Please contact [facility] at [phone] with questions or concerns regarding your invoice.   IF you received labwork today, you will receive an invoice from [place]. Please contact [facility] at [phone] with questions or concerns regarding your invoice.   Our billing staff will not be able to assist you with questions regarding bills from these companies.  You will be contacted with the lab results as soon as they are  available. The fastest way to get your results is to activate your My Chart account. Instructions are located on the last page of this paperwork. If you have not heard from [place] regarding the results in 2 weeks, please contact this office.

## 2017-10-09 LAB — CMP14+EGFR
ALT: 11 IU/L (ref 0–32)
AST: 16 IU/L (ref 0–40)
Albumin/Globulin Ratio: 0.9 — ABNORMAL LOW (ref 1.2–2.2)
Albumin: 2.6 g/dL — ABNORMAL LOW (ref 3.6–4.8)
Alkaline Phosphatase: 55 IU/L (ref 39–117)
BUN/Creatinine Ratio: 11 — ABNORMAL LOW (ref 12–28)
BUN: 7 mg/dL — ABNORMAL LOW (ref 8–27)
Bilirubin Total: 1.2 mg/dL (ref 0.0–1.2)
CO2: 24 mmol/L (ref 20–29)
Calcium: 7.5 mg/dL — ABNORMAL LOW (ref 8.7–10.3)
Chloride: 85 mmol/L — ABNORMAL LOW (ref 96–106)
Creatinine, Ser: 0.61 mg/dL (ref 0.57–1.00)
GFR calc Af Amer: 113 mL/min/{1.73_m2} (ref >59)
GFR calc non Af Amer: 98 mL/min/{1.73_m2} (ref >59)
Globulin, Total: 2.8 g/dL (ref 1.5–4.5)
Glucose: 127 mg/dL — ABNORMAL HIGH (ref 65–99)
Potassium: 3.2 mmol/L — ABNORMAL LOW (ref 3.5–5.2)
Sodium: 123 mmol/L — ABNORMAL LOW (ref 134–144)
Total Protein: 5.4 g/dL — ABNORMAL LOW (ref 6.0–8.5)

## 2017-10-09 LAB — GI PROFILE, STOOL, PCR

## 2017-10-11 ENCOUNTER — Ambulatory Visit (INDEPENDENT_AMBULATORY_CARE_PROVIDER_SITE_OTHER): Payer: BLUE CROSS/BLUE SHIELD | Admitting: Physician Assistant

## 2017-10-11 ENCOUNTER — Other Ambulatory Visit: Payer: Self-pay

## 2017-10-11 ENCOUNTER — Encounter: Payer: Self-pay | Admitting: Physician Assistant

## 2017-10-11 VITALS — BP 116/64 | HR 98 | Temp 98.4°F | Resp 16 | Ht 60.0 in | Wt 117.0 lb

## 2017-10-11 DIAGNOSIS — D649 Anemia, unspecified: Secondary | ICD-10-CM | POA: Diagnosis not present

## 2017-10-11 DIAGNOSIS — R197 Diarrhea, unspecified: Secondary | ICD-10-CM

## 2017-10-11 LAB — POCT CBC
Granulocyte percent: 65.5 % (ref 37–80)
HCT, POC: 28.8 % — AB (ref 37.7–47.9)
Hemoglobin: 9.6 g/dL — AB (ref 12.2–16.2)
Lymph, poc: 3.4 (ref 0.6–3.4)
MCH, POC: 32.9 pg — AB (ref 27–31.2)
MCHC: 33.5 g/dL (ref 31.8–35.4)
MCV: 98.2 fL — AB (ref 80–97)
MID (cbc): 1.1 — AB (ref 0–0.9)
MPV: 7.1 fL (ref 0–99.8)
POC Granulocyte: 8.4 — AB (ref 2–6.9)
POC LYMPH PERCENT: 26 % (ref 10–50)
POC MID %: 8.5 %M (ref 0–12)
Platelet Count, POC: 562 10*3/uL — AB (ref 142–424)
RBC: 2.93 M/uL — AB (ref 4.04–5.48)
RDW, POC: 13.9 %
WBC: 12.9 10*3/uL — AB (ref 4.6–10.2)

## 2017-10-11 LAB — OVA AND PARASITE EXAMINATION

## 2017-10-11 NOTE — Progress Notes (Signed)
Anita Rice  MRN: [id number] DOB: November 29, 1955  PCP: [name], PA-C  Subjective:  Pt is a 61 year old female who presents to clinic for f/u diarrhea. This is her fourth OV for this problem. She feels like she turned a corner yesterday. She has had a few formed stools, however is still having some diarrhea. She is getting an appetite back. con't to c/o abdominal cramping. Denies blood in her stool, fever chills, diaphoresis, lightheadedness, dizziness, palpitations.   H/o colitis. Last episode "quite a while ago"   Review of Systems  Constitutional: Negative for chills, diaphoresis, fatigue and fever.  Cardiovascular: Negative for palpitations.  Gastrointestinal: Positive for abdominal pain and diarrhea. Negative for abdominal distention, blood in stool, nausea, rectal pain and vomiting.  Psychiatric/Behavioral: Negative for sleep disturbance.    Patient Active Problem List   Diagnosis Date Noted  . Macrocytosis without anemia 06/01/2014  . Thrombocytosis ([place]) 06/01/2014  . Alcoholic peripheral neuropathy ([place]) 06/01/2014    Current Outpatient Medications on File Prior to Visit  Medication Sig Dispense Refill  . albuterol (PROVENTIL HFA;VENTOLIN HFA) 108 (90 Base) MCG/ACT inhaler Inhale 2 puffs into the lungs every 6 (six) hours as needed for wheezing or shortness of breath. 1 Inhaler 3  . budesonide-formoterol (SYMBICORT) 160-4.5 MCG/ACT inhaler Inhale 2 puffs into the lungs every morning. 1 Inhaler 6  . conjugated estrogens (PREMARIN) vaginal cream Place 1 Applicatorful vaginally daily. 42.5 g 12  . diphenoxylate-atropine (LOMOTIL) 2.5-0.025 MG tablet Take 2 tablets by mouth 4 (four) times daily as needed for diarrhea or loose stools. 30 tablet 0  . Ferrous Sulfate (IRON) 28 MG TABS Take 1 tablet by mouth every morning.     . fexofenadine (ALLEGRA) 180 MG tablet TAKE 1 TABLET BY MOUTH EVERY DAY 30 tablet 0  . fluocinolone (VANOS) 0.01 % cream Apply topically 2 (two)  times daily. 30 g 0  . fluticasone (FLONASE) 50 MCG/ACT nasal spray USE 2 SPRAYS IN THE NOSE ONCE DAILY AS DIRECTED 48 g 4  . ibuprofen (ADVIL,MOTRIN) 200 MG tablet Take 400 mg by mouth every 6 (six) hours as needed for headache or mild pain.     Marland Kitchen lisdexamfetamine (VYVANSE) 10 MG capsule Take 1 capsule (10 mg total) by mouth daily. May take with 54m daily for total of 466m30 capsule 0  . lisdexamfetamine (VYVANSE) 30 MG capsule Take 1 capsule (30 mg total) by mouth daily. May increase in increments of 10 mg at weekly intervals. 30 capsule 0  . LORazepam (ATIVAN) 0.5 MG tablet Take 1 tablet (0.5 mg total) by mouth 2 (two) times daily as needed for anxiety. 60 tablet 0  . Multiple Vitamins-Minerals (CENTRUM SILVER PO) Take 1 tablet by mouth every morning.     . Merril Abbe0 MCG TABS vaginal tablet PLACE 1 TABLET (10 MCG TOTAL) VAGINALLY 2 (TWO) TIMES A WEEK. 8 tablet 1  . amphetamine-dextroamphetamine (ADDERALL) 10 MG tablet Take 1 tablet (10 mg total) by mouth 3 (three) times daily. 90 tablet 0  . atomoxetine (STRATTERA) 40 MG capsule Take 1 capsule (40 mg total) by mouth daily. May increase dose after 3 days. (Patient not taking: Reported on 10/11/2017) 30 capsule 3  . lisdexamfetamine (VYVANSE) 10 MG capsule Take 1 capsule (10 mg total) by mouth daily. May take with 3068mor a total of 43m92m. 30 capsule 0  . lisdexamfetamine (VYVANSE) 10 MG capsule Take 1 capsule (10 mg total) by mouth daily. May take with 30mg88m  a total of 66m if needed. 30 capsule 0  . lisdexamfetamine (VYVANSE) 30 MG capsule Take 1 capsule (30 mg total) by mouth every morning. May increase in increments of 10 mg at weekly intervals. 30 capsule 0  . lisdexamfetamine (VYVANSE) 30 MG capsule Take 1 capsule (30 mg total) by mouth daily. May increase in increments of 10 mg at weekly intervals. 30 capsule 0   No current facility-administered medications on file prior to visit.     Allergies  Allergen Reactions  . Adderall Xr  [Amphetamine-Dextroamphet Er] Diarrhea  . Benadryl [Diphenhydramine] Itching  . Neosporin [Neomycin-Bacitracin Zn-Polymyx] Rash     Objective:  BP 116/64   Pulse 98   Temp 98.4 F (36.9 C) (Oral)   Resp 16   Ht 5' (1.524 m)   Wt 117 lb (53.1 kg)   SpO2 99%   BMI 22.85 kg/m   Wt Readings from Last 3 Encounters:  10/11/17 117 lb (53.1 kg)  10/08/17 118 lb 6.4 oz (53.7 kg)  10/05/17 115 lb 3.2 oz (52.3 kg)   Temp Readings from Last 3 Encounters:  10/11/17 98.4 F (36.9 C) (Oral)  10/08/17 98.8 F (37.1 C) (Oral)  10/05/17 (!) 100.6 F (38.1 C) (Oral)   BP Readings from Last 3 Encounters:  10/11/17 116/64  10/08/17 108/69  10/05/17 98/66   Pulse Readings from Last 3 Encounters:  10/11/17 98  10/08/17 (!) 109  10/05/17 (!) 105     Physical Exam  Constitutional: She is oriented to person, place, and time and well-developed, well-nourished, and in no distress. No distress.  Cardiovascular: Normal rate, regular rhythm and normal heart sounds.  Abdominal: Soft. Normal appearance. There is generalized tenderness (mild).  Neurological: She is alert and oriented to person, place, and time. GCS score is 15.  Skin: Skin is warm and dry.  Psychiatric: Mood, memory, affect and judgment normal.  Vitals reviewed.  Lab Results  Component Value Date   WBC 12.9 (H) 10/11/2017   HGB 9.7 (L) 10/11/2017   HCT 29.6 (L) 10/11/2017   MCV 98 (H) 10/11/2017   PLT 678 (H) 10/11/2017    Assessment and Plan :  1. Diarrhea, unspecified type - POCT CBC - CMP14+EGFR - Pt presents for diarrhea x 11 days. She is starting to see formed stools again, however still experiencing >5 episode of diarrhea daily. Stool culture, GI profile, ova and parasites are negative. Her WBC count con't to remain elevated. Her vital signs are stable. Hgb and HCT are not improving, plan to get iron panel, path smear and TSH. Advance diet slowly. She has h/o colitis. Consider prednisone if not improving. RTC in 1  week for f/u.    WMercer Pod PA-C  [facility] [place]/27/2018 11:31 AM

## 2017-10-11 NOTE — Patient Instructions (Addendum)
Try to get more protein in your diet. Increase your solid food intake slowly - do not eat spicy, heavy, greasy foods too soon. Stick to more bland meals for about 1 week. Continue staying well hydrated.  Come back and see me next week.   Thank you for coming in today. I hope you feel we met your needs.  Feel free to call PCP if you have any questions or further requests.  Please consider signing up for MyChart if you do not already have it, as this is a great way to communicate with me.  Best,  Whitney McVey, PA-C   IF you received an x-ray today, you will receive an invoice from [facility]. Please contact [facility] at [phone] with questions or concerns regarding your invoice.   IF you received labwork today, you will receive an invoice from [place]. Please contact [facility] at [phone] with questions or concerns regarding your invoice.   Our billing staff will not be able to assist you with questions regarding bills from these companies.  You will be contacted with the lab results as soon as they are available. The fastest way to get your results is to activate your My Chart account. Instructions are located on the last page of this paperwork. If you have not heard from [place] regarding the results in 2 weeks, please contact this office.

## 2017-10-12 ENCOUNTER — Encounter: Payer: Self-pay | Admitting: Physician Assistant

## 2017-10-12 ENCOUNTER — Other Ambulatory Visit: Payer: Self-pay | Admitting: Physician Assistant

## 2017-10-12 DIAGNOSIS — K529 Noninfective gastroenteritis and colitis, unspecified: Secondary | ICD-10-CM

## 2017-10-12 LAB — CMP14+EGFR
ALT: 19 IU/L (ref 0–32)
AST: 30 IU/L (ref 0–40)
Albumin/Globulin Ratio: 0.8 — ABNORMAL LOW (ref 1.2–2.2)
Albumin: 2.7 g/dL — ABNORMAL LOW (ref 3.6–4.8)
Alkaline Phosphatase: 61 IU/L (ref 39–117)
BUN/Creatinine Ratio: 11 — ABNORMAL LOW (ref 12–28)
BUN: 6 mg/dL — ABNORMAL LOW (ref 8–27)
Bilirubin Total: 0.9 mg/dL (ref 0.0–1.2)
CO2: 27 mmol/L (ref 20–29)
Calcium: 7.7 mg/dL — ABNORMAL LOW (ref 8.7–10.3)
Chloride: 87 mmol/L — ABNORMAL LOW (ref 96–106)
Creatinine, Ser: 0.54 mg/dL — ABNORMAL LOW (ref 0.57–1.00)
GFR calc Af Amer: 118 mL/min/{1.73_m2} (ref >59)
GFR calc non Af Amer: 102 mL/min/{1.73_m2} (ref >59)
Globulin, Total: 3.3 g/dL (ref 1.5–4.5)
Glucose: 81 mg/dL (ref 65–99)
Potassium: 3.6 mmol/L (ref 3.5–5.2)
Sodium: 130 mmol/L — ABNORMAL LOW (ref 134–144)
Total Protein: 6 g/dL (ref 6.0–8.5)

## 2017-10-12 LAB — PATHOLOGIST SMEAR REVIEW
Basophils Absolute: 0.2 10*3/uL (ref 0.0–0.2)
Basos: 2 %
EOS (ABSOLUTE): 0.3 10*3/uL (ref 0.0–0.4)
Eos: 3 %
Hematocrit: 29.6 % — ABNORMAL LOW (ref 34.0–46.6)
Hemoglobin: 9.7 g/dL — ABNORMAL LOW (ref 11.1–15.9)
Immature Grans (Abs): 0.1 10*3/uL (ref 0.0–0.1)
Immature Granulocytes: 1 %
Lymphocytes Absolute: 3.3 10*3/uL — ABNORMAL HIGH (ref 0.7–3.1)
Lymphs: 26 %
MCH: 32.1 pg (ref 26.6–33.0)
MCHC: 32.8 g/dL (ref 31.5–35.7)
MCV: 98 fL — ABNORMAL HIGH (ref 79–97)
Monocytes Absolute: 1.9 10*3/uL — ABNORMAL HIGH (ref 0.1–0.9)
Monocytes: 15 %
Neutrophils Absolute: 6.9 10*3/uL (ref 1.4–7.0)
Neutrophils: 53 %
Platelets: 678 10*3/uL — ABNORMAL HIGH (ref 150–379)
RBC: 3.02 x10E6/uL — ABNORMAL LOW (ref 3.77–5.28)
RDW: 14.1 % (ref 12.3–15.4)
WBC: 12.9 10*3/uL — ABNORMAL HIGH (ref 3.4–10.8)

## 2017-10-12 LAB — IRON,TIBC AND FERRITIN PANEL
Ferritin: 1303 ng/mL — ABNORMAL HIGH (ref 15–150)
Iron Saturation: 17 % (ref 15–55)
Iron: 29 ug/dL (ref 27–139)
Total Iron Binding Capacity: 170 ug/dL — ABNORMAL LOW (ref 250–450)
UIBC: 141 ug/dL (ref 118–369)

## 2017-10-12 LAB — TSH: TSH: 0.689 u[IU]/mL (ref 0.450–4.500)

## 2017-10-12 MED ORDER — PREDNISONE 20 MG PO TABS: ORAL_TABLET | ORAL | 0 refills | Status: DC

## 2017-10-17 ENCOUNTER — Encounter: Payer: Self-pay | Admitting: Physician Assistant

## 2017-10-17 ENCOUNTER — Other Ambulatory Visit: Payer: Self-pay

## 2017-10-17 ENCOUNTER — Ambulatory Visit (INDEPENDENT_AMBULATORY_CARE_PROVIDER_SITE_OTHER): Payer: BLUE CROSS/BLUE SHIELD | Admitting: Physician Assistant

## 2017-10-17 VITALS — BP 108/62 | HR 85 | Temp 97.9°F | Resp 16 | Ht 60.0 in | Wt 111.0 lb

## 2017-10-17 DIAGNOSIS — F988 Other specified behavioral and emotional disorders with onset usually occurring in childhood and adolescence: Secondary | ICD-10-CM | POA: Diagnosis not present

## 2017-10-17 DIAGNOSIS — D649 Anemia, unspecified: Secondary | ICD-10-CM | POA: Diagnosis not present

## 2017-10-17 DIAGNOSIS — R197 Diarrhea, unspecified: Secondary | ICD-10-CM

## 2017-10-17 DIAGNOSIS — F411 Generalized anxiety disorder: Secondary | ICD-10-CM | POA: Diagnosis not present

## 2017-10-17 LAB — POCT CBC
Granulocyte percent: 75.5 %G (ref 37–80)
HCT, POC: 33.1 % — AB (ref 37.7–47.9)
Hemoglobin: 10.3 g/dL — AB (ref 12.2–16.2)
Lymph, poc: 2.6 (ref 0.6–3.4)
MCH, POC: 31.9 pg — AB (ref 27–31.2)
MCHC: 31.2 g/dL — AB (ref 31.8–35.4)
MCV: 102.2 fL — AB (ref 80–97)
MID (cbc): 0.6 (ref 0–0.9)
MPV: 6.5 fL (ref 0–99.8)
POC Granulocyte: 9.9 — AB (ref 2–6.9)
POC LYMPH PERCENT: 19.8 %L (ref 10–50)
POC MID %: 4.7 %M (ref 0–12)
Platelet Count, POC: 885 10*3/uL — AB (ref 142–424)
RBC: 3.24 M/uL — AB (ref 4.04–5.48)
RDW, POC: 15.7 %
WBC: 13.1 10*3/uL — AB (ref 4.6–10.2)

## 2017-10-17 MED ORDER — LORAZEPAM 0.5 MG PO TABS: 0.5000 mg | ORAL_TABLET | Freq: Two times a day (BID) | ORAL | 0 refills | Status: DC | PRN

## 2017-10-17 MED ORDER — LORAZEPAM 0.5 MG PO TABS: 0.5000 mg | ORAL_TABLET | Freq: Two times a day (BID) | ORAL | 0 refills | Status: AC | PRN

## 2017-10-17 MED ORDER — AMPHETAMINE-DEXTROAMPHETAMINE 10 MG PO TABS: 10.0000 mg | ORAL_TABLET | Freq: Two times a day (BID) | ORAL | 0 refills | Status: DC

## 2017-10-17 MED ORDER — AMPHETAMINE-DEXTROAMPHETAMINE 10 MG PO TABS: 10.0000 mg | ORAL_TABLET | Freq: Every day | ORAL | 0 refills | Status: DC

## 2017-10-17 MED ORDER — AMPHETAMINE-DEXTROAMPHET ER 30 MG PO CP24: 30.0000 mg | ORAL_CAPSULE | ORAL | 0 refills | Status: DC

## 2017-10-17 NOTE — Progress Notes (Signed)
Anita Rice  MRN: [id number] DOB: 1955/11/29  PCP: [name], PA-C  Subjective:  Pt is a 62 year old female who presents to clinic for f/u diarrhea. She endorses improvement. She is having 6 episodes daily, stools are now more formed. She reports that she is feeling 95% better.  Finished Prednisone a few days ago.  She has a great appetite.  ROS below.   Needs refill of Adderall 14m XR, 178mSR and Lorazepam 0.67m27m  Review of Systems  Constitutional: Negative for chills, diaphoresis, fatigue and fever.  Gastrointestinal: Positive for diarrhea. Negative for abdominal pain, constipation, nausea and vomiting.  Neurological: Negative for dizziness, weakness, light-headedness and headaches.    Patient Active Problem List   Diagnosis Date Noted  . Macrocytosis without anemia 06/01/2014  . Thrombocytosis ([place]8/17/2015  . Alcoholic peripheral neuropathy ([place]CC[place]8/17/2015    Current Outpatient Medications on File Prior to Visit  Medication Sig Dispense Refill  . albuterol (PROVENTIL HFA;VENTOLIN HFA) 108 (90 Base) MCG/ACT inhaler Inhale 2 puffs into the lungs every 6 (six) hours as needed for wheezing or shortness of breath. 1 Inhaler 3  . budesonide-formoterol (SYMBICORT) 160-4.5 MCG/ACT inhaler Inhale 2 puffs into the lungs every morning. 1 Inhaler 6  . conjugated estrogens (PREMARIN) vaginal cream Place 1 Applicatorful vaginally daily. 42.5 g 12  . diphenoxylate-atropine (LOMOTIL) 2.5-0.025 MG tablet Take 2 tablets by mouth 4 (four) times daily as needed for diarrhea or loose stools. 30 tablet 0  . Ferrous Sulfate (IRON) 28 MG TABS Take 1 tablet by mouth every morning.     . fexofenadine (ALLEGRA) 180 MG tablet TAKE 1 TABLET BY MOUTH EVERY DAY 30 tablet 0  . fluocinolone (VANOS) 0.01 % cream Apply topically 2 (two) times daily. 30 g 0  . fluticasone (FLONASE) 50 MCG/ACT nasal spray USE 2 SPRAYS IN THE NOSE ONCE DAILY AS DIRECTED 48 g 4  . ibuprofen (ADVIL,MOTRIN)  200 MG tablet Take 400 mg by mouth every 6 (six) hours as needed for headache or mild pain.     . lMarland Kitchensdexamfetamine (VYVANSE) 10 MG capsule Take 1 capsule (10 mg total) by mouth daily. May take with 89m64mily for total of 40mg109mcapsule 0  . lisdexamfetamine (VYVANSE) 30 MG capsule Take 1 capsule (30 mg total) by mouth daily. May increase in increments of 10 mg at weekly intervals. 30 capsule 0  . LORazepam (ATIVAN) 0.5 MG tablet Take 1 tablet (0.5 mg total) by mouth 2 (two) times daily as needed for anxiety. 60 tablet 0  . Multiple Vitamins-Minerals (CENTRUM SILVER PO) Take 1 tablet by mouth every morning.     . predniSONE (DELTASONE) 20 MG tablet Take 3 pills x 5 days; 2 pills x 5 days; 1 pill x 5 days 30 tablet 0  . YUVAFEM 10 MCG TABS vaginal tablet PLACE 1 TABLET (10 MCG TOTAL) VAGINALLY 2 (TWO) TIMES A WEEK. 8 tablet 1  . amphetamine-dextroamphetamine (ADDERALL) 10 MG tablet Take 1 tablet (10 mg total) by mouth 3 (three) times daily. 90 tablet 0  . atomoxetine (STRATTERA) 40 MG capsule Take 1 capsule (40 mg total) by mouth daily. May increase dose after 3 days. (Patient not taking: Reported on 10/17/2017) 30 capsule 3  . lisdexamfetamine (VYVANSE) 10 MG capsule Take 1 capsule (10 mg total) by mouth daily. May take with 89mg 66ma total of 40mg q32m0 capsule 0  . lisdexamfetamine (VYVANSE) 10 MG capsule Take 1 capsule (10 mg total) by  mouth daily. May take with 41m for a total of 462mif needed. 30 capsule 0  . lisdexamfetamine (VYVANSE) 30 MG capsule Take 1 capsule (30 mg total) by mouth every morning. May increase in increments of 10 mg at weekly intervals. 30 capsule 0  . lisdexamfetamine (VYVANSE) 30 MG capsule Take 1 capsule (30 mg total) by mouth daily. May increase in increments of 10 mg at weekly intervals. 30 capsule 0   No current facility-administered medications on file prior to visit.     Allergies  Allergen Reactions  . Adderall Xr [Amphetamine-Dextroamphet Er] Diarrhea  .  Benadryl [Diphenhydramine] Itching  . Neosporin [Neomycin-Bacitracin Zn-Polymyx] Rash     Objective:  BP 108/62   Pulse 85   Temp 97.9 F (36.6 C) (Oral)   Resp 16   Ht 5' (1.524 m)   Wt 111 lb (50.3 kg)   SpO2 98%   BMI 21.68 kg/m   Physical Exam  Constitutional: She is oriented to person, place, and time and well-developed, well-nourished, and in no distress. No distress.  Cardiovascular: Normal rate, regular rhythm and normal heart sounds.  Abdominal: Soft. Normal appearance. There is no tenderness.  Neurological: She is alert and oriented to person, place, and time. GCS score is 15.  Skin: Skin is warm and dry.  Psychiatric: Mood, memory, affect and judgment normal.  Vitals reviewed.  Results for orders placed or performed in visit on 10/17/17  POCT CBC  Result Value Ref Range   WBC 13.1 (A) 4.6 - 10.2 K/uL   Lymph, poc 2.6 0.6 - 3.4   POC LYMPH PERCENT 19.8 10 - 50 %L   MID (cbc) 0.6 0 - 0.9   POC MID % 4.7 0 - 12 %M   POC Granulocyte 9.9 (A) 2 - 6.9   Granulocyte percent 75.5 37 - 80 %G   RBC 3.24 (A) 4.04 - 5.48 M/uL   Hemoglobin 10.3 (A) 12.2 - 16.2 g/dL   HCT, POC 33.1 (A) 37.7 - 47.9 %   MCV 102.2 (A) 80 - 97 fL   MCH, POC 31.9 (A) 27 - 31.2 pg   MCHC 31.2 (A) 31.8 - 35.4 g/dL   RDW, POC 15.7 %   Platelet Count, POC 885 (A) 142 - 424 K/uL   MPV 6.5 0 - 99.8 fL    Assessment and Plan :  1. Diarrhea, unspecified type - POCT CBC - CMP14+EGFR - Pt c/o diarrhea x 2 weeks. Today she feels 95% better. Stools are now formed. Her appetite is back in full swing. Labs however have not normalized, inflammatory markers still elevated. WBC is still elevated. Today's labs are pending. Plan to have her come back in 2 weeks for lab only visit. RTC sooner if symptoms worsen.  2. Anemia, unspecified type - Iron, TIBC and Ferritin Panel - Vitamin B12   WhMercer PodPA-C  [facility] [place]/11/2017 9:45 AM

## 2017-10-17 NOTE — Patient Instructions (Addendum)
Come back in 2-3 weeks for a LAB ONLY visit. Just come in and have labs drawn. You do not need to check in and see me.   Call in in 3 months to receive Adderall and Ativan refill.    Thank you for coming in today. I hope you feel we met your needs.  Feel free to call PCP if you have any questions or further requests.  Please consider signing up for MyChart if you do not already have it, as this is a great way to communicate with me.  Best,  Whitney McVey, PA-C   IF you received an x-ray today, you will receive an invoice from [facility] Radiology. Please contact [facility] Radiology at [phone] with questions or concerns regarding your invoice.   IF you received labwork today, you will receive an invoice from [place]. Please contact [facility] at [phone] with questions or concerns regarding your invoice.   Our billing staff will not be able to assist you with questions regarding bills from these companies.  You will be contacted with the lab results as soon as they are available. The fastest way to get your results is to activate your My Chart account. Instructions are located on the last page of this paperwork. If you have not heard from [place] regarding the results in 2 weeks, please contact this office.

## 2017-10-18 LAB — CMP14+EGFR
ALT: 26 IU/L (ref 0–32)
AST: 22 IU/L (ref 0–40)
Albumin/Globulin Ratio: 0.9 — ABNORMAL LOW (ref 1.2–2.2)
Albumin: 3.4 g/dL — ABNORMAL LOW (ref 3.6–4.8)
Alkaline Phosphatase: 67 IU/L (ref 39–117)
BUN/Creatinine Ratio: 12 (ref 12–28)
BUN: 9 mg/dL (ref 8–27)
Bilirubin Total: <0.2 mg/dL (ref 0.0–1.2)
CO2: 24 mmol/L (ref 20–29)
Calcium: 8.7 mg/dL (ref 8.7–10.3)
Chloride: 96 mmol/L (ref 96–106)
Creatinine, Ser: 0.77 mg/dL (ref 0.57–1.00)
GFR calc Af Amer: 96 mL/min/{1.73_m2} (ref >59)
GFR calc non Af Amer: 84 mL/min/{1.73_m2} (ref >59)
Globulin, Total: 3.6 g/dL (ref 1.5–4.5)
Glucose: 93 mg/dL (ref 65–99)
Potassium: 4.9 mmol/L (ref 3.5–5.2)
Sodium: 134 mmol/L (ref 134–144)
Total Protein: 7 g/dL (ref 6.0–8.5)

## 2017-10-18 LAB — IRON,TIBC AND FERRITIN PANEL
Ferritin: 437 ng/mL — ABNORMAL HIGH (ref 15–150)
Iron Saturation: 17 % (ref 15–55)
Iron: 44 ug/dL (ref 27–139)
Total Iron Binding Capacity: 255 ug/dL (ref 250–450)
UIBC: 211 ug/dL (ref 118–369)

## 2017-10-18 LAB — VITAMIN B12: Vitamin B-12: >2000 pg/mL — ABNORMAL HIGH (ref 232–1245)

## 2017-10-19 ENCOUNTER — Encounter: Payer: Self-pay | Admitting: Physician Assistant

## 2017-10-19 ENCOUNTER — Other Ambulatory Visit: Payer: Self-pay | Admitting: Physician Assistant

## 2017-10-22 ENCOUNTER — Encounter: Payer: Self-pay | Admitting: Physician Assistant

## 2017-10-23 ENCOUNTER — Other Ambulatory Visit: Payer: Self-pay | Admitting: Physician Assistant

## 2017-10-23 DIAGNOSIS — K529 Noninfective gastroenteritis and colitis, unspecified: Secondary | ICD-10-CM

## 2017-10-23 MED ORDER — PREDNISONE 20 MG PO TABS: ORAL_TABLET | ORAL | 0 refills | Status: DC

## 2017-10-24 ENCOUNTER — Encounter: Payer: Self-pay | Admitting: Physician Assistant

## 2017-10-27 ENCOUNTER — Ambulatory Visit: Payer: BLUE CROSS/BLUE SHIELD | Admitting: Urgent Care

## 2017-10-27 DIAGNOSIS — D649 Anemia, unspecified: Secondary | ICD-10-CM | POA: Diagnosis not present

## 2017-10-27 DIAGNOSIS — R197 Diarrhea, unspecified: Secondary | ICD-10-CM

## 2017-10-27 NOTE — Progress Notes (Signed)
I did not examine this patient.

## 2017-10-28 LAB — CBC WITH DIFFERENTIAL/PLATELET
Basophils Absolute: 0 10*3/uL (ref 0.0–0.2)
Basos: 0 %
EOS (ABSOLUTE): 0.1 10*3/uL (ref 0.0–0.4)
Eos: 2 %
Hematocrit: 36.1 % (ref 34.0–46.6)
Hemoglobin: 11.4 g/dL (ref 11.1–15.9)
Immature Grans (Abs): 0.1 10*3/uL (ref 0.0–0.1)
Immature Granulocytes: 1 %
Lymphocytes Absolute: 1.9 10*3/uL (ref 0.7–3.1)
Lymphs: 22 %
MCH: 32.9 pg (ref 26.6–33.0)
MCHC: 31.6 g/dL (ref 31.5–35.7)
MCV: 104 fL — ABNORMAL HIGH (ref 79–97)
Monocytes Absolute: 0.4 10*3/uL (ref 0.1–0.9)
Monocytes: 4 %
Neutrophils Absolute: 6 10*3/uL (ref 1.4–7.0)
Neutrophils: 71 %
Platelets: 718 10*3/uL — ABNORMAL HIGH (ref 150–379)
RBC: 3.46 x10E6/uL — ABNORMAL LOW (ref 3.77–5.28)
RDW: 16.4 % — ABNORMAL HIGH (ref 12.3–15.4)
WBC: 8.5 10*3/uL (ref 3.4–10.8)

## 2017-10-28 LAB — CMP14+EGFR
ALT: 19 IU/L (ref 0–32)
AST: 18 IU/L (ref 0–40)
Albumin/Globulin Ratio: 1.3 (ref 1.2–2.2)
Albumin: 4 g/dL (ref 3.6–4.8)
Alkaline Phosphatase: 65 IU/L (ref 39–117)
BUN/Creatinine Ratio: 17 (ref 12–28)
BUN: 14 mg/dL (ref 8–27)
Bilirubin Total: <0.2 mg/dL (ref 0.0–1.2)
CO2: 23 mmol/L (ref 20–29)
Calcium: 9.2 mg/dL (ref 8.7–10.3)
Chloride: 94 mmol/L — ABNORMAL LOW (ref 96–106)
Creatinine, Ser: 0.81 mg/dL (ref 0.57–1.00)
GFR calc Af Amer: 91 mL/min/{1.73_m2} (ref >59)
GFR calc non Af Amer: 79 mL/min/{1.73_m2} (ref >59)
Globulin, Total: 3.2 g/dL (ref 1.5–4.5)
Glucose: 110 mg/dL — ABNORMAL HIGH (ref 65–99)
Potassium: 4.8 mmol/L (ref 3.5–5.2)
Sodium: 133 mmol/L — ABNORMAL LOW (ref 134–144)
Total Protein: 7.2 g/dL (ref 6.0–8.5)

## 2017-10-29 ENCOUNTER — Other Ambulatory Visit: Payer: Self-pay | Admitting: Physician Assistant

## 2017-10-29 DIAGNOSIS — R7989 Other specified abnormal findings of blood chemistry: Secondary | ICD-10-CM

## 2017-11-06 ENCOUNTER — Other Ambulatory Visit: Payer: Self-pay | Admitting: Physician Assistant

## 2017-11-08 ENCOUNTER — Encounter: Payer: Self-pay | Admitting: Physician Assistant

## 2017-11-09 ENCOUNTER — Encounter: Payer: Self-pay | Admitting: Physician Assistant

## 2017-11-16 ENCOUNTER — Other Ambulatory Visit: Payer: Self-pay | Admitting: Physician Assistant

## 2017-11-16 ENCOUNTER — Encounter: Payer: Self-pay | Admitting: Physician Assistant

## 2017-11-19 ENCOUNTER — Telehealth: Payer: Self-pay

## 2017-11-19 ENCOUNTER — Other Ambulatory Visit: Payer: Self-pay | Admitting: Physician Assistant

## 2017-11-19 DIAGNOSIS — F909 Attention-deficit hyperactivity disorder, unspecified type: Secondary | ICD-10-CM

## 2017-11-19 MED ORDER — AMPHETAMINE-DEXTROAMPHET ER 20 MG PO CP24: 20.0000 mg | ORAL_CAPSULE | ORAL | 0 refills | Status: DC

## 2017-11-19 MED ORDER — AMPHETAMINE-DEXTROAMPHET ER 10 MG PO CP24: 10.0000 mg | ORAL_CAPSULE | Freq: Every day | ORAL | 0 refills | Status: DC

## 2017-11-19 NOTE — Telephone Encounter (Signed)
I had left patient a message inquiring about medication.  She called back and the medication that needs PA is Adderall XR 1m (generic only). Number to patient's ins co is [phone]  I will call them shortly.

## 2017-11-19 NOTE — Telephone Encounter (Signed)
Received PA on the generic adderall.  Patient notified.  She was very Patent attorney. Claim # [id number] and approval dates 10/29/2017-11/19/2018 with a $15 co-pay.

## 2017-11-19 NOTE — Telephone Encounter (Signed)
LMVM for patient to CB to my desk with medication name and strength that needs PA.  Awaiting call to proceed.

## 2017-12-13 ENCOUNTER — Encounter: Payer: Self-pay | Admitting: Physician Assistant

## 2017-12-14 ENCOUNTER — Ambulatory Visit: Payer: BLUE CROSS/BLUE SHIELD | Admitting: Physician Assistant

## 2017-12-14 DIAGNOSIS — R6889 Other general symptoms and signs: Secondary | ICD-10-CM | POA: Diagnosis not present

## 2017-12-17 ENCOUNTER — Other Ambulatory Visit: Payer: Self-pay

## 2017-12-17 ENCOUNTER — Encounter: Payer: Self-pay | Admitting: Family Medicine

## 2017-12-17 ENCOUNTER — Ambulatory Visit (INDEPENDENT_AMBULATORY_CARE_PROVIDER_SITE_OTHER): Payer: BLUE CROSS/BLUE SHIELD | Admitting: Family Medicine

## 2017-12-17 ENCOUNTER — Encounter (HOSPITAL_COMMUNITY): Payer: Self-pay | Admitting: Family Medicine

## 2017-12-17 VITALS — BP 110/70 | HR 125 | Temp 99.5°F | Resp 16 | Ht 60.0 in | Wt 108.4 lb

## 2017-12-17 DIAGNOSIS — Z8249 Family history of ischemic heart disease and other diseases of the circulatory system: Secondary | ICD-10-CM

## 2017-12-17 DIAGNOSIS — R509 Fever, unspecified: Secondary | ICD-10-CM | POA: Diagnosis not present

## 2017-12-17 DIAGNOSIS — R000 Tachycardia, unspecified: Secondary | ICD-10-CM

## 2017-12-17 DIAGNOSIS — J45909 Unspecified asthma, uncomplicated: Secondary | ICD-10-CM | POA: Diagnosis not present

## 2017-12-17 DIAGNOSIS — R630 Anorexia: Secondary | ICD-10-CM | POA: Diagnosis not present

## 2017-12-17 DIAGNOSIS — E876 Hypokalemia: Secondary | ICD-10-CM | POA: Diagnosis present

## 2017-12-17 DIAGNOSIS — R1084 Generalized abdominal pain: Secondary | ICD-10-CM

## 2017-12-17 DIAGNOSIS — Z87891 Personal history of nicotine dependence: Secondary | ICD-10-CM | POA: Diagnosis not present

## 2017-12-17 DIAGNOSIS — E785 Hyperlipidemia, unspecified: Secondary | ICD-10-CM | POA: Diagnosis not present

## 2017-12-17 DIAGNOSIS — R109 Unspecified abdominal pain: Secondary | ICD-10-CM | POA: Diagnosis not present

## 2017-12-17 DIAGNOSIS — K529 Noninfective gastroenteritis and colitis, unspecified: Principal | ICD-10-CM | POA: Diagnosis present

## 2017-12-17 DIAGNOSIS — E860 Dehydration: Secondary | ICD-10-CM | POA: Diagnosis not present

## 2017-12-17 DIAGNOSIS — R197 Diarrhea, unspecified: Secondary | ICD-10-CM

## 2017-12-17 DIAGNOSIS — R5383 Other fatigue: Secondary | ICD-10-CM | POA: Diagnosis not present

## 2017-12-17 DIAGNOSIS — E871 Hypo-osmolality and hyponatremia: Secondary | ICD-10-CM | POA: Diagnosis not present

## 2017-12-17 DIAGNOSIS — D649 Anemia, unspecified: Secondary | ICD-10-CM | POA: Diagnosis present

## 2017-12-17 LAB — POC MICROSCOPIC URINALYSIS (UMFC): Mucus: ABSENT

## 2017-12-17 LAB — CBC
HEMATOCRIT: 36 % (ref 36.0–46.0)
HEMOGLOBIN: 12.6 g/dL (ref 12.0–15.0)
MCH: 32.6 pg (ref 26.0–34.0)
MCHC: 35 g/dL (ref 30.0–36.0)
MCV: 93 fL (ref 78.0–100.0)
Platelets: 587 10*3/uL — ABNORMAL HIGH (ref 150–400)
RBC: 3.87 MIL/uL (ref 3.87–5.11)
RDW: 14.3 % (ref 11.5–15.5)
WBC: 17.9 10*3/uL — ABNORMAL HIGH (ref 4.0–10.5)

## 2017-12-17 LAB — POCT URINALYSIS DIP (MANUAL ENTRY)
GLUCOSE UA: NEGATIVE mg/dL
Leukocytes, UA: NEGATIVE
Nitrite, UA: NEGATIVE
PH UA: 6 (ref 5.0–8.0)
Protein Ur, POC: 100 mg/dL — AB
SPEC GRAV UA: 1.02 (ref 1.010–1.025)
Urobilinogen, UA: 0.2 E.U./dL

## 2017-12-17 LAB — COMPREHENSIVE METABOLIC PANEL
ALBUMIN: 2.8 g/dL — AB (ref 3.5–5.0)
ALT: 12 U/L — ABNORMAL LOW (ref 14–54)
ANION GAP: 12 (ref 5–15)
AST: 14 U/L — AB (ref 15–41)
Alkaline Phosphatase: 63 U/L (ref 38–126)
BILIRUBIN TOTAL: 0.8 mg/dL (ref 0.3–1.2)
BUN: 8 mg/dL (ref 6–20)
CHLORIDE: 88 mmol/L — AB (ref 101–111)
CO2: 25 mmol/L (ref 22–32)
Calcium: 8.4 mg/dL — ABNORMAL LOW (ref 8.9–10.3)
Creatinine, Ser: 0.63 mg/dL (ref 0.44–1.00)
GFR calc Af Amer: >60 mL/min (ref >60)
GFR calc non Af Amer: >60 mL/min (ref >60)
GLUCOSE: 136 mg/dL — AB (ref 65–99)
POTASSIUM: 3.4 mmol/L — AB (ref 3.5–5.1)
SODIUM: 125 mmol/L — AB (ref 135–145)
Total Protein: 6.9 g/dL (ref 6.5–8.1)

## 2017-12-17 LAB — POCT CBC
GRANULOCYTE PERCENT: 63.2 % (ref 37–80)
HCT, POC: 35.5 % — AB (ref 37.7–47.9)
HEMOGLOBIN: 11.9 g/dL — AB (ref 12.2–16.2)
Lymph, poc: 4.6 — AB (ref 0.6–3.4)
MCH: 31.9 pg — AB (ref 27–31.2)
MCHC: 33.5 g/dL (ref 31.8–35.4)
MCV: 95.4 fL (ref 80–97)
MID (CBC): 0.9 (ref 0–0.9)
MPV: 7.2 fL (ref 0–99.8)
PLATELET COUNT, POC: 539 10*3/uL — AB (ref 142–424)
POC Granulocyte: 9.5 — AB (ref 2–6.9)
POC LYMPH PERCENT: 30.8 %L (ref 10–50)
POC MID %: 6 % (ref 0–12)
RBC: 3.73 M/uL — AB (ref 4.04–5.48)
RDW, POC: 15.1 %
WBC: 15 10*3/uL — AB (ref 4.6–10.2)

## 2017-12-17 LAB — LIPASE, BLOOD: LIPASE: 15 U/L (ref 11–51)

## 2017-12-17 NOTE — Progress Notes (Signed)
Subjective:    Patient ID: Anita Rice, female    DOB: 06/13/1956, 62 y.o.   MRN: [id number]  HPI Anita Rice is a 62 y.o. female Presents today for: Chief Complaint  Patient presents with  . Diarrhea    x 1 wk, per pt, "going about 24 times a day, watery poopy stool"  . horrible taste    in mouth at bedtime   Presents with diarrhea, started about 1 week ago, getting worse. . Initially with cramping about 12 episodes per day. Liquid stool, sometimes with small semi formed stools. Fever up to 102 last night. Initial fever 103.1 4 days ago. 24 diarrhea episodes per day at this time. Still fever. No known sick contacts. No recent antibiotics or hospitalizations. No recent foreign travel. Bad taste at bedtime past 3 nights, no n/v. Drinking fluids.  History of diarrhea in December, ultimately treated with prednisone that improved. GI profile negative at that time. Stool O and P negative and stool cx negative. persitent leukocytosis at that time (11-15), then normal at 8.5 on January 12th.  Had multiple treatments with IVF at that time.   JS:EGBTDVVO 1/2 cap twice per day. Has Lomotil, but not used during this flair.   History of Ulcerative proctitis since her 20's, but does not feel like flair as not bloody or mucus in stools. Last UC flair years ago.  GI: [name]. Has not discussed recent sx's with GI. Is under more stress recently.   Note reviewed from [facility] visit 3 days ago - note reviewed: HR 123, temp 102.2. Flu testing negative, but suspected flu. Started on Tamiflu twice per day. No nausea/vomiting.   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    Patient Active Problem List   Diagnosis Date Noted  . Macrocytosis without anemia 06/01/2014  . Thrombocytosis ([place]) 06/01/2014  . Alcoholic peripheral neuropathy ([place]) 06/01/2014   Past Medical History:  Diagnosis Date  . Allergy   . Anemia   . Anxiety   . Asthma   . Hyperlipidemia   . Macrocytosis without  anemia 06/01/2014  . Thrombocytosis ([place]) 06/01/2014  . Ulcerative colitis confined to rectum [facility])    Past Surgical History:  Procedure Laterality Date  . COLONOSCOPY W/ BIOPSIES    . COLONOSCOPY WITH PROPOFOL N/A 07/07/2014   Procedure: COLONOSCOPY WITH PROPOFOL;  Surgeon: Garlan Fair, MD;  Location: [facility] ENDOSCOPY;  Service: Endoscopy;  Laterality: N/A;  . NASAL SEPTUM SURGERY    . TONSILLECTOMY     Allergies  Allergen Reactions  . Adderall Xr [Amphetamine-Dextroamphet Er] Diarrhea  . Benadryl [Diphenhydramine] Itching  . Neosporin [Neomycin-Bacitracin Zn-Polymyx] Rash   Prior to Admission medications   Medication Sig Start Date End Date Taking? Authorizing Provider  acetaminophen (TYLENOL) 500 MG tablet Take 500 mg by mouth every 6 (six) hours as needed.   Yes [provider]  albuterol (PROVENTIL HFA;VENTOLIN HFA) 108 (90 Base) MCG/ACT inhaler Inhale 2 puffs into the lungs every 6 (six) hours as needed for wheezing or shortness of breath. 11/27/16  Yes McVey, Gelene Mink, PA-C  amphetamine-dextroamphetamine (ADDERALL XR) 30 MG 24 hr capsule Take 1 capsule (30 mg total) by mouth every morning. 12/15/17 01/14/18 Yes McVey, Gelene Mink, PA-C  amphetamine-dextroamphetamine (ADDERALL XR) 30 MG 24 hr capsule Take 1 capsule (30 mg total) by mouth every morning. 11/17/17 12/17/17 Yes McVey, Gelene Mink, PA-C  amphetamine-dextroamphetamine (ADDERALL) 10 MG tablet Take 1 tablet (10 mg total) by mouth daily. You  may take this in addition to your Adderall 76m PRN 12/15/17 01/14/18 Yes McVey, EGelene Mink PA-C  amphetamine-dextroamphetamine (ADDERALL) 10 MG tablet Take 1 tablet (10 mg total) by mouth daily. You may take this in addition to Adderall 394mPRN 11/17/17 12/17/17 Yes McVey, ElGelene MinkPA-C  budesonide-formoterol ([facility]160-4.5 MCG/ACT inhaler Inhale 2 puffs into the lungs every morning. 11/27/16  Yes McVey, ElGelene MinkPA-C  conjugated estrogens  (PREMARIN) vaginal cream Place 1 Applicatorful vaginally daily. 04/10/17  Yes McVey, ElGelene MinkPA-C  Ferrous Sulfate (IRON) 28 MG TABS Take 1 tablet by mouth every morning.    Yes [provider]  fexofenadine (ALLEGRA) 180 MG tablet TAKE 1 TABLET BY MOUTH EVERY DAY 11/16/17  Yes McVey, ElGelene MinkPA-C  fluocinolone (VANOS) 0.01 % cream Apply topically 2 (two) times daily. 04/10/17  Yes McVey, ElGelene MinkPA-C  fluticasone ([facility]50 MCG/ACT nasal spray USE 2 SPRAYS IN THE NOSE ONCE DAILY AS DIRECTED 04/28/17  Yes McVey, ElGelene MinkPA-C  ibuprofen (ADVIL,MOTRIN) 200 MG tablet Take 400 mg by mouth every 6 (six) hours as needed for headache or mild pain.    Yes [provider]  LORazepam (ATIVAN) 0.5 MG tablet Take 1 tablet (0.5 mg total) by mouth 2 (two) times daily as needed for anxiety. 12/15/17 01/14/18 Yes McVey, ElGelene MinkPA-C  LORazepam (ATIVAN) 0.5 MG tablet Take 1 tablet (0.5 mg total) by mouth 2 (two) times daily as needed for anxiety. 11/17/17 12/17/17 Yes McVey, ElGelene MinkPA-C  Multiple Vitamins-Minerals (CENTRUM SILVER PO) Take 1 tablet by mouth every morning.    Yes [provider]  oseltamivir (TAMIFLU) 75 MG capsule Take 75 mg by mouth.   Yes [provider]  YUVAFEM 10 MCG TABS vaginal tablet PLACE 1 TABLET (10 MCG TOTAL) VAGINALLY 2 (TWO) TIMES A WEEK. 10/04/17  Yes McVey, ElGelene MinkPA-C  amphetamine-dextroamphetamine (ADDERALL XR) 10 MG 24 hr capsule Take 1 capsule (10 mg total) by mouth daily. Take with 2076mor a total of 4m35m. 11/19/17   McVey, ElizGelene Mink-C  amphetamine-dextroamphetamine (ADDERALL XR) 20 MG 24 hr capsule Take 1 capsule (20 mg total) by mouth every morning. Take with 10mg79m a total of 4mg 54m2/4/19   McVey, ElizabGelene Mink  amphetamine-dextroamphetamine (ADDERALL XR) 30 MG 24 hr capsule Take 1 capsule (30 mg total) by mouth every morning. 10/17/17 11/16/17  McVey,  ElizabGelene Mink  amphetamine-dextroamphetamine (ADDERALL) 10 MG tablet Take 1 tablet (10 mg total) by mouth daily. You may take in addition to Adderall 4mg P19m/2/19 11/16/17  McVey, ElizabeGelene Mink atomoxetine (STRATTERA) 40 MG capsule Take 1 capsule (40 mg total) by mouth daily. May increase dose after 3 days. Patient not taking: Reported on 12/17/2017 04/28/17   McVey, ElizabeGelene Mink diphenoxylate-atropine (LOMOTIL) 2.5-0.025 MG tablet Take 2 tablets by mouth 4 (four) times daily as needed for diarrhea or loose stools. Patient not taking: Reported on 12/17/2017 10/08/17   McVey, ElizabeGelene Mink lisdexamfetamine (VYVANSE) 10 MG capsule Take 1 capsule (10 mg total) by mouth daily. May take with 4mg fo63mtotal of 40mg qd.58m5/18 08/19/17  McVey, ElizabethGelene Minkisdexamfetamine (VYVANSE) 10 MG capsule Take 1 capsule (10 mg total) by mouth daily. May take with 4mg for 68mtal of 40mg if ne50m. 08/20/17 09/19/17  McVey, Elizabeth WGelene Minkdexamfetamine (VYVANSE) 10 MG capsule Take 1 capsule (10 mg total) by mouth daily. May  take with 44m daily for total of 473m12/5/18 10/19/17  McVey, ElGelene MinkPA-C  lisdexamfetamine (VYVANSE) 30 MG capsule Take 1 capsule (30 mg total) by mouth every morning. May increase in increments of 10 mg at weekly intervals. 07/20/17 08/19/17  McVey, ElGelene MinkPA-C  lisdexamfetamine (VYVANSE) 30 MG capsule Take 1 capsule (30 mg total) by mouth daily. May increase in increments of 10 mg at weekly intervals. 08/20/17 09/19/17  McVey, ElGelene MinkPA-C  lisdexamfetamine (VYVANSE) 30 MG capsule Take 1 capsule (30 mg total) by mouth daily. May increase in increments of 10 mg at weekly intervals. 09/19/17 10/19/17  McVey, ElGelene MinkPA-C   Social History   Socioeconomic History  . Marital status: Married    Spouse name: Not on file  . Number of children: 2  . Years of education: Not on file  . Highest  education level: Not on file  Social Needs  . Financial resource strain: Not on file  . Food insecurity - worry: Not on file  . Food insecurity - inability: Not on file  . Transportation needs - medical: Not on file  . 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 Smoking status: Former SmResearch scientist (life sciences). Smokeless tobacco: Never Used  Substance and Sexual Activity  . Alcohol use: Yes    Comment: Occasional  . Drug use: No  . Sexual activity: Not on file  Other Topics Concern  . Not on file  Social History Narrative  . Not on file    Review of Systems  Constitutional: Positive for fever.  Gastrointestinal: Positive for abdominal pain and diarrhea. Negative for blood in stool, nausea and vomiting.  Genitourinary: Negative for difficulty urinating.       Objective:   Physical Exam  Constitutional: She is oriented to person, place, and time. She appears well-developed and well-nourished.  HENT:  Head: Normocephalic and atraumatic.  Eyes: Conjunctivae and EOM are normal. Pupils are equal, round, and reactive to light.  Neck: Carotid bruit is not present.  Cardiovascular: Normal rate, regular rhythm, normal heart sounds and intact distal pulses.  Pulmonary/Chest: Effort normal and breath sounds normal.  Abdominal: Soft. She exhibits no distension and no pulsatile midline mass. There is tenderness (epigastric, diffuse lower/suprapubic, LLQ. no rebound/guarding. ).  Neurological: She is alert and oriented to person, place, and time.  Skin: Skin is warm and dry.  Psychiatric: She has a normal mood and affect. Her behavior is normal.  Vitals reviewed.  Vitals:   12/17/17 1430 12/17/17 1433  BP: 110/70   Pulse: (!) 134 (!) 125  Resp: 16   Temp: 99.5 F (37.5 C)   TempSrc: Oral   SpO2: 99%   Weight: 108 lb 6.4 oz (49.2 kg)   Height: 5' (1.524 m)     Orthostatic VS for the past 24  hrs (Last 3 readings):  BP- Lying Pulse- Lying BP- Sitting Pulse- Sitting BP- Standing at 0 minutes Pulse- Standing at 0 minutes  12/17/17 1527 105/71 113 106/71 114 101/69 118   Results for orders placed or performed in visit on 12/17/17  POCT CBC  Result Value Ref Range   WBC 15.0 (A) 4.6 - 10.2 K/uL   Lymph, poc 4.6 (A) 0.6 - 3.4   POC LYMPH PERCENT 30.8 10 - 50 %L   MID (cbc) 0.9 0 - 0.9   POC MID % 6.0  0 - 12 %M   POC Granulocyte 9.5 (A) 2 - 6.9   Granulocyte percent 63.2 37 - 80 %G   RBC 3.73 (A) 4.04 - 5.48 M/uL   Hemoglobin 11.9 (A) 12.2 - 16.2 g/dL   HCT, POC 35.5 (A) 37.7 - 47.9 %   MCV 95.4 80 - 97 fL   MCH, POC 31.9 (A) 27 - 31.2 pg   MCHC 33.5 31.8 - 35.4 g/dL   RDW, POC 15.1 %   Platelet Count, POC 539 (A) 142 - 424 K/uL   MPV 7.2 0 - 99.8 fL  POCT urinalysis dipstick  Result Value Ref Range   Color, UA yellow yellow   Clarity, UA clear clear   Glucose, UA negative negative mg/dL   Bilirubin, UA small (A) negative   Ketones, POC UA moderate (40) (A) negative mg/dL   Spec Grav, UA 1.020 1.010 - 1.025   Blood, UA trace-lysed (A) negative   pH, UA 6.0 5.0 - 8.0   Protein Ur, POC =100 (A) negative mg/dL   Urobilinogen, UA 0.2 0.2 or 1.0 E.U./dL   Nitrite, UA Negative Negative   Leukocytes, UA Negative Negative  POCT Microscopic Urinalysis ([facility])  Result Value Ref Range   WBC,UR,HPF,POC None None WBC/hpf   RBC,UR,HPF,POC Few (A) None RBC/hpf   Bacteria Few (A) None, Too numerous to count   Mucus Absent Absent   Epithelial Cells, UR Per Microscopy Few (A) None, Too numerous to count cells/hpf      Assessment & Plan:  DERIONA ALTEMOSE is a 62 y.o. female Fever, unspecified - Plan: POCT CBC, POCT urinalysis dipstick, POCT Microscopic Urinalysis ([facility])  Diarrhea, unspecified type - Plan: Orthostatic vital signs  Abdominal pain, generalized - Plan: POCT urinalysis dipstick, POCT Microscopic Urinalysis ([facility])  Tachycardia - Plan: POCT CBC, Orthostatic vital  signs  1 week history of diarrhea, initially 12 episodes per day, now up to 24 episodes per day. Accompanied by onset of fever approximately 4 days ago, up to 103. She is tachycardic, with most recent fever this morning.  Leukocytosis, diffuse abdominal pain, primarily epigastric, left upper left lower quadrant. Reported history of ulcerative proctitis, but denies bloody stools or mucus in the stools that she states is her usual symptoms. Of note did have diarrhea, leukocytosis, initial fever in December that ultimately improved with prednisone. No known Clostridium difficile risk factors. Flu testing negative at outside facility last week, but fevers had started after diarrhea had been present for a few days. Less likely influenza  -Based on worsening symptoms, fever, tachycardia, concerning for intestinal infection/intra-abdominal process we'll have evaluated further through emergency room for possible IV fluids, imaging. Will advise triage nurse at [facility].   No orders of the defined types were placed in this encounter.  Patient Instructions   Based on your exam, blood work, fever, and worsening symptoms I am concerned about a possible intestinal infection. Please proceed to [facility] emergency room for further evaluation, possible IV fluids, and possible advanced imaging with CT scan.   If they treat you in the emergency room and you are discharged with follow-up needed, I am happy to see you in the next few days.   IF you received an x-ray today, you will receive an invoice from [facility] Radiology. Please contact [facility] Radiology at [phone] with questions or concerns regarding your invoice.   IF you received labwork today, you will receive an invoice from [facility]. Please contact [facility] at [phone] with questions or concerns  regarding your invoice.   Our billing staff will not be able to assist you with questions regarding bills from these companies.  You will be  contacted with the lab results as soon as they are available. The fastest way to get your results is to activate your My Chart account. Instructions are located on the last page of this paperwork. If you have not heard from [place] regarding the results in 2 weeks, please contact this office.       Signed,   Merri Ray, MD Primary Care at [place].  12/17/17 4:26 PM

## 2017-12-17 NOTE — ED Notes (Signed)
Gave Pt a urine cup in lobby with Pt sticker. Pt is aware a urine sample is needed, but is unable to provide one at this time.

## 2017-12-17 NOTE — ED Notes (Signed)
Coming from [facility] abdominal pain, elevated WBC, diarrhea-states history of ulcerative proctitis

## 2017-12-17 NOTE — ED Triage Notes (Signed)
Patient was seen by Dr. Jacklyn Shell today for one week of watery diarrhea and a horrible taste in her mouth at bedtime for the last 3 nights. Dr. Carlota Raspberry recommended she be followed for possible intestine infection, IV fluids, and abdomen CT. Patient reports she has lower abd cramping. Denies nausea, vomiting, but reports fever.

## 2017-12-17 NOTE — Patient Instructions (Signed)
Based on your exam, blood work, fever, and worsening symptoms I am concerned about a possible intestinal infection. Please proceed to [facility] emergency room for further evaluation, possible IV fluids, and possible advanced imaging with CT scan.   If they treat you in the emergency room and you are discharged with follow-up needed, I am happy to see you in the next few days.   IF you received an x-ray today, you will receive an invoice from [facility] Rad[facility]. Please contact [facility] Radiology at [phone] with questions or concerns regarding your invoice.   IF you received labwork today, you will receive an invoice from [place]. Please contact [facility] at [phone] with questions or concerns regarding your invoice.   Our billing staff will not be able to assist you with questions regarding bills from these companies.  You will be contacted with the lab results as soon as they are available. The fastest way to get your results is to activate your My Chart account. Instructions are located on the last page of this paperwork. If you have not heard from [place] regarding the results in 2 weeks, please contact this office.

## 2017-12-18 ENCOUNTER — Encounter (HOSPITAL_COMMUNITY): Payer: Self-pay

## 2017-12-18 ENCOUNTER — Other Ambulatory Visit: Payer: Self-pay | Admitting: Physician Assistant

## 2017-12-18 ENCOUNTER — Inpatient Hospital Stay (HOSPITAL_COMMUNITY)
Admission: EM | Admit: 2017-12-18 | Discharge: 2017-12-20 | DRG: 392 | Disposition: A | Discharge status: Home / Self Care | Payer: BLUE CROSS/BLUE SHIELD | Attending: Internal Medicine | Admitting: Internal Medicine

## 2017-12-18 ENCOUNTER — Other Ambulatory Visit: Payer: Self-pay

## 2017-12-18 ENCOUNTER — Emergency Department (HOSPITAL_COMMUNITY): Payer: BLUE CROSS/BLUE SHIELD

## 2017-12-18 DIAGNOSIS — E785 Hyperlipidemia, unspecified: Secondary | ICD-10-CM | POA: Diagnosis present

## 2017-12-18 DIAGNOSIS — K51219 Ulcerative (chronic) proctitis with unspecified complications: Secondary | ICD-10-CM

## 2017-12-18 DIAGNOSIS — D649 Anemia, unspecified: Secondary | ICD-10-CM | POA: Diagnosis present

## 2017-12-18 DIAGNOSIS — R109 Unspecified abdominal pain: Secondary | ICD-10-CM | POA: Diagnosis not present

## 2017-12-18 DIAGNOSIS — K529 Noninfective gastroenteritis and colitis, unspecified: Secondary | ICD-10-CM | POA: Diagnosis present

## 2017-12-18 DIAGNOSIS — Z87891 Personal history of nicotine dependence: Secondary | ICD-10-CM | POA: Diagnosis not present

## 2017-12-18 DIAGNOSIS — R197 Diarrhea, unspecified: Secondary | ICD-10-CM | POA: Diagnosis present

## 2017-12-18 DIAGNOSIS — E871 Hypo-osmolality and hyponatremia: Secondary | ICD-10-CM | POA: Diagnosis not present

## 2017-12-18 DIAGNOSIS — E876 Hypokalemia: Secondary | ICD-10-CM | POA: Diagnosis not present

## 2017-12-18 DIAGNOSIS — J45909 Unspecified asthma, uncomplicated: Secondary | ICD-10-CM | POA: Diagnosis present

## 2017-12-18 DIAGNOSIS — Z8249 Family history of ischemic heart disease and other diseases of the circulatory system: Secondary | ICD-10-CM | POA: Diagnosis not present

## 2017-12-18 DIAGNOSIS — R6510 Systemic inflammatory response syndrome (SIRS) of non-infectious origin without acute organ dysfunction: Secondary | ICD-10-CM | POA: Diagnosis present

## 2017-12-18 DIAGNOSIS — E860 Dehydration: Secondary | ICD-10-CM | POA: Diagnosis not present

## 2017-12-18 DIAGNOSIS — K5120 Ulcerative (chronic) proctitis without complications: Secondary | ICD-10-CM | POA: Diagnosis present

## 2017-12-18 LAB — URINALYSIS, ROUTINE W REFLEX MICROSCOPIC
Bacteria, UA: NONE SEEN
Bilirubin Urine: NEGATIVE
GLUCOSE, UA: NEGATIVE mg/dL
KETONES UR: 20 mg/dL — AB
Nitrite: NEGATIVE
Protein, ur: 30 mg/dL — AB
Specific Gravity, Urine: 1.03 (ref 1.005–1.030)
pH: 5 (ref 5.0–8.0)

## 2017-12-18 LAB — I-STAT CG4 LACTIC ACID, ED: LACTIC ACID, VENOUS: 1.07 mmol/L (ref 0.5–1.9)

## 2017-12-18 LAB — C-REACTIVE PROTEIN: CRP: 24.2 mg/dL — AB (ref <1.0)

## 2017-12-18 LAB — SEDIMENTATION RATE: Sed Rate: 52 mm/hr — ABNORMAL HIGH (ref 0–22)

## 2017-12-18 MED ORDER — IOPAMIDOL (ISOVUE-300) INJECTION 61%
INTRAVENOUS | Status: AC
  Filled 2017-12-18: qty 100

## 2017-12-18 MED ORDER — AMPHETAMINE-DEXTROAMPHET ER 10 MG PO CP24
30.0000 mg | ORAL_CAPSULE | Freq: Every day | ORAL | Status: DC
  Filled 2017-12-18 (×2): qty 3

## 2017-12-18 MED ORDER — ACETAMINOPHEN 325 MG PO TABS
650.0000 mg | ORAL_TABLET | Freq: Four times a day (QID) | ORAL | Status: DC | PRN
  Administered 2017-12-19: 650 mg via ORAL
  Filled 2017-12-18: qty 2

## 2017-12-18 MED ORDER — LORAZEPAM 0.5 MG PO TABS
0.5000 mg | ORAL_TABLET | Freq: Two times a day (BID) | ORAL | Status: DC | PRN
  Administered 2017-12-18: 0.5 mg via ORAL
  Filled 2017-12-18: qty 1

## 2017-12-18 MED ORDER — ESTRADIOL 10 MCG VA TABS: 10.0000 ug | ORAL_TABLET | VAGINAL | Status: DC

## 2017-12-18 MED ORDER — AMPHETAMINE-DEXTROAMPHET ER 5 MG PO CP24: 30.0000 mg | ORAL_CAPSULE | ORAL | Status: DC

## 2017-12-18 MED ORDER — ONDANSETRON HCL 4 MG PO TABS: 4.0000 mg | ORAL_TABLET | Freq: Four times a day (QID) | ORAL | Status: DC | PRN

## 2017-12-18 MED ORDER — POTASSIUM CHLORIDE IN NACL 20-0.9 MEQ/L-% IV SOLN
INTRAVENOUS | Status: AC
  Administered 2017-12-18: 17:00:00 via INTRAVENOUS
  Filled 2017-12-18 (×2): qty 1000

## 2017-12-18 MED ORDER — SODIUM CHLORIDE 0.9 % IV BOLUS (SEPSIS)
1000.0000 mL | Freq: Once | INTRAVENOUS | Status: AC
  Administered 2017-12-18: 1000 mL via INTRAVENOUS

## 2017-12-18 MED ORDER — ALBUTEROL SULFATE HFA 108 (90 BASE) MCG/ACT IN AERS: 2.0000 | INHALATION_SPRAY | Freq: Four times a day (QID) | RESPIRATORY_TRACT | Status: DC | PRN

## 2017-12-18 MED ORDER — METRONIDAZOLE IN NACL 5-0.79 MG/ML-% IV SOLN
500.0000 mg | Freq: Three times a day (TID) | INTRAVENOUS | Status: DC
  Administered 2017-12-18 – 2017-12-20 (×5): 500 mg via INTRAVENOUS
  Filled 2017-12-18 (×5): qty 100

## 2017-12-18 MED ORDER — IOPAMIDOL (ISOVUE-300) INJECTION 61%
100.0000 mL | Freq: Once | INTRAVENOUS | Status: AC | PRN
  Administered 2017-12-18: 80 mL via INTRAVENOUS

## 2017-12-18 MED ORDER — ONDANSETRON HCL 4 MG/2ML IJ SOLN: 4.0000 mg | Freq: Four times a day (QID) | INTRAMUSCULAR | Status: DC | PRN

## 2017-12-18 MED ORDER — IBUPROFEN 200 MG PO TABS: 400.0000 mg | ORAL_TABLET | Freq: Four times a day (QID) | ORAL | Status: DC | PRN

## 2017-12-18 MED ORDER — IOPAMIDOL (ISOVUE-300) INJECTION 61%
INTRAVENOUS | Status: AC
  Administered 2017-12-18: 30 mL via ORAL
  Filled 2017-12-18: qty 30

## 2017-12-18 MED ORDER — IRON 28 MG PO TABS: 1.0000 | ORAL_TABLET | Freq: Every morning | ORAL | Status: DC

## 2017-12-18 MED ORDER — ALBUTEROL SULFATE (2.5 MG/3ML) 0.083% IN NEBU: 2.5000 mg | INHALATION_SOLUTION | Freq: Four times a day (QID) | RESPIRATORY_TRACT | Status: DC | PRN

## 2017-12-18 MED ORDER — ESTROGENS, CONJUGATED 0.625 MG/GM VA CREA
1.0000 | TOPICAL_CREAM | Freq: Every day | VAGINAL | Status: DC
Start: 2017-12-18 — End: 2017-12-20
  Filled 2017-12-18: qty 30

## 2017-12-18 MED ORDER — CIPROFLOXACIN IN D5W 400 MG/200ML IV SOLN
400.0000 mg | Freq: Once | INTRAVENOUS | Status: DC
  Filled 2017-12-18: qty 200

## 2017-12-18 MED ORDER — SODIUM CHLORIDE 0.9 % IJ SOLN
INTRAMUSCULAR | Status: AC
  Filled 2017-12-18: qty 50

## 2017-12-18 MED ORDER — CIPROFLOXACIN IN D5W 400 MG/200ML IV SOLN
400.0000 mg | Freq: Two times a day (BID) | INTRAVENOUS | Status: DC
  Administered 2017-12-18 – 2017-12-19 (×4): 400 mg via INTRAVENOUS
  Filled 2017-12-18 (×3): qty 200

## 2017-12-18 MED ORDER — METRONIDAZOLE IN NACL 5-0.79 MG/ML-% IV SOLN
500.0000 mg | Freq: Once | INTRAVENOUS | Status: AC
  Administered 2017-12-18: 500 mg via INTRAVENOUS
  Filled 2017-12-18: qty 100

## 2017-12-18 MED ORDER — FERROUS SULFATE 325 (65 FE) MG PO TABS
325.0000 mg | ORAL_TABLET | Freq: Every day | ORAL | Status: DC
  Administered 2017-12-19: 325 mg via ORAL
  Filled 2017-12-18 (×3): qty 1

## 2017-12-18 MED ORDER — ACETAMINOPHEN 650 MG RE SUPP: 650.0000 mg | Freq: Four times a day (QID) | RECTAL | Status: DC | PRN

## 2017-12-18 MED ORDER — ACETAMINOPHEN 500 MG PO TABS: 500.0000 mg | ORAL_TABLET | Freq: Four times a day (QID) | ORAL | Status: DC | PRN

## 2017-12-18 MED ORDER — AMPHETAMINE-DEXTROAMPHETAMINE 10 MG PO TABS
10.0000 mg | ORAL_TABLET | Freq: Every day | ORAL | Status: DC | PRN
  Filled 2017-12-18: qty 1

## 2017-12-18 MED ORDER — AMPHETAMINE-DEXTROAMPHETAMINE 10 MG PO TABS: 10.0000 mg | ORAL_TABLET | Freq: Every day | ORAL | Status: DC

## 2017-12-18 MED ORDER — METHYLPREDNISOLONE SODIUM SUCC 125 MG IJ SOLR
80.0000 mg | Freq: Once | INTRAMUSCULAR | Status: AC
  Administered 2017-12-18: 80 mg via INTRAVENOUS
  Filled 2017-12-18: qty 2

## 2017-12-18 MED ORDER — IOPAMIDOL (ISOVUE-300) INJECTION 61%
30.0000 mL | Freq: Once | INTRAVENOUS | Status: AC | PRN
  Administered 2017-12-18: 30 mL via ORAL

## 2017-12-18 NOTE — ED Notes (Signed)
Patient ambulatory to restroom without assistance.

## 2017-12-18 NOTE — H&P (Addendum)
History and Physical    Anita Rice HUD:[id number] DOB: 05-03-1956 DOA: 12/18/2017  PCP: [name], PA-C  Patient coming from: Home  Chief Complaint: Diarrhea  HPI: Anita Rice is a 62 y.o. female with medical history significant of ulcerative proctitis, asthma comes in with over a week of over 10 episodes of diarrhea a day.  She reports the diarrhea is watery and not her normal mucousy diarrhea when she has a flare.  She is only had involvement of her proctitis in the past.  She is not on prednisone frequently for flares.  She is not seen GI doctor in 5 years.  She has been having fevers and she denies any vomiting or significant abdominal pain.  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  She denies any blood in her diarrhea.  Patient is being referred for acute colitis of unclear etiology.  Review of Systems: As per HPI otherwise 10 point review of systems negative.   Past Medical History:  Diagnosis Date  . Allergy   . Anemia   . Anxiety   . Asthma   . Hyperlipidemia   . Macrocytosis without anemia 06/01/2014  . Thrombocytosis ([place]) 06/01/2014  . Ulcerative colitis confined to rectum [facility])     Past Surgical History:  Procedure Laterality Date  . COLONOSCOPY W/ BIOPSIES    . COLONOSCOPY WITH PROPOFOL N/A 07/07/2014   Procedure: COLONOSCOPY WITH PROPOFOL;  Surgeon: Garlan Fair, MD;  Location: [facility];  Service: Endoscopy;  Laterality: N/A;  . NASAL SEPTUM SURGERY    . TONSILLECTOMY       reports that she has quit smoking. she has never used smokeless tobacco. She reports that she drinks alcohol. She reports that she does not use drugs.  Allergies  Allergen Reactions  . Adderall Xr [Amphetamine-Dextroamphet Er] Diarrhea  . Benadryl [Diphenhydramine] Itching  . Neosporin [Neomycin-Bacitracin Zn-Polymyx] Rash    Family History  Problem Relation Age of Onset  . Cancer Paternal Grandfather        lung ca    . CAD Father        Cabg at age 27  . CAD Mother        MI at age 59  . Stroke Maternal Grandfather   . Cancer Maternal Grandfather   . Heart disease Paternal Grandmother     Prior to Admission medications   Medication Sig Start Date End Date Taking? Authorizing Provider  acetaminophen (TYLENOL) 500 MG tablet Take 500 mg by mouth every 6 (six) hours as needed for mild pain.    Yes [provider]  albuterol (PROVENTIL HFA;VENTOLIN HFA) 108 (90 Base) MCG/ACT inhaler Inhale 2 puffs into the lungs every 6 (six) hours as needed for wheezing or shortness of breath. 11/27/16  Yes McVey, Gelene Mink, PA-C  amphetamine-dextroamphetamine (ADDERALL XR) 30 MG 24 hr capsule Take 1 capsule (30 mg total) by mouth every morning. 12/15/17 01/14/18 Yes McVey, Gelene Mink, PA-C  budesonide-formoterol (SYMBICORT) 160-4.5 MCG/ACT inhaler Inhale 2 puffs into the lungs every morning. 11/27/16  Yes McVey, Gelene Mink, PA-C  conjugated estrogens (PREMARIN) vaginal cream Place 1 Applicatorful vaginally daily. 04/10/17  Yes McVey, Gelene Mink, PA-C  Ferrous Sulfate (IRON) 28 MG TABS Take 1 tablet by mouth every morning.    Yes [provider]  fexofenadine (ALLEGRA) 180 MG tablet TAKE 1 TABLET BY MOUTH EVERY DAY 11/16/17  Yes McVey, Gelene Mink, PA-C  fluocinolone (  VANOS) 0.01 % cream Apply topically 2 (two) times daily. 04/10/17  Yes McVey, Gelene Mink, PA-C  fluticasone [facility]) 50 MCG/ACT nasal spray USE 2 SPRAYS IN THE NOSE ONCE DAILY AS DIRECTED 04/28/17  Yes McVey, Gelene Mink, PA-C  ibuprofen (ADVIL,MOTRIN) 200 MG tablet Take 400 mg by mouth every 6 (six) hours as needed for headache or mild pain.    Yes [provider]  LORazepam (ATIVAN) 0.5 MG tablet Take 1 tablet (0.5 mg total) by mouth 2 (two) times daily as needed for anxiety. 12/15/17 01/14/18 Yes McVey, Gelene Mink, PA-C  Multiple Vitamins-Minerals (CENTRUM SILVER PO) Take 1 tablet by mouth  every morning.    Yes [provider]  oseltamivir (TAMIFLU) 75 MG capsule Take 75 mg by mouth 2 (two) times daily.    Yes [provider]  YUVAFEM 10 MCG TABS vaginal tablet PLACE 1 TABLET (10 MCG TOTAL) VAGINALLY 2 (TWO) TIMES A WEEK. 10/04/17  Yes McVey, Gelene Mink, PA-C  amphetamine-dextroamphetamine (ADDERALL XR) 10 MG 24 hr capsule Take 1 capsule (10 mg total) by mouth daily. Take with 46m for a total of 352mqd. Patient not taking: Reported on 12/18/2017 11/19/17   McVey, ElGelene MinkPA-C  amphetamine-dextroamphetamine (ADDERALL XR) 20 MG 24 hr capsule Take 1 capsule (20 mg total) by mouth every morning. Take with 1072mor a total of 16m51m. Patient not taking: Reported on 12/18/2017 11/19/17   McVey, ElizGelene Mink-C  amphetamine-dextroamphetamine (ADDERALL XR) 30 MG 24 hr capsule Take 1 capsule (30 mg total) by mouth every morning. 11/17/17 12/17/17  McVey, ElizGelene Mink-C  amphetamine-dextroamphetamine (ADDERALL XR) 30 MG 24 hr capsule Take 1 capsule (30 mg total) by mouth every morning. 10/17/17 11/16/17  McVey, ElizGelene Mink-C  amphetamine-dextroamphetamine (ADDERALL) 10 MG tablet Take 1 tablet (10 mg total) by mouth daily. You may take this in addition to your Adderall 16mg42m Patient not taking: Reported on 12/18/2017 12/15/17 01/14/18  McVey, ElizaGelene MinkC  amphetamine-dextroamphetamine (ADDERALL) 10 MG tablet Take 1 tablet (10 mg total) by mouth daily. You may take this in addition to Adderall 16mg 44m2/2/19 12/17/17  McVey, ElizabGelene Mink  amphetamine-dextroamphetamine (ADDERALL) 10 MG tablet Take 1 tablet (10 mg total) by mouth daily. You may take in addition to Adderall 16mg P29m/2/19 11/16/17  McVey, ElizabeGelene Mink atomoxetine (STRATTERA) 40 MG capsule Take 1 capsule (40 mg total) by mouth daily. May increase dose after 3 days. Patient not taking: Reported on 12/17/2017 04/28/17   McVey, ElizabeGelene Mink   diphenoxylate-atropine (LOMOTIL) 2.5-0.025 MG tablet Take 2 tablets by mouth 4 (four) times daily as needed for diarrhea or loose stools. Patient not taking: Reported on 12/17/2017 10/08/17   McVey, ElizabeGelene Mink lisdexamfetamine (VYVANSE) 10 MG capsule Take 1 capsule (10 mg total) by mouth daily. May take with 16mg fo61mtotal of 40mg qd.40mient not taking: Reported on 12/18/2017 07/20/17 08/19/17  McVey, ElizabethGelene Minkisdexamfetamine (VYVANSE) 10 MG capsule Take 1 capsule (10 mg total) by mouth daily. May take with 16mg for 17mtal of 40mg if ne23m. Patient not taking: Reported on 12/18/2017 08/20/17 09/19/17  McVey, Elizabeth WGelene Minkdexamfetamine (VYVANSE) 10 MG capsule Take 1 capsule (10 mg total) by mouth daily. May take with 16mg daily 43mtotal of 40mg Patient39m taking: Reported on 12/18/2017 09/19/17 10/19/17  McVey, Elizabeth WhiGelene Minkxamfetamine (VYVANSE) 30 MG capsule Take 1 capsule (30 mg total)  by mouth every morning. May increase in increments of 10 mg at weekly intervals. Patient not taking: Reported on 12/18/2017 07/20/17 08/19/17  McVey, Gelene Mink, PA-C  lisdexamfetamine (VYVANSE) 30 MG capsule Take 1 capsule (30 mg total) by mouth daily. May increase in increments of 10 mg at weekly intervals. Patient not taking: Reported on 12/18/2017 08/20/17 09/19/17  McVey, Gelene Mink, PA-C  lisdexamfetamine (VYVANSE) 30 MG capsule Take 1 capsule (30 mg total) by mouth daily. May increase in increments of 10 mg at weekly intervals. Patient not taking: Reported on 12/18/2017 09/19/17 10/19/17  [name], [place]    Physical Exam: Vitals:   12/17/17 2309 12/18/17 0231 12/18/17 0441 12/18/17 0811  BP: (!) 119/93 133/76 127/82 140/79  Pulse: (!) 117 (!) 107 100 (!) 112  Resp: 16 18 18 18   Temp: 99.5 F (37.5 C)     TempSrc: Oral     SpO2: 94% 97% 99% 98%  Weight:      Height:          Constitutional: NAD, calm, comfortable Vitals:    12/17/17 2309 12/18/17 0231 12/18/17 0441 12/18/17 0811  BP: (!) 119/93 133/76 127/82 140/79  Pulse: (!) 117 (!) 107 100 (!) 112  Resp: 16 18 18 18   Temp: 99.5 F (37.5 C)     TempSrc: Oral     SpO2: 94% 97% 99% 98%  Weight:      Height:       Eyes: PERRL, lids and conjunctivae normal ENMT: Mucous membranes are moist. Posterior pharynx clear of any exudate or lesions.Normal dentition.  Neck: normal, supple, no masses, no thyromegaly Respiratory: clear to auscultation bilaterally, no wheezing, no crackles. Normal respiratory effort. No accessory muscle use.  Cardiovascular: Regular rate and rhythm, no murmurs / rubs / gallops. No extremity edema. 2+ pedal pulses. No carotid bruits.  Abdomen: no tenderness, no masses palpated. No hepatosplenomegaly. Bowel sounds positive.  Musculoskeletal: no clubbing / cyanosis. No joint deformity upper and lower extremities. Good ROM, no contractures. Normal muscle tone.  Skin: no rashes, lesions, ulcers. No induration Neurologic: CN 2-12 grossly intact. Sensation intact, DTR normal. Strength 5/5 in all 4.  Psychiatric: Normal judgment and insight. Alert and oriented x 3. Normal mood.    Labs on Admission: I have personally reviewed following labs and imaging studies  CBC: Recent Labs  Lab 12/17/17 1552 12/17/17 2045  WBC 15.0* 17.9*  HGB 11.9* 12.6  HCT 35.5* 36.0  MCV 95.4 93.0  PLT  --  572*   Basic Metabolic Panel: Recent Labs  Lab 12/17/17 2045  NA 125*  K 3.4*  CL 88*  CO2 25  GLUCOSE 136*  BUN 8  CREATININE 0.63  CALCIUM 8.4*   GFR: Estimated Creatinine Clearance: 53 mL/min (by C-G formula based on SCr of 0.63 mg/dL). Liver Function Tests: Recent Labs  Lab 12/17/17 2045  AST 14*  ALT 12*  ALKPHOS 63  BILITOT 0.8  PROT 6.9  ALBUMIN 2.8*   Recent Labs  Lab 12/17/17 2045  LIPASE 15   No results for input(s): AMMONIA in the last 168 hours. Coagulation Profile: No results for input(s): INR, PROTIME in the last  168 hours. Cardiac Enzymes: No results for input(s): CKTOTAL, CKMB, CKMBINDEX, TROPONINI in the last 168 hours. BNP (last 3 results) No results for input(s): PROBNP in the last 8760 hours. HbA1C: No results for input(s): HGBA1C in the last 72 hours. CBG: No results for input(s): GLUCAP in the last 168 hours. Lipid Profile:  No results for input(s): CHOL, HDL, LDLCALC, TRIG, CHOLHDL, LDLDIRECT in the last 72 hours. Thyroid Function Tests: No results for input(s): TSH, T4TOTAL, FREET4, T3FREE, THYROIDAB in the last 72 hours. Anemia Panel: No results for input(s): VITAMINB12, FOLATE, FERRITIN, TIBC, IRON, RETICCTPCT in the last 72 hours. Urine analysis:    Component Value Date/Time   COLORURINE AMBER (A) 12/18/2017 0012   APPEARANCEUR CLEAR 12/18/2017 0012   LABSPEC 1.030 12/18/2017 0012   PHURINE 5.0 12/18/2017 0012   GLUCOSEU NEGATIVE 12/18/2017 0012   HGBUR SMALL (A) 12/18/2017 0012   BILIRUBINUR NEGATIVE 12/18/2017 0012   BILIRUBINUR small (A) 12/17/2017 1543   KETONESUR 20 (A) 12/18/2017 0012   PROTEINUR 30 (A) 12/18/2017 0012   UROBILINOGEN 0.2 12/17/2017 1543   NITRITE NEGATIVE 12/18/2017 0012   LEUKOCYTESUR TRACE (A) 12/18/2017 0012   Sepsis Labs: !!!!!!!!!!!!!!!!!!!!!!!!!!!!!!!!!!!!!!!!!!!! @LABRCNTIP (procalcitonin:4,lacticidven:4) ) Recent Results (from the past 240 hour(s))  Blood culture (routine x 2)     Status: None (Preliminary result)   Collection Time: 12/18/17  4:25 AM  Result Value Ref Range Status   Specimen Description   Final    BLOOD RIGHT HAND Performed at [facility], [place] [address], [place], [place] [address]    Special Requests   Final    BOTTLES DRAWN AEROBIC AND ANAEROBIC Blood Culture adequate volume Performed at [facility] [address] [place], [place] [address]    Culture PENDING  Incomplete   Report Status PENDING  Incomplete     Radiological Exams on Admission: Ct Abdomen Pelvis W Contrast  Result  Date: 12/18/2017 CLINICAL DATA:  Watery diarrhea over the last week. Abdominal pain and cramping with fever and elevated white count. History of ulcerative colitis. EXAM: CT ABDOMEN AND PELVIS WITH CONTRAST TECHNIQUE: Multidetector CT imaging of the abdomen and pelvis was performed using the standard protocol following bolus administration of intravenous contrast. CONTRAST:  69m ISOVUE-300 IOPAMIDOL (ISOVUE-300) INJECTION 61% COMPARISON:  Radiography 10/08/2017 FINDINGS: Lower chest: Normal Hepatobiliary: Normal except for a 9 mm cyst at the caudal tip of the right lobe of the liver. Pancreas: Normal Spleen: Normal Adrenals/Urinary Tract: Adrenal glands are normal. Kidneys are normal. Bladder is normal. Stomach/Bowel: Advanced colitis with wall thickening and surrounding edema, most pronounced affecting the transverse and proximal descending colon in the rectosigmoid region. Findings are consistent with the clinical history of ulcerative colitis. No evidence of perforation or abscess. No small bowel abnormality. No sign of bowel obstruction. Vascular/Lymphatic: Minimal aortic atherosclerosis. No aneurysm. IVC is normal. No retroperitoneal adenopathy. Reproductive: Normal Other: No free fluid or air. Musculoskeletal: Curvature in degenerative changes of the spine. IMPRESSION: Acute colitis consistent with the clinical history of ulcerative colitis. Involvement is most pronounced in the transverse colon and proximal descending colon with some involvement also in the rectosigmoid region. No sign of bowel obstruction or perforation. Electronically Signed   By: MNelson ChimesM.D.   On: 12/18/2017 07:14    Old chart reviewed Case discussed with Dr RWyvonnia Dusky  Assessment/Plan 62year old female with a history of ulcerative proctitis comes in with acute diarrheal illness, fever, leukocytosis  Principal Problem:   Acute colitis-unclear if this is infectious or inflammatory.  She has not had involvement of the rest of  her colon in the past per patient report.  Today CT of her abdomen shows involvement in the transverse colon proximal descending colon also with the rectosigmoid region.  Her lactic acid level is normal.  We will empirically cover with IV antibiotics  for infectious source with Cipro and Flagyl.  We will obtain C. difficile and GI panel.  We will also check sed rate in CRP levels.  I am going to hold off on steroids at this time but would consider starting  this if patient does not clinically respond to antibiotics and the above treatment or if her inflammatory markers are elevated.  Provide IV fluids.  Abdominal exam is benign.  Active Problems:   Diarrhea-as above check GI stool panel    Dehydration-IV fluids    Ulcerative colitis confined to rectum (HCC) noted as above am holding off on steroids at this time and treating mostly for infectious source-    SIRS (systemic inflammatory response syndrome) (HCC)-as above    Asthma-continue breathing treatments    DVT prophylaxis: SCDs Code Status: Full Family Communication: None Disposition Plan: Per day team Consults called: None Admission status: Admission   Epifanio Labrador A MD [facility]  If 7PM-7AM, please contact night-coverage [email or web address] Password [facility]  12/18/2017, 8:52 AM   Got call from ED RN pt did not remember speaking to me this am about her plan of care.  I went back to pt room and had same conversation , pt may have been not fully awake this am when she was evaluated by me.  Updated pt in ED and answered all questions.

## 2017-12-18 NOTE — ED Notes (Addendum)
Attempted to call report x2. No ans on floor.  Joe, [facility] said they have 2 total care patients at this time. Abigail Butts RN

## 2017-12-18 NOTE — ED Notes (Signed)
ED TO INPATIENT HANDOFF REPORT  Name/Age/Gender Anita Rice 62 y.o. female  Code Status    Code Status Orders  (From admission, onward)        Start     Ordered   12/18/17 0849  Full code  Continuous     12/18/17 0850    Code Status History    Date Active Date Inactive Code Status Order ID Comments User Context   This patient has a current code status but no historical code status.      Home/SNF/Other Home  Chief Complaint Chest feels heavy, Trouble breathing  Level of Care/Admitting Diagnosis ED Disposition    ED Disposition Condition Comment   Admit  Hospital Area: [facility] [[id number]]  Level of Care: Med-Surg [16]  Diagnosis: Diarrhea [787.91.ICD-9-CM]  Admitting Physician: Phillips Grout [[id number]]  Attending Physician: Derrill Kay A [[id number]]  Estimated length of stay: 3 - 4 days  Certification:: I certify this patient will need inpatient services for at least 2 midnights  PT Class (Do Not Modify): Inpatient [101]  PT Acc Code (Do Not Modify): Private [1]       Medical History Past Medical History:  Diagnosis Date  . Allergy   . Anemia   . Anxiety   . Asthma   . Hyperlipidemia   . Macrocytosis without anemia 06/01/2014  . Thrombocytosis ([place]) 06/01/2014  . Ulcerative colitis confined to rectum (HCC)     Allergies Allergies  Allergen Reactions  . Adderall Xr [Amphetamine-Dextroamphet Er] Diarrhea  . Benadryl [Diphenhydramine] Itching  . Neosporin [Neomycin-Bacitracin Zn-Polymyx] Rash    IV Location/Drains/Wounds Patient Lines/Drains/Airways Status   Active Line/Drains/Airways    Name:   Placement date:   Placement time:   Site:   Days:   Peripheral IV 12/18/17 Right Hand   12/18/17    0100    Hand   less than 1          Labs/Imaging Results for orders placed or performed during the hospital encounter of 12/18/17 (from the past 48 hour(s))  Lipase, blood     Status: None   Collection Time: 12/17/17  8:45 PM  Result  Value Ref Range   Lipase 15 11 - 51 U/L    Comment: Performed at [facility], [place] [address] [place], [place] [address]  Comprehensive metabolic panel     Status: Abnormal   Collection Time: 12/17/17  8:45 PM  Result Value Ref Range   Sodium 125 (L) 135 - 145 mmol/L   Potassium 3.4 (L) 3.5 - 5.1 mmol/L   Chloride 88 (L) 101 - 111 mmol/L   CO2 25 22 - 32 mmol/L   Glucose, Bld 136 (H) 65 - 99 mg/dL   BUN 8 6 - 20 mg/dL   Creatinine, Ser 0.63 0.44 - 1.00 mg/dL   Calcium 8.4 (L) 8.9 - 10.3 mg/dL   Total Protein 6.9 6.5 - 8.1 g/dL   Albumin 2.8 (L) 3.5 - 5.0 g/dL   AST 14 (L) 15 - 41 U/L   ALT 12 (L) 14 - 54 U/L   Alkaline Phosphatase 63 38 - 126 U/L   Total Bilirubin 0.8 0.3 - 1.2 mg/dL   GFR calc non Af Amer >60 >60 mL/min   GFR calc Af Amer >60 >60 mL/min    Comment: (NOTE) The eGFR has been calculated using the CKD EPI equation. This calculation has not been validated in all clinical situations. eGFR's persistently <60 mL/min signify possible Chronic  Kidney Disease.    Anion gap 12 5 - 15    Comment: Performed at [facility], [place] [address] [place], [place] [address]  CBC     Status: Abnormal   Collection Time: 12/17/17  8:45 PM  Result Value Ref Range   WBC 17.9 (H) 4.0 - 10.5 K/uL   RBC 3.87 3.87 - 5.11 MIL/uL   Hemoglobin 12.6 12.0 - 15.0 g/dL   HCT 36.0 36.0 - 46.0 %   MCV 93.0 78.0 - 100.0 fL   MCH 32.6 26.0 - 34.0 pg   MCHC 35.0 30.0 - 36.0 g/dL   RDW 14.3 11.5 - 15.5 %   Platelets 587 (H) 150 - 400 K/uL    Comment: Performed at [facility], [place] [address] [place], [place] [address]  Urinalysis, Routine w reflex microscopic     Status: Abnormal   Collection Time: 12/18/17 12:12 AM  Result Value Ref Range   Color, Urine AMBER (A) YELLOW    Comment: BIOCHEMICALS MAY BE AFFECTED BY COLOR   APPearance CLEAR CLEAR   Specific Gravity, Urine 1.030 1.005 - 1.030   pH 5.0 5.0 - 8.0   Glucose, UA NEGATIVE  NEGATIVE mg/dL   Hgb urine dipstick SMALL (A) NEGATIVE   Bilirubin Urine NEGATIVE NEGATIVE   Ketones, ur 20 (A) NEGATIVE mg/dL   Protein, ur 30 (A) NEGATIVE mg/dL   Nitrite NEGATIVE NEGATIVE   Leukocytes, UA TRACE (A) NEGATIVE   RBC / HPF 0-5 0 - 5 RBC/hpf   WBC, UA 0-5 0 - 5 WBC/hpf   Bacteria, UA NONE SEEN NONE SEEN   Squamous Epithelial / LPF 0-5 (A) NONE SEEN   Mucus PRESENT    Hyaline Casts, UA PRESENT     Comment: Performed at [facility], [place] [address], [place], [place] [address]  Blood culture (routine x 2)     Status: None (Preliminary result)   Collection Time: 12/18/17  4:25 AM  Result Value Ref Range   Specimen Description      BLOOD RIGHT HAND Performed at [facility], [place] [address], [place], [place] [address]    Special Requests      BOTTLES DRAWN AEROBIC AND ANAEROBIC Blood Culture adequate volume Performed at [place] [address], [place], [place] [address]    Culture PENDING    Report Status PENDING   I-Stat CG4 Lactic Acid, ED     Status: None   Collection Time: 12/18/17  4:33 AM  Result Value Ref Range   Lactic Acid, Venous 1.07 0.5 - 1.9 mmol/L  Sedimentation rate     Status: Abnormal   Collection Time: 12/18/17  5:43 PM  Result Value Ref Range   Sed Rate 52 (H) 0 - 22 mm/hr    Comment: Performed at [facility], [place] [address] [place], [place] [address]  C-reactive protein     Status: Abnormal   Collection Time: 12/18/17  5:43 PM  Result Value Ref Range   CRP 24.2 (H) <1.0 mg/dL    Comment: Performed at [place] [address] [place],  [address]   Ct Abdomen Pelvis W Contrast  Result Date: 12/18/2017 CLINICAL DATA:  Watery diarrhea over the last week. Abdominal pain and cramping with fever and elevated white count. History of ulcerative colitis. EXAM: CT ABDOMEN AND PELVIS WITH CONTRAST TECHNIQUE: Multidetector CT imaging of the abdomen and pelvis was  performed using the standard  protocol following bolus administration of intravenous contrast. CONTRAST:  54m ISOVUE-300 IOPAMIDOL (ISOVUE-300) INJECTION 61% COMPARISON:  Radiography 10/08/2017 FINDINGS: Lower chest: Normal Hepatobiliary: Normal except for a 9 mm cyst at the caudal tip of the right lobe of the liver. Pancreas: Normal Spleen: Normal Adrenals/Urinary Tract: Adrenal glands are normal. Kidneys are normal. Bladder is normal. Stomach/Bowel: Advanced colitis with wall thickening and surrounding edema, most pronounced affecting the transverse and proximal descending colon in the rectosigmoid region. Findings are consistent with the clinical history of ulcerative colitis. No evidence of perforation or abscess. No small bowel abnormality. No sign of bowel obstruction. Vascular/Lymphatic: Minimal aortic atherosclerosis. No aneurysm. IVC is normal. No retroperitoneal adenopathy. Reproductive: Normal Other: No free fluid or air. Musculoskeletal: Curvature in degenerative changes of the spine. IMPRESSION: Acute colitis consistent with the clinical history of ulcerative colitis. Involvement is most pronounced in the transverse colon and proximal descending colon with some involvement also in the rectosigmoid region. No sign of bowel obstruction or perforation. Electronically Signed   By: MNelson ChimesM.D.   On: 12/18/2017 07:14    Pending Labs Unresulted Labs (From admission, onward)   Start     Ordered   12/19/17 06578 Basic metabolic panel  Tomorrow morning,   R     12/18/17 0850   12/19/17 0500  CBC  Tomorrow morning,   R     12/18/17 0850   12/18/17 0334  Blood culture (routine x 2)  BLOOD CULTURE X 2,   STAT     12/18/17 0333   12/18/17 0332  Gastrointestinal Panel by PCR , Stool  (Gastrointestinal Panel by PCR, Stool)  Once,   R     12/18/17 0331   12/18/17 0332  C difficile quick scan w PCR reflex  (C Difficile quick screen w PCR reflex panel)  Once, for 48 hours,   R    Comments:  Laxatives  (last 72 hours)   None     Question Answer Comment  Is your patient experiencing loose or watery stools (3 or more in 24 hours)? Yes   Has the patient received laxatives in the last 24 hours? No   Has a negative Cdiff test resulted in the last 7 days? No      12/18/17 0331      Vitals/Pain Today's Vitals   12/18/17 1500 12/18/17 1712 12/18/17 1900 12/18/17 2130  BP: 106/70 113/81 122/84 116/82  Pulse: 91 89 94 96  Resp: 16 16 16 17   Temp:      TempSrc:      SpO2: 99% 99% 99% 97%  Weight:      Height:      PainSc:        Isolation Precautions Enteric precautions (UV disinfection)  Medications Medications  iopamidol (ISOVUE-300) 61 % injection (not administered)  sodium chloride 0.9 % injection (not administered)  conjugated estrogens (PREMARIN) vaginal cream 1 Applicatorful (not administered)  Estradiol vaginal tablet 10 mcg (not administered)  LORazepam (ATIVAN) tablet 0.5 mg (0.5 mg Oral Given 12/18/17 1712)  amphetamine-dextroamphetamine (ADDERALL XR) 24 hr capsule 30 mg (30 mg Oral Refused 12/18/17 1636)  amphetamine-dextroamphetamine (ADDERALL) tablet 10 mg (not administered)  0.9 % NaCl with KCl 20 mEq/ L  infusion ( Intravenous New Bag/Given 12/18/17 1712)  acetaminophen (TYLENOL) tablet 650 mg (not administered)    Or  acetaminophen (TYLENOL) suppository 650 mg (not administered)  ondansetron (ZOFRAN) tablet 4 mg (not administered)    Or  ondansetron (ZOFRAN) injection 4 mg (  not administered)  ciprofloxacin (CIPRO) IVPB 400 mg (400 mg Intravenous New Bag/Given 12/18/17 2216)  metroNIDAZOLE (FLAGYL) IVPB 500 mg (0 mg Intravenous Stopped 12/18/17 1733)  albuterol (PROVENTIL) (2.5 MG/3ML) 0.083% nebulizer solution 2.5 mg (not administered)  ibuprofen (ADVIL,MOTRIN) tablet 400 mg (not administered)  ferrous sulfate tablet 325 mg (not administered)  sodium chloride 0.9 % bolus 1,000 mL (0 mLs Intravenous Stopped 12/18/17 0532)  iopamidol (ISOVUE-300) 61 % injection 30 mL (30  mLs Oral Contrast Given 12/18/17 0441)  sodium chloride 0.9 % bolus 1,000 mL (0 mLs Intravenous Stopped 12/18/17 0647)  iopamidol (ISOVUE-300) 61 % injection 100 mL (80 mLs Intravenous Contrast Given 12/18/17 0646)  metroNIDAZOLE (FLAGYL) IVPB 500 mg (0 mg Intravenous Stopped 12/18/17 1007)  methylPREDNISolone sodium succinate (SOLU-MEDROL) 125 mg/2 mL injection 80 mg (80 mg Intravenous Given 12/18/17 0807)    Mobility walks

## 2017-12-18 NOTE — Telephone Encounter (Signed)
Fexofenadine refill request LR: 11/16/17 #30 tablets  0 RF LOV: 07/20/17 PCP: Juanda Crumble PA Pharmacy: [facility] [id number]

## 2017-12-18 NOTE — ED Notes (Signed)
ED TO INPATIENT HANDOFF REPORT  Name/Age/Gender Anita Rice 62 y.o. female  Code Status    Code Status Orders  (From admission, onward)        Start     Ordered   12/18/17 0849  Full code  Continuous     12/18/17 0850    Code Status History    Date Active Date Inactive Code Status Order ID Comments User Context   This patient has a current code status but no historical code status.      Home/SNF/Other Home  Chief Complaint Chest feels heavy, Trouble breathing  Level of Care/Admitting Diagnosis ED Disposition    ED Disposition Condition Comment   Admit  Hospital Area: [facility] [[id number]]  Level of Care: Med-Surg [16]  Diagnosis: Diarrhea [787.91.ICD-9-CM]  Admitting Physician: Phillips Grout [[id number]]  Attending Physician: Derrill Kay A [[id number]]  Estimated length of stay: 3 - 4 days  Certification:: I certify this patient will need inpatient services for at least 2 midnights  PT Class (Do Not Modify): Inpatient [101]  PT Acc Code (Do Not Modify): Private [1]       Medical History Past Medical History:  Diagnosis Date  . Allergy   . Anemia   . Anxiety   . Asthma   . Hyperlipidemia   . Macrocytosis without anemia 06/01/2014  . Thrombocytosis ([place]) 06/01/2014  . Ulcerative colitis confined to rectum (HCC)     Allergies Allergies  Allergen Reactions  . Adderall Xr [Amphetamine-Dextroamphet Er] Diarrhea  . Benadryl [Diphenhydramine] Itching  . Neosporin [Neomycin-Bacitracin Zn-Polymyx] Rash    IV Location/Drains/Wounds Patient Lines/Drains/Airways Status   Active Line/Drains/Airways    Name:   Placement date:   Placement time:   Site:   Days:   Peripheral IV 12/18/17 Right Hand   12/18/17    0100    Hand   less than 1          Labs/Imaging Results for orders placed or performed during the hospital encounter of 12/18/17 (from the past 48 hour(s))  Lipase, blood     Status: None   Collection Time: 12/17/17  8:45 PM  Result  Value Ref Range   Lipase 15 11 - 51 U/L    Comment: Performed at [facility], [place] [address], [place], [place] [address]  Comprehensive metabolic panel     Status: Abnormal   Collection Time: 12/17/17  8:45 PM  Result Value Ref Range   Sodium 125 (L) 135 - 145 mmol/L   Potassium 3.4 (L) 3.5 - 5.1 mmol/L   Chloride 88 (L) 101 - 111 mmol/L   CO2 25 22 - 32 mmol/L   Glucose, Bld 136 (H) 65 - 99 mg/dL   BUN 8 6 - 20 mg/dL   Creatinine, Ser 0.63 0.44 - 1.00 mg/dL   Calcium 8.4 (L) 8.9 - 10.3 mg/dL   Total Protein 6.9 6.5 - 8.1 g/dL   Albumin 2.8 (L) 3.5 - 5.0 g/dL   AST 14 (L) 15 - 41 U/L   ALT 12 (L) 14 - 54 U/L   Alkaline Phosphatase 63 38 - 126 U/L   Total Bilirubin 0.8 0.3 - 1.2 mg/dL   GFR calc non Af Amer >60 >60 mL/min   GFR calc Af Amer >60 >60 mL/min    Comment: (NOTE) The eGFR has been calculated using the CKD EPI equation. This calculation has not been validated in all clinical situations. eGFR's persistently <60 mL/min signify possible Chronic  Kidney Disease.    Anion gap 12 5 - 15    Comment: Performed at [facility], [place] [address], [place], [place] [address]  CBC     Status: Abnormal   Collection Time: 12/17/17  8:45 PM  Result Value Ref Range   WBC 17.9 (H) 4.0 - 10.5 K/uL   RBC 3.87 3.87 - 5.11 MIL/uL   Hemoglobin 12.6 12.0 - 15.0 g/dL   HCT 36.0 36.0 - 46.0 %   MCV 93.0 78.0 - 100.0 fL   MCH 32.6 26.0 - 34.0 pg   MCHC 35.0 30.0 - 36.0 g/dL   RDW 14.3 11.5 - 15.5 %   Platelets 587 (H) 150 - 400 K/uL    Comment: Performed at [facility], [place] [address] [place], [place] [address]  Urinalysis, Routine w reflex microscopic     Status: Abnormal   Collection Time: 12/18/17 12:12 AM  Result Value Ref Range   Color, Urine AMBER (A) YELLOW    Comment: BIOCHEMICALS MAY BE AFFECTED BY COLOR   APPearance CLEAR CLEAR   Specific Gravity, Urine 1.030 1.005 - 1.030   pH 5.0 5.0 - 8.0   Glucose, UA NEGATIVE  NEGATIVE mg/dL   Hgb urine dipstick SMALL (A) NEGATIVE   Bilirubin Urine NEGATIVE NEGATIVE   Ketones, ur 20 (A) NEGATIVE mg/dL   Protein, ur 30 (A) NEGATIVE mg/dL   Nitrite NEGATIVE NEGATIVE   Leukocytes, UA TRACE (A) NEGATIVE   RBC / HPF 0-5 0 - 5 RBC/hpf   WBC, UA 0-5 0 - 5 WBC/hpf   Bacteria, UA NONE SEEN NONE SEEN   Squamous Epithelial / LPF 0-5 (A) NONE SEEN   Mucus PRESENT    Hyaline Casts, UA PRESENT     Comment: Performed at [facility], [place] [address], [place], [place] [address]  Blood culture (routine x 2)     Status: None (Preliminary result)   Collection Time: 12/18/17  4:25 AM  Result Value Ref Range   Specimen Description      BLOOD RIGHT HAND Performed at [facility], [place] [address], [place], [place] [address]    Special Requests      BOTTLES DRAWN AEROBIC AND ANAEROBIC Blood Culture adequate volume Performed at [place] [address], [place], [place] [address]    Culture PENDING    Report Status PENDING   I-Stat CG4 Lactic Acid, ED     Status: None   Collection Time: 12/18/17  4:33 AM  Result Value Ref Range   Lactic Acid, Venous 1.07 0.5 - 1.9 mmol/L  Sedimentation rate     Status: Abnormal   Collection Time: 12/18/17  5:43 PM  Result Value Ref Range   Sed Rate 52 (H) 0 - 22 mm/hr    Comment: Performed at [facility], [place] [address], [place], [place] [address]   Ct Abdomen Pelvis W Contrast  Result Date: 12/18/2017 CLINICAL DATA:  Watery diarrhea over the last week. Abdominal pain and cramping with fever and elevated white count. History of ulcerative colitis. EXAM: CT ABDOMEN AND PELVIS WITH CONTRAST TECHNIQUE: Multidetector CT imaging of the abdomen and pelvis was performed using the standard protocol following bolus administration of intravenous contrast. CONTRAST:  38m ISOVUE-300 IOPAMIDOL (ISOVUE-300) INJECTION 61% COMPARISON:  Radiography 10/08/2017 FINDINGS: Lower chest: Normal  Hepatobiliary: Normal except for a 9 mm cyst at the caudal tip of the right lobe of the liver. Pancreas: Normal Spleen: Normal  Adrenals/Urinary Tract: Adrenal glands are normal. Kidneys are normal. Bladder is normal. Stomach/Bowel: Advanced colitis with wall thickening and surrounding edema, most pronounced affecting the transverse and proximal descending colon in the rectosigmoid region. Findings are consistent with the clinical history of ulcerative colitis. No evidence of perforation or abscess. No small bowel abnormality. No sign of bowel obstruction. Vascular/Lymphatic: Minimal aortic atherosclerosis. No aneurysm. IVC is normal. No retroperitoneal adenopathy. Reproductive: Normal Other: No free fluid or air. Musculoskeletal: Curvature in degenerative changes of the spine. IMPRESSION: Acute colitis consistent with the clinical history of ulcerative colitis. Involvement is most pronounced in the transverse colon and proximal descending colon with some involvement also in the rectosigmoid region. No sign of bowel obstruction or perforation. Electronically Signed   By: Nelson Chimes M.D.   On: 12/18/2017 07:14    Pending Labs Unresulted Labs (From admission, onward)   Start     Ordered   12/19/17 2694  Basic metabolic panel  Tomorrow morning,   R     12/18/17 0850   12/19/17 0500  CBC  Tomorrow morning,   R     12/18/17 0850   12/18/17 0850  C-reactive protein  Once,   R     12/18/17 0850   12/18/17 0334  Blood culture (routine x 2)  BLOOD CULTURE X 2,   STAT     12/18/17 0333   12/18/17 0332  Gastrointestinal Panel by PCR , Stool  (Gastrointestinal Panel by PCR, Stool)  Once,   R     12/18/17 0331   12/18/17 0332  C difficile quick scan w PCR reflex  (C Difficile quick screen w PCR reflex panel)  Once, for 48 hours,   R    Comments:  Laxatives (last 72 hours)   None     Question Answer Comment  Is your patient experiencing loose or watery stools (3 or more in 24 hours)? Yes   Has the patient  received laxatives in the last 24 hours? No   Has a negative Cdiff test resulted in the last 7 days? No      12/18/17 0331      Vitals/Pain Today's Vitals   12/18/17 1326 12/18/17 1500 12/18/17 1712 12/18/17 1900  BP: 121/82 106/70 113/81 122/84  Pulse: 95 91 89 94  Resp: 16 16 16 16   Temp: (!) 97.5 F (36.4 C)     TempSrc:      SpO2: 98% 99% 99% 99%  Weight:      Height:      PainSc:        Isolation Precautions Enteric precautions (UV disinfection)  Medications Medications  iopamidol (ISOVUE-300) 61 % injection (not administered)  sodium chloride 0.9 % injection (not administered)  conjugated estrogens (PREMARIN) vaginal cream 1 Applicatorful (not administered)  Estradiol vaginal tablet 10 mcg (not administered)  LORazepam (ATIVAN) tablet 0.5 mg (0.5 mg Oral Given 12/18/17 1712)  amphetamine-dextroamphetamine (ADDERALL XR) 24 hr capsule 30 mg (30 mg Oral Refused 12/18/17 1636)  amphetamine-dextroamphetamine (ADDERALL) tablet 10 mg (not administered)  0.9 % NaCl with KCl 20 mEq/ L  infusion ( Intravenous New Bag/Given 12/18/17 1712)  acetaminophen (TYLENOL) tablet 650 mg (not administered)    Or  acetaminophen (TYLENOL) suppository 650 mg (not administered)  ondansetron (ZOFRAN) tablet 4 mg (not administered)    Or  ondansetron (ZOFRAN) injection 4 mg (not administered)  ciprofloxacin (CIPRO) IVPB 400 mg (0 mg Intravenous Stopped 12/18/17 1003)  metroNIDAZOLE (FLAGYL) IVPB 500 mg (0 mg Intravenous Stopped  12/18/17 1733)  albuterol (PROVENTIL) (2.5 MG/3ML) 0.083% nebulizer solution 2.5 mg (not administered)  ibuprofen (ADVIL,MOTRIN) tablet 400 mg (not administered)  ferrous sulfate tablet 325 mg (not administered)  sodium chloride 0.9 % bolus 1,000 mL (0 mLs Intravenous Stopped 12/18/17 0532)  iopamidol (ISOVUE-300) 61 % injection 30 mL (30 mLs Oral Contrast Given 12/18/17 0441)  sodium chloride 0.9 % bolus 1,000 mL (0 mLs Intravenous Stopped 12/18/17 0647)  iopamidol (ISOVUE-300) 61  % injection 100 mL (80 mLs Intravenous Contrast Given 12/18/17 0646)  metroNIDAZOLE (FLAGYL) IVPB 500 mg (0 mg Intravenous Stopped 12/18/17 1007)  methylPREDNISolone sodium succinate (SOLU-MEDROL) 125 mg/2 mL injection 80 mg (80 mg Intravenous Given 12/18/17 0807)    Mobility walks

## 2017-12-18 NOTE — ED Provider Notes (Signed)
[facility] Provider Note   CSN: [id number] Arrival date & time: 12/17/17  1732     History   Chief Complaint Chief Complaint  Patient presents with  . Diarrhea  . Tachycardia    HPI Anita Rice is a 62 y.o. female.  Patient sent by PCP with 1 week history of diarrhea, abdominal cramping and fever.  She describes worsening diarrhea up to 12-24 times a day with liquid stools that is nonbloody.  Developed fever 4 days ago up to 103.  No sick contacts.  No recent antibiotic use or travel.  No nausea or vomiting.  Able to drink some fluids.  Patient with a history of ulcerative proctitis but has not had a flare for many years.  Had a normal colonoscopy 5 years ago.  Had an episode of diarrhea in December that improved with prednisone with negative stool studies at that time.  She was seen at urgent care on March 1 was found to be febrile and tachycardic with negative flu testing started on Tamiflu.  Her PCP today was concerned about her abdominal pain tachycardia and fever and sent her to the ED for fluids and imaging.  Patient denies taking any medications for ulcerative colitis currently.   The history is provided by the patient.  Diarrhea   Associated symptoms include abdominal pain and chills. Pertinent negatives include no vomiting, no headaches, no arthralgias, no myalgias and no cough.    Past Medical History:  Diagnosis Date  . Allergy   . Anemia   . Anxiety   . Asthma   . Hyperlipidemia   . Macrocytosis without anemia 06/01/2014  . Thrombocytosis ([place]) 06/01/2014  . Ulcerative colitis confined to rectum [facility])     Patient Active Problem List   Diagnosis Date Noted  . Macrocytosis without anemia 06/01/2014  . Thrombocytosis ([place]) 06/01/2014  . Alcoholic peripheral neuropathy ([place]) 06/01/2014    Past Surgical History:  Procedure Laterality Date  . COLONOSCOPY W/ BIOPSIES    . COLONOSCOPY WITH PROPOFOL N/A 07/07/2014   Procedure:  COLONOSCOPY WITH PROPOFOL;  Surgeon: Garlan Fair, MD;  Location: [facility];  Service: Endoscopy;  Laterality: N/A;  . NASAL SEPTUM SURGERY    . TONSILLECTOMY      OB History    No data available       Home Medications    Prior to Admission medications   Medication Sig Start Date End Date Taking? Authorizing Provider  acetaminophen (TYLENOL) 500 MG tablet Take 500 mg by mouth every 6 (six) hours as needed for mild pain.    Yes [provider]  albuterol (PROVENTIL HFA;VENTOLIN HFA) 108 (90 Base) MCG/ACT inhaler Inhale 2 puffs into the lungs every 6 (six) hours as needed for wheezing or shortness of breath. 11/27/16  Yes McVey, Gelene Mink, PA-C  amphetamine-dextroamphetamine (ADDERALL XR) 30 MG 24 hr capsule Take 1 capsule (30 mg total) by mouth every morning. 12/15/17 01/14/18 Yes McVey, Gelene Mink, PA-C  budesonide-formoterol (SYMBICORT) 160-4.5 MCG/ACT inhaler Inhale 2 puffs into the lungs every morning. 11/27/16  Yes McVey, Gelene Mink, PA-C  conjugated estrogens (PREMARIN) vaginal cream Place 1 Applicatorful vaginally daily. 04/10/17  Yes McVey, Gelene Mink, PA-C  Ferrous Sulfate (IRON) 28 MG TABS Take 1 tablet by mouth every morning.    Yes [provider]  fexofenadine (ALLEGRA) 180 MG tablet TAKE 1 TABLET BY MOUTH EVERY DAY 11/16/17  Yes McVey, Gelene Mink, PA-C  fluocinolone (VANOS) 0.01 % cream Apply topically  2 (two) times daily. 04/10/17  Yes McVey, Gelene Mink, PA-C  fluticasone (FLONASE) 50 MCG/ACT nasal spray USE 2 SPRAYS IN THE NOSE ONCE DAILY AS DIRECTED 04/28/17  Yes McVey, Gelene Mink, PA-C  LORazepam (ATIVAN) 0.5 MG tablet Take 1 tablet (0.5 mg total) by mouth 2 (two) times daily as needed for anxiety. 12/15/17 01/14/18 Yes McVey, Gelene Mink, PA-C  Multiple Vitamins-Minerals (CENTRUM SILVER PO) Take 1 tablet by mouth every morning.    Yes [provider]  oseltamivir (TAMIFLU) 75 MG capsule Take 75 mg  by mouth 2 (two) times daily.    Yes [provider]  YUVAFEM 10 MCG TABS vaginal tablet PLACE 1 TABLET (10 MCG TOTAL) VAGINALLY 2 (TWO) TIMES A WEEK. 10/04/17  Yes McVey, Gelene Mink, PA-C  amphetamine-dextroamphetamine (ADDERALL XR) 10 MG 24 hr capsule Take 1 capsule (10 mg total) by mouth daily. Take with 31m for a total of 332mqd. 11/19/17   McVey, ElGelene MinkPA-C  amphetamine-dextroamphetamine (ADDERALL XR) 20 MG 24 hr capsule Take 1 capsule (20 mg total) by mouth every morning. Take with 10110mor a total of 62m41m. 11/19/17   McVey, ElizGelene Mink-C  amphetamine-dextroamphetamine (ADDERALL XR) 30 MG 24 hr capsule Take 1 capsule (30 mg total) by mouth every morning. 11/17/17 12/17/17  McVey, ElizGelene Mink-C  amphetamine-dextroamphetamine (ADDERALL XR) 30 MG 24 hr capsule Take 1 capsule (30 mg total) by mouth every morning. 10/17/17 11/16/17  McVey, ElizGelene Mink-C  amphetamine-dextroamphetamine (ADDERALL) 10 MG tablet Take 1 tablet (10 mg total) by mouth daily. You may take this in addition to your Adderall 62mg57m 12/15/17 01/14/18  McVey, ElizaGelene MinkC  amphetamine-dextroamphetamine (ADDERALL) 10 MG tablet Take 1 tablet (10 mg total) by mouth daily. You may take this in addition to Adderall 62mg 63m2/2/19 12/17/17  McVey, ElizabGelene Mink  amphetamine-dextroamphetamine (ADDERALL) 10 MG tablet Take 1 tablet (10 mg total) by mouth daily. You may take in addition to Adderall 62mg P59m/2/19 11/16/17  McVey, ElizabeGelene Mink atomoxetine (STRATTERA) 40 MG capsule Take 1 capsule (40 mg total) by mouth daily. May increase dose after 3 days. Patient not taking: Reported on 12/17/2017 04/28/17   McVey, ElizabeGelene Mink diphenoxylate-atropine (LOMOTIL) 2.5-0.025 MG tablet Take 2 tablets by mouth 4 (four) times daily as needed for diarrhea or loose stools. Patient not taking: Reported on 12/17/2017 10/08/17   McVey, ElizabeGelene Mink   ibuprofen (ADVIL,MOTRIN) 200 MG tablet Take 400 mg by mouth every 6 (six) hours as needed for headache or mild pain.     [provider]  lisdexamfetamine (VYVANSE) 10 MG capsule Take 1 capsule (10 mg total) by mouth daily. May take with 62mg fo80mtotal of 40mg qd.104m5/18 08/19/17  McVey, ElizabethGelene Minkisdexamfetamine (VYVANSE) 10 MG capsule Take 1 capsule (10 mg total) by mouth daily. May take with 62mg for 44mtal of 40mg if ne11m. 08/20/17 09/19/17  McVey, Elizabeth WGelene Minkdexamfetamine (VYVANSE) 10 MG capsule Take 1 capsule (10 mg total) by mouth daily. May take with 62mg daily 107mtotal of 40mg 12/5/1851m/19  McVey, Elizabeth WhiGelene Minkxamfetamine (VYVANSE) 30 MG capsule Take 1 capsule (30 mg total) by mouth every morning. May increase in increments of 10 mg at weekly intervals. 07/20/17 08/19/17  McVey, Elizabeth WhiGelene Minkxamfetamine (VYVANSE) 30 MG capsule Take 1 capsule (30 mg total) by mouth daily. May increase in increments of 10  mg at weekly intervals. 08/20/17 09/19/17  McVey, Gelene Mink, PA-C  lisdexamfetamine (VYVANSE) 30 MG capsule Take 1 capsule (30 mg total) by mouth daily. May increase in increments of 10 mg at weekly intervals. 09/19/17 10/19/17  McVey, Gelene Mink, PA-C    Family History Family History  Problem Relation Age of Onset  . Cancer Paternal Grandfather        lung ca  . CAD Father        Cabg at age 50  . CAD Mother        MI at age 77  . Stroke Maternal Grandfather   . Cancer Maternal Grandfather   . Heart disease Paternal Grandmother     Social History Social History   Tobacco Use  . Smoking status: Former Research scientist (life sciences)  . Smokeless tobacco: Never Used  Substance Use Topics  . Alcohol use: Yes    Comment: 5-7 nights a week   . Drug use: No     Allergies   Adderall xr [amphetamine-dextroamphet er]; Benadryl [diphenhydramine]; and Neosporin [neomycin-bacitracin zn-polymyx]   Review of  Systems Review of Systems  Constitutional: Positive for activity change, appetite change, chills and fatigue.  Respiratory: Negative for cough, chest tightness and shortness of breath.   Cardiovascular: Negative for chest pain.  Gastrointestinal: Positive for abdominal pain, diarrhea and nausea. Negative for anal bleeding, blood in stool and vomiting.  Genitourinary: Negative for dysuria, hematuria, vaginal bleeding and vaginal discharge.  Musculoskeletal: Negative for arthralgias and myalgias.  Skin: Negative for rash.  Neurological: Positive for weakness. Negative for dizziness and headaches.   all other systems are negative except as noted in the HPI and PMH.     Physical Exam Updated Vital Signs BP 133/76 (BP Location: Left Arm)   Pulse (!) 107   Temp 99.5 F (37.5 C) (Oral)   Resp 18   Ht 5' (1.524 m)   Wt 49 kg (108 lb)   SpO2 97%   BMI 21.09 kg/m   Physical Exam  Constitutional: She is oriented to person, place, and time. She appears well-developed and well-nourished. No distress.  Dry mucous membranes  HENT:  Head: Normocephalic and atraumatic.  Mouth/Throat: Oropharynx is clear and moist. No oropharyngeal exudate.  Eyes: Conjunctivae and EOM are normal. Pupils are equal, round, and reactive to light.  Neck: Normal range of motion. Neck supple.  No meningismus.  Cardiovascular: Normal rate, normal heart sounds and intact distal pulses.  No murmur heard. Tachycardia 120s  Pulmonary/Chest: Effort normal and breath sounds normal. No respiratory distress.  Abdominal: Soft. There is tenderness. There is no rebound and no guarding.  Mild diffuse tenderness, worse in the epigastrium and left lower quadrant.  Musculoskeletal: Normal range of motion. She exhibits no edema or tenderness.  Neurological: She is alert and oriented to person, place, and time. No cranial nerve deficit. She exhibits normal muscle tone. Coordination normal.   5/5 strength throughout. CN 2-12  intact.Equal grip strength.   Skin: Skin is warm.  Psychiatric: She has a normal mood and affect. Her behavior is normal.  Nursing note and vitals reviewed.    ED Treatments / Results  Labs (all labs ordered are listed, but only abnormal results are displayed) Labs Reviewed  COMPREHENSIVE METABOLIC PANEL - Abnormal; Notable for the following components:      Result Value   Sodium 125 (*)    Potassium 3.4 (*)    Chloride 88 (*)    Glucose, Bld 136 (*)    Calcium  8.4 (*)    Albumin 2.8 (*)    AST 14 (*)    ALT 12 (*)    All other components within normal limits  CBC - Abnormal; Notable for the following components:   WBC 17.9 (*)    Platelets 587 (*)    All other components within normal limits  URINALYSIS, ROUTINE W REFLEX MICROSCOPIC - Abnormal; Notable for the following components:   Color, Urine AMBER (*)    Hgb urine dipstick SMALL (*)    Ketones, ur 20 (*)    Protein, ur 30 (*)    Leukocytes, UA TRACE (*)    Squamous Epithelial / LPF 0-5 (*)    All other components within normal limits  CULTURE, BLOOD (ROUTINE X 2)  GASTROINTESTINAL PANEL BY PCR, STOOL (REPLACES STOOL CULTURE)  C DIFFICILE QUICK SCREEN W PCR REFLEX  CULTURE, BLOOD (ROUTINE X 2)  LIPASE, BLOOD  I-STAT CG4 LACTIC ACID, ED    EKG  EKG Interpretation None       Radiology Ct Abdomen Pelvis W Contrast  Result Date: 12/18/2017 CLINICAL DATA:  Watery diarrhea over the last week. Abdominal pain and cramping with fever and elevated white count. History of ulcerative colitis. EXAM: CT ABDOMEN AND PELVIS WITH CONTRAST TECHNIQUE: Multidetector CT imaging of the abdomen and pelvis was performed using the standard protocol following bolus administration of intravenous contrast. CONTRAST:  74m ISOVUE-300 IOPAMIDOL (ISOVUE-300) INJECTION 61% COMPARISON:  Radiography 10/08/2017 FINDINGS: Lower chest: Normal Hepatobiliary: Normal except for a 9 mm cyst at the caudal tip of the right lobe of the liver. Pancreas:  Normal Spleen: Normal Adrenals/Urinary Tract: Adrenal glands are normal. Kidneys are normal. Bladder is normal. Stomach/Bowel: Advanced colitis with wall thickening and surrounding edema, most pronounced affecting the transverse and proximal descending colon in the rectosigmoid region. Findings are consistent with the clinical history of ulcerative colitis. No evidence of perforation or abscess. No small bowel abnormality. No sign of bowel obstruction. Vascular/Lymphatic: Minimal aortic atherosclerosis. No aneurysm. IVC is normal. No retroperitoneal adenopathy. Reproductive: Normal Other: No free fluid or air. Musculoskeletal: Curvature in degenerative changes of the spine. IMPRESSION: Acute colitis consistent with the clinical history of ulcerative colitis. Involvement is most pronounced in the transverse colon and proximal descending colon with some involvement also in the rectosigmoid region. No sign of bowel obstruction or perforation. Electronically Signed   By: MNelson ChimesM.D.   On: 12/18/2017 07:14    Procedures Procedures (including critical care time)  Medications Ordered in ED Medications  sodium chloride 0.9 % bolus 1,000 mL (not administered)     Initial Impression / Assessment and Plan / ED Course  I have reviewed the triage vital signs and the nursing notes.  Pertinent labs & imaging results that were available during my care of the patient were reviewed by me and considered in my medical decision making (see chart for details).     Patient sent from PCP with 1 week of diarrhea, fever and abdominal cramping.  She will be given IV fluids, labs will be obtained.  No C. difficile risk factors.  Will obtain CT imaging to evaluate for potential colitis exacerbation  Labs show mild hyponatremia of 125 IVF and antiemetics given.  CT with diffuse colitis.  Consider UC exacerbation versus viral syndrome. Will treat however with antibiotics, steroids.  Still tachycardic after 2L.   Lactate normal.  Admission for hydration and symptom control dw Dr. DShanon Brow  Final Clinical Impressions(s) / ED Diagnoses   Final diagnoses:  Colitis  Dehydration    ED Discharge Orders    None       Mallisa Alameda, Annie Main, MD 12/18/17 [id number]

## 2017-12-19 DIAGNOSIS — E876 Hypokalemia: Secondary | ICD-10-CM

## 2017-12-19 DIAGNOSIS — E871 Hypo-osmolality and hyponatremia: Secondary | ICD-10-CM

## 2017-12-19 LAB — GASTROINTESTINAL PANEL BY PCR, STOOL (REPLACES STOOL CULTURE)
Adenovirus F40/41: NOT DETECTED
Astrovirus: NOT DETECTED
CRYPTOSPORIDIUM: NOT DETECTED
Campylobacter species: NOT DETECTED
Cyclospora cayetanensis: NOT DETECTED
ENTAMOEBA HISTOLYTICA: NOT DETECTED
ENTEROAGGREGATIVE E COLI (EAEC): NOT DETECTED
Enteropathogenic E coli (EPEC): NOT DETECTED
Enterotoxigenic E coli (ETEC): NOT DETECTED
GIARDIA LAMBLIA: NOT DETECTED
NOROVIRUS GI/GII: NOT DETECTED
Plesimonas shigelloides: NOT DETECTED
Rotavirus A: NOT DETECTED
SAPOVIRUS (I, II, IV, AND V): NOT DETECTED
SHIGA LIKE TOXIN PRODUCING E COLI (STEC): NOT DETECTED
SHIGELLA/ENTEROINVASIVE E COLI (EIEC): NOT DETECTED
Salmonella species: NOT DETECTED
VIBRIO CHOLERAE: NOT DETECTED
Vibrio species: NOT DETECTED
YERSINIA ENTEROCOLITICA: NOT DETECTED

## 2017-12-19 LAB — CBC
HEMATOCRIT: 31.6 % — AB (ref 36.0–46.0)
HEMOGLOBIN: 10.6 g/dL — AB (ref 12.0–15.0)
MCH: 32.1 pg (ref 26.0–34.0)
MCHC: 33.5 g/dL (ref 30.0–36.0)
MCV: 95.8 fL (ref 78.0–100.0)
Platelets: 587 10*3/uL — ABNORMAL HIGH (ref 150–400)
RBC: 3.3 MIL/uL — ABNORMAL LOW (ref 3.87–5.11)
RDW: 14.7 % (ref 11.5–15.5)
WBC: 18 10*3/uL — ABNORMAL HIGH (ref 4.0–10.5)

## 2017-12-19 LAB — MAGNESIUM: Magnesium: 2 mg/dL (ref 1.7–2.4)

## 2017-12-19 LAB — BASIC METABOLIC PANEL
ANION GAP: 8 (ref 5–15)
BUN: 6 mg/dL (ref 6–20)
CHLORIDE: 99 mmol/L — AB (ref 101–111)
CO2: 26 mmol/L (ref 22–32)
Calcium: 7.8 mg/dL — ABNORMAL LOW (ref 8.9–10.3)
Creatinine, Ser: 0.55 mg/dL (ref 0.44–1.00)
GFR calc Af Amer: >60 mL/min (ref >60)
GFR calc non Af Amer: >60 mL/min (ref >60)
Glucose, Bld: 122 mg/dL — ABNORMAL HIGH (ref 65–99)
POTASSIUM: 3.2 mmol/L — AB (ref 3.5–5.1)
Sodium: 133 mmol/L — ABNORMAL LOW (ref 135–145)

## 2017-12-19 LAB — C DIFFICILE QUICK SCREEN W PCR REFLEX
C DIFFICLE (CDIFF) ANTIGEN: NEGATIVE
C Diff interpretation: NOT DETECTED
C Diff toxin: NEGATIVE

## 2017-12-19 MED ORDER — LOPERAMIDE HCL 2 MG PO CAPS
4.0000 mg | ORAL_CAPSULE | Freq: Once | ORAL | Status: AC
  Administered 2017-12-19: 4 mg via ORAL
  Filled 2017-12-19: qty 2

## 2017-12-19 MED ORDER — POTASSIUM CHLORIDE CRYS ER 20 MEQ PO TBCR
40.0000 meq | EXTENDED_RELEASE_TABLET | ORAL | Status: AC
  Administered 2017-12-19 (×2): 40 meq via ORAL
  Filled 2017-12-19 (×2): qty 2

## 2017-12-19 MED ORDER — LOPERAMIDE HCL 2 MG PO CAPS
2.0000 mg | ORAL_CAPSULE | Freq: Three times a day (TID) | ORAL | Status: DC | PRN
  Administered 2017-12-20: 2 mg via ORAL
  Filled 2017-12-19 (×2): qty 1

## 2017-12-19 NOTE — Progress Notes (Signed)
[facility] PROGRESS NOTE  Anita Rice RFX:[id number] DOB: Oct 02, 1956 DOA: 12/18/2017  PCP: [name], PA-C  Brief History/Interval Summary: 62 year old Caucasian female with a past medical history of ulcerative proctitis who has not followed up with her gastroenterologist, Dr. Wynetta Emery with [facility], in the last 5 years.  Presented with watery diarrhea which was nonbloody.  Imaging studies raised concern for acute colitis.  She was hospitalized for further management.  Reason for Visit: Acute diarrhea/colitis  Consultants: None  Procedures: None  Antibiotics: Cipro and Flagyl  Subjective/Interval History: Patient continues to have watery stool.  She has had for this morning.  Denies any mucus or blood in the stools.  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  No new medications recently except for Tamiflu which was given to her last week.  ROS: Denies any chest pain or shortness of breath.  Objective:  Vital Signs  Vitals:   12/18/17 2130 12/18/17 2300 12/19/17 0041 12/19/17 0635  BP: 116/82 128/81 122/84 136/81  Pulse: 96 87 86 90  Resp: _0 Temp:   98.2 F (36.8 C) 97.9 F (36.6 C)  TempSrc:   Oral Oral  SpO2: 97% 100% 98% 98%  Weight:   51 kg (112 lb 7 oz)   Height:   5' (1.524 m)     Intake/Output Summary (Last 24 hours) at 12/19/2017 1420 Last data filed at 12/19/2017 2641 Gross per 24 hour  Intake 1801.67 ml  Output -  Net 1801.67 ml   Filed Weights   12/17/17 1900 12/19/17 0041  Weight: 49 kg (108 lb) 51 kg (112 lb 7 oz)    General appearance: alert, cooperative, appears stated age and no distress Resp: clear to auscultation bilaterally Cardio: regular rate and rhythm, S1, S2 normal, no murmur, click, rub or gallop GI: Abdomen is soft.  Nontender nondistended.  Bowel sounds are present.  No masses organomegaly Extremities: extremities normal, atraumatic, no cyanosis or edema Neurologic: No focal neurological  deficits.  Lab Results:  Data Reviewed: I have personally reviewed following labs and imaging studies  CBC: Recent Labs  Lab 12/17/17 1552 12/17/17 2045 12/19/17 0347  WBC 15.0* 17.9* 18.0*  HGB 11.9* 12.6 10.6*  HCT 35.5* 36.0 31.6*  MCV 95.4 93.0 95.8  PLT  --  587* 587*    Basic Metabolic Panel: Recent Labs  Lab 12/17/17 2045 12/19/17 0347  NA 125* 133*  K 3.4* 3.2*  CL 88* 99*  CO2 25 26  GLUCOSE 136* 122*  BUN 8 6  CREATININE 0.63 0.55  CALCIUM 8.4* 7.8*  MG  --  2.0    GFR: Estimated Creatinine Clearance: 53 mL/min (by C-G formula based on SCr of 0.55 mg/dL).  Liver Function Tests: Recent Labs  Lab 12/17/17 2045  AST 14*  ALT 12*  ALKPHOS 63  BILITOT 0.8  PROT 6.9  ALBUMIN 2.8*    Recent Labs  Lab 12/17/17 2045  LIPASE 15    Recent Results (from the past 240 hour(s))  Gastrointestinal Panel by PCR , Stool     Status: None   Collection Time: 12/18/17  3:32 AM  Result Value Ref Range Status   Campylobacter species NOT DETECTED NOT DETECTED Final   Plesimonas shigelloides NOT DETECTED NOT DETECTED Final   Salmonella species NOT DETECTED NOT DETECTED Final   Yersinia enterocolitica NOT DETECTED NOT DETECTED Final   Vibrio species NOT DETECTED NOT DETECTED Final   Vibrio cholerae NOT DETECTED  NOT DETECTED Final   Enteroaggregative E coli (EAEC) NOT DETECTED NOT DETECTED Final   Enteropathogenic E coli (EPEC) NOT DETECTED NOT DETECTED Final   Enterotoxigenic E coli (ETEC) NOT DETECTED NOT DETECTED Final   Shiga like toxin producing E coli (STEC) NOT DETECTED NOT DETECTED Final   Shigella/Enteroinvasive E coli (EIEC) NOT DETECTED NOT DETECTED Final   Cryptosporidium NOT DETECTED NOT DETECTED Final   Cyclospora cayetanensis NOT DETECTED NOT DETECTED Final   Entamoeba histolytica NOT DETECTED NOT DETECTED Final   Giardia lamblia NOT DETECTED NOT DETECTED Final   Adenovirus F40/41 NOT DETECTED NOT DETECTED Final   Astrovirus NOT DETECTED NOT  DETECTED Final   Norovirus GI/GII NOT DETECTED NOT DETECTED Final   Rotavirus A NOT DETECTED NOT DETECTED Final   Sapovirus (I, II, IV, and V) NOT DETECTED NOT DETECTED Final    Comment: Performed at [facility], [address] [place], [place] [address]  C difficile quick scan w PCR reflex     Status: None   Collection Time: 12/18/17  3:32 AM  Result Value Ref Range Status   C Diff antigen NEGATIVE NEGATIVE Final   C Diff toxin NEGATIVE NEGATIVE Final   C Diff interpretation No C. difficile detected.  Final    Comment: Performed at [facility], [place] [address] [place], [place] [address]  Blood culture (routine x 2)     Status: None (Preliminary result)   Collection Time: 12/18/17  4:25 AM  Result Value Ref Range Status   Specimen Description   Final    BLOOD RIGHT HAND Performed at [facility], [place] [address] [place], [place] [address]    Special Requests   Final    BOTTLES DRAWN AEROBIC AND ANAEROBIC Blood Culture adequate volume Performed at [facility] [address], [place], [place] [address]    Culture PENDING  Incomplete   Report Status PENDING  Incomplete      Radiology Studies: Ct Abdomen Pelvis W Contrast  Result Date: 12/18/2017 CLINICAL DATA:  Watery diarrhea over the last week. Abdominal pain and cramping with fever and elevated white count. History of ulcerative colitis. EXAM: CT ABDOMEN AND PELVIS WITH CONTRAST TECHNIQUE: Multidetector CT imaging of the abdomen and pelvis was performed using the standard protocol following bolus administration of intravenous contrast. CONTRAST:  63m ISOVUE-300 IOPAMIDOL (ISOVUE-300) INJECTION 61% COMPARISON:  Radiography 10/08/2017 FINDINGS: Lower chest: Normal Hepatobiliary: Normal except for a 9 mm cyst at the caudal tip of the right lobe of the liver. Pancreas: Normal Spleen: Normal Adrenals/Urinary Tract: Adrenal glands are normal. Kidneys are normal. Bladder is normal.  Stomach/Bowel: Advanced colitis with wall thickening and surrounding edema, most pronounced affecting the transverse and proximal descending colon in the rectosigmoid region. Findings are consistent with the clinical history of ulcerative colitis. No evidence of perforation or abscess. No small bowel abnormality. No sign of bowel obstruction. Vascular/Lymphatic: Minimal aortic atherosclerosis. No aneurysm. IVC is normal. No retroperitoneal adenopathy. Reproductive: Normal Other: No free fluid or air. Musculoskeletal: Curvature in degenerative changes of the spine. IMPRESSION: Acute colitis consistent with the clinical history of ulcerative colitis. Involvement is most pronounced in the transverse colon and proximal descending colon with some involvement also in the rectosigmoid region. No sign of bowel obstruction or perforation. Electronically Signed   By: MNelson ChimesM.D.   On: 12/18/2017 07:14     Medications:  Scheduled: . amphetamine-dextroamphetamine  30 mg Oral Daily  . conjugated estrogens  1  Applicatorful Vaginal Daily  . [START ON 12/20/2017] Estradiol  10 mcg Vaginal Once per day on Mon Thu  . ferrous sulfate  325 mg Oral Q breakfast   Continuous: . ciprofloxacin 400 mg (12/19/17 0954)  . metronidazole 500 mg (12/19/17 0814)   SSQ:[id number] **OR** acetaminophen, albuterol, amphetamine-dextroamphetamine, ibuprofen, LORazepam, ondansetron **OR** ondansetron (ZOFRAN) IV  Assessment/Plan:  Principal Problem:   Acute colitis Active Problems:   Diarrhea   Dehydration   Asthma   Ulcerative colitis confined to rectum (HCC)   SIRS (systemic inflammatory response syndrome) (HCC)    Acute colitis C. difficile was negative.  GI pathogen panel is also negative.  Patient with history of ulcerative proctitis.  However her current symptoms not similar to her previous exacerbations.  There is no mucus production.  No blood in the stool.  Continue with current treatment for now with cipro  and flagyl.  If there is no improvement in the next 24 hours we will have to consider steroids.  ESR 52.  CRP 24.2.  Lactic acid level was normal.  WBC remains elevated.  Hyponatremia and hypokalemia Hyponatremia most likely due to volume loss.  Improved with hydration.  Replace potassium.  Magnesium was normal.  Normocytic anemia No overt bleeding.  Drop in hemoglobin is most likely due to dilution.  History of ulcerative proctitis See above.  Previously was followed by Dr. Wynetta Emery but has not seen him in 5 years.  Whenever she would have an exacerbation her PCP would give her a course of steroids.  DVT Prophylaxis: SCDs    Code Status: Full code Family Communication: Discussed with the patient Disposition Plan: Management as outlined above.    LOS: 1 day   [facility] Pager [phone] 12/19/2017, 2:20 PM  If 7PM-7AM, please contact night-coverage at [email or web address], password [facility]

## 2017-12-20 LAB — BASIC METABOLIC PANEL
ANION GAP: 8 (ref 5–15)
BUN: <5 mg/dL — ABNORMAL LOW (ref 6–20)
CALCIUM: 7.9 mg/dL — AB (ref 8.9–10.3)
CO2: 26 mmol/L (ref 22–32)
Chloride: 97 mmol/L — ABNORMAL LOW (ref 101–111)
Creatinine, Ser: 0.53 mg/dL (ref 0.44–1.00)
Glucose, Bld: 115 mg/dL — ABNORMAL HIGH (ref 65–99)
POTASSIUM: 3.5 mmol/L (ref 3.5–5.1)
Sodium: 131 mmol/L — ABNORMAL LOW (ref 135–145)

## 2017-12-20 LAB — CBC
HCT: 33.4 % — ABNORMAL LOW (ref 36.0–46.0)
Hemoglobin: 11.1 g/dL — ABNORMAL LOW (ref 12.0–15.0)
MCH: 31.8 pg (ref 26.0–34.0)
MCHC: 33.2 g/dL (ref 30.0–36.0)
MCV: 95.7 fL (ref 78.0–100.0)
PLATELETS: 673 10*3/uL — AB (ref 150–400)
RBC: 3.49 MIL/uL — AB (ref 3.87–5.11)
RDW: 14.8 % (ref 11.5–15.5)
WBC: 15.7 10*3/uL — AB (ref 4.0–10.5)

## 2017-12-20 MED ORDER — ZOLPIDEM TARTRATE 5 MG PO TABS: 5.0000 mg | ORAL_TABLET | Freq: Once | ORAL | Status: DC

## 2017-12-20 MED ORDER — PREDNISONE 50 MG PO TABS
50.0000 mg | ORAL_TABLET | Freq: Every day | ORAL | Status: DC
  Administered 2017-12-20: 50 mg via ORAL
  Filled 2017-12-20: qty 1

## 2017-12-20 MED ORDER — PREDNISONE 20 MG PO TABS: ORAL_TABLET | ORAL | 0 refills | Status: DC

## 2017-12-20 MED ORDER — HYOSCYAMINE SULFATE 0.125 MG SL SUBL
0.2500 mg | SUBLINGUAL_TABLET | Freq: Once | SUBLINGUAL | Status: AC
Start: 2017-12-20 — End: 2017-12-20
  Administered 2017-12-20: 0.25 mg via SUBLINGUAL
  Filled 2017-12-20: qty 2

## 2017-12-20 MED ORDER — METRONIDAZOLE 500 MG PO TABS: 500.0000 mg | ORAL_TABLET | Freq: Three times a day (TID) | ORAL | 0 refills | Status: AC

## 2017-12-20 MED ORDER — LOPERAMIDE HCL 2 MG PO CAPS: 2.0000 mg | ORAL_CAPSULE | Freq: Three times a day (TID) | ORAL | 0 refills | Status: DC | PRN

## 2017-12-20 MED ORDER — METRONIDAZOLE 500 MG PO TABS: 500.0000 mg | ORAL_TABLET | Freq: Three times a day (TID) | ORAL | Status: DC

## 2017-12-20 MED ORDER — POTASSIUM CHLORIDE CRYS ER 20 MEQ PO TBCR
40.0000 meq | EXTENDED_RELEASE_TABLET | Freq: Once | ORAL | Status: AC
  Administered 2017-12-20: 40 meq via ORAL
  Filled 2017-12-20: qty 2

## 2017-12-20 MED ORDER — CIPROFLOXACIN HCL 500 MG PO TABS
500.0000 mg | ORAL_TABLET | Freq: Two times a day (BID) | ORAL | Status: DC
  Administered 2017-12-20: 500 mg via ORAL
  Filled 2017-12-20: qty 1

## 2017-12-20 MED ORDER — CIPROFLOXACIN HCL 500 MG PO TABS: 500.0000 mg | ORAL_TABLET | Freq: Two times a day (BID) | ORAL | 0 refills | Status: AC

## 2017-12-20 MED ORDER — LOPERAMIDE HCL 2 MG PO CAPS
2.0000 mg | ORAL_CAPSULE | Freq: Once | ORAL | Status: AC
  Administered 2017-12-20: 2 mg via ORAL
  Filled 2017-12-20: qty 1

## 2017-12-20 NOTE — Discharge Instructions (Signed)
Colitis Colitis is inflammation of the colon. Colitis may last a short time (acute) or it may last a long time (chronic). What are the causes? This condition may be caused by:  Viruses.  Bacteria.  Reactions to medicine.  Certain autoimmune diseases, such as Crohn disease or ulcerative colitis.  What are the signs or symptoms? Symptoms of this condition include:  Diarrhea.  Passing bloody or tarry stool.  Pain.  Fever.  Vomiting.  Tiredness (fatigue).  Weight loss.  Bloating.  Sudden increase in abdominal pain.  Having fewer bowel movements than usual.  How is this diagnosed? This condition is diagnosed with a stool test or a blood test. You may also have other tests, including X-rays, a CT scan, or a colonoscopy. How is this treated? Treatment may include:  Resting the bowel. This involves not eating or drinking for a period of time.  Fluids that are given through an IV tube.  Medicine for pain and diarrhea.  Antibiotic medicines.  Cortisone medicines.  Surgery.  Follow these instructions at home: Eating and drinking  Follow instructions from your health care provider about eating or drinking restrictions.  Drink enough fluid to keep your urine clear or pale yellow.  Work with a dietitian to determine which foods cause your condition to flare up.  Avoid foods that cause flare-ups.  Eat a well-balanced diet. Medicines  Take over-the-counter and prescription medicines only as told by your health care provider.  If you were prescribed an antibiotic medicine, take it as told by your health care provider. Do not stop taking the antibiotic even if you start to feel better. General instructions  Keep all follow-up visits as told by your health care provider. This is important. Contact a health care provider if:  Your symptoms do not go away.  You develop new symptoms. Get help right away if:  You have a fever that does not go away with  treatment.  You develop chills.  You have extreme weakness, fainting, or dehydration.  You have repeated vomiting.  You develop severe pain in your abdomen.  You pass bloody or tarry stool. This information is not intended to replace advice given to you by your health care provider. Make sure you discuss any questions you have with your health care provider. Document Released: 11/09/2004 Document Revised: 03/09/2016 Document Reviewed: 01/25/2015 Elsevier Interactive Patient Education  2018 [employer] Inc[employer]  

## 2017-12-20 NOTE — Discharge Summary (Signed)
[facility]  Physician Discharge Summary   Patient ID: Anita Rice MRN: [id number] DOB/AGE: Aug 14, 1956 62 y.o.  Admit date: 12/18/2017 Discharge date: 12/20/2017  PCP: [name], PA-C  DISCHARGE DIAGNOSES:  Principal Problem:   Acute colitis Active Problems:   Diarrhea   Dehydration   Asthma   Ulcerative colitis confined to rectum ([place])   SIRS (systemic inflammatory response syndrome) (H[facility])   RECOMMENDATIONS FOR OUTPATIENT FOLLOW UP: 1. Patient has to follow-up with PCP within 1 week. 2. Consider referral to gastroenterology.   DISCHARGE CONDITION: fair  Diet recommendation: Low fiber diet  Filed Weights   12/17/17 1900 12/19/17 0041  Weight: 49 kg (108 lb) 51 kg (112 lb 7 oz)    INITIAL HISTORY: 62 year old Caucasian female with a past medical history of ulcerative proctitis who has not followed up with her gastroenterologist, Dr. Wynetta Emery with [facility], in the last 5 years.  Presented with watery diarrhea which was nonbloody.  Imaging studies raised concern for acute colitis.  She was hospitalized for further management.   HOSPITAL COURSE:   Acute colitis C. difficile was negative.  GI pathogen panel is also negative.  Patient with history of ulcerative proctitis.  However her current symptoms not similar to her previous exacerbations.  There is no mucus production.  No blood in the stool.    Patient was placed on ciprofloxacin and Flagyl.  WBC continues to improve.  However she continues to have frequent small quantity loose stool.  Due to her history of ulcerative proctitis and previous improvement with courses of steroids she will be discharged on short course of steroid and oral antibiotics.  She does feel much better this morning and wants to go home.  She is tolerating her diet.  She will follow-up with your PCP within 1 week.  She was told that if her symptoms get worse to seek attention immediately.  ESR 52.  CRP 24.2.  Lactic acid level was  normal.    Hyponatremia and hypokalemia Hyponatremia most likely due to volume loss.  Improved with hydration.  Replaced potassium.  Magnesium was normal.  Normocytic anemia No overt bleeding.  Drop in hemoglobin is most likely due to dilution.  History of ulcerative proctitis See above.  Previously was followed by Dr. Wynetta Emery but has not seen him in 5 years.  Whenever she would have an exacerbation her PCP would give her a course of steroids.  Would benefit from being referred to gastroenterology as Dr. Wynetta Emery may have retired.  Patient will pursue this with her primary care provider.  Overall improved and stable.  Patient really wants to go home today.  Okay for discharge.   PERTINENT LABS:  The results of significant diagnostics from this hospitalization (including imaging, microbiology, ancillary and laboratory) are listed below for reference.    Microbiology: Recent Results (from the past 240 hour(s))  Gastrointestinal Panel by PCR , Stool     Status: None   Collection Time: 12/18/17  3:32 AM  Result Value Ref Range Status   Campylobacter species NOT DETECTED NOT DETECTED Final   Plesimonas shigelloides NOT DETECTED NOT DETECTED Final   Salmonella species NOT DETECTED NOT DETECTED Final   Yersinia enterocolitica NOT DETECTED NOT DETECTED Final   Vibrio species NOT DETECTED NOT DETECTED Final   Vibrio cholerae NOT DETECTED NOT DETECTED Final   Enteroaggregative E coli (EAEC) NOT DETECTED NOT DETECTED Final   Enteropathogenic E coli (EPEC) NOT DETECTED NOT DETECTED Final   Enterotoxigenic E coli (  ETEC) NOT DETECTED NOT DETECTED Final   Shiga like toxin producing E coli (STEC) NOT DETECTED NOT DETECTED Final   Shigella/Enteroinvasive E coli (EIEC) NOT DETECTED NOT DETECTED Final   Cryptosporidium NOT DETECTED NOT DETECTED Final   Cyclospora cayetanensis NOT DETECTED NOT DETECTED Final   Entamoeba histolytica NOT DETECTED NOT DETECTED Final   Giardia lamblia NOT DETECTED  NOT DETECTED Final   Adenovirus F40/41 NOT DETECTED NOT DETECTED Final   Astrovirus NOT DETECTED NOT DETECTED Final   Norovirus GI/GII NOT DETECTED NOT DETECTED Final   Rotavirus A NOT DETECTED NOT DETECTED Final   Sapovirus (I, II, IV, and V) NOT DETECTED NOT DETECTED Final    Comment: Performed at [facility], [address] [place], [place] [address]  C difficile quick scan w PCR reflex     Status: None   Collection Time: 12/18/17  3:32 AM  Result Value Ref Range Status   C Diff antigen NEGATIVE NEGATIVE Final   C Diff toxin NEGATIVE NEGATIVE Final   C Diff interpretation No C. difficile detected.  Final    Comment: Performed at [facility], [place] [address] [place], [place] [address]  Blood culture (routine x 2)     Status: None (Preliminary result)   Collection Time: 12/18/17  4:25 AM  Result Value Ref Range Status   Specimen Description   Final    BLOOD LEFT ANTECUBITAL Performed at [facility] [address], [place], [place] [address]    Special Requests   Final    IN PEDIATRIC BOTTLE Blood Culture adequate volume Performed at [facility] [address], [place], [place] [address]    Culture   Final    NO GROWTH 2 DAYS Performed at [facility] [address] [place], [place] [address]    Report Status PENDING  Incomplete  Blood culture (routine x 2)     Status: None (Preliminary result)   Collection Time: 12/18/17  4:25 AM  Result Value Ref Range Status   Specimen Description   Final    BLOOD RIGHT HAND Performed at [facility], [place] [address], [place], [place] [address]    Special Requests   Final    BOTTLES DRAWN AEROBIC AND ANAEROBIC Blood Culture adequate volume Performed at [facility] [address], [place], [place] [address]    Culture   Final    NO GROWTH 2 DAYS Performed at [facility] [address], [place],  [address]    Report  Status PENDING  Incomplete     Labs: Basic Metabolic Panel: Recent Labs  Lab 12/17/17 2045 12/19/17 0347 12/20/17 0324  NA 125* 133* 131*  K 3.4* 3.2* 3.5  CL 88* 99* 97*  CO2 25 26 26   GLUCOSE 136* 122* 115*  BUN 8 6 <5*  CREATININE 0.63 0.55 0.53  CALCIUM 8.4* 7.8* 7.9*  MG  --  2.0  --    Liver Function Tests: Recent Labs  Lab 12/17/17 2045  AST 14*  ALT 12*  ALKPHOS 63  BILITOT 0.8  PROT 6.9  ALBUMIN 2.8*   Recent Labs  Lab 12/17/17 2045  LIPASE 15   CBC: Recent Labs  Lab 12/17/17 1552 12/17/17 2045 12/19/17 0347 12/20/17 0324  WBC 15.0* 17.9* 18.0* 15.7*  HGB 11.9* 12.6 10.6* 11.1*  HCT 35.5* 36.0 31.6* 33.4*  MCV 95.4 93.0 95.8 95.7  PLT  --  587* 587* 673*  IMAGING STUDIES Ct Abdomen Pelvis W Contrast  Result Date: 12/18/2017 CLINICAL DATA:  Watery diarrhea over the last week. Abdominal pain and cramping with fever and elevated white count. History of ulcerative colitis. EXAM: CT ABDOMEN AND PELVIS WITH CONTRAST TECHNIQUE: Multidetector CT imaging of the abdomen and pelvis was performed using the standard protocol following bolus administration of intravenous contrast. CONTRAST:  93m ISOVUE-300 IOPAMIDOL (ISOVUE-300) INJECTION 61% COMPARISON:  Radiography 10/08/2017 FINDINGS: Lower chest: Normal Hepatobiliary: Normal except for a 9 mm cyst at the caudal tip of the right lobe of the liver. Pancreas: Normal Spleen: Normal Adrenals/Urinary Tract: Adrenal glands are normal. Kidneys are normal. Bladder is normal. Stomach/Bowel: Advanced colitis with wall thickening and surrounding edema, most pronounced affecting the transverse and proximal descending colon in the rectosigmoid region. Findings are consistent with the clinical history of ulcerative colitis. No evidence of perforation or abscess. No small bowel abnormality. No sign of bowel obstruction. Vascular/Lymphatic: Minimal aortic atherosclerosis. No aneurysm. IVC is normal. No retroperitoneal adenopathy.  Reproductive: Normal Other: No free fluid or air. Musculoskeletal: Curvature in degenerative changes of the spine. IMPRESSION: Acute colitis consistent with the clinical history of ulcerative colitis. Involvement is most pronounced in the transverse colon and proximal descending colon with some involvement also in the rectosigmoid region. No sign of bowel obstruction or perforation. Electronically Signed   By: MNelson ChimesM.D.   On: 12/18/2017 07:14    DISCHARGE EXAMINATION: Vitals:   12/19/17 1459 12/19/17 2243 12/20/17 0500 12/20/17 1238  BP: 134/83 (!) 144/76 133/83 116/81  Pulse: 93 92 98 91  Resp: 20 20 16 16   Temp: 98.6 F (37 C) 98.5 F (36.9 C) 98.2 F (36.8 C) 98.3 F (36.8 C)  TempSrc: Oral Oral Oral Oral  SpO2: 99% 96% 100% 98%  Weight:      Height:       General appearance: alert, cooperative, appears stated age and no distress Resp: clear to auscultation bilaterally Cardio: regular rate and rhythm, S1, S2 normal, no murmur, click, rub or gallop GI: soft, non-tender; bowel sounds normal; no masses,  no organomegaly  DISPOSITION: Home  Discharge Instructions    Call MD for:  difficulty breathing, headache or visual disturbances   Complete by:  As directed    Call MD for:  extreme fatigue   Complete by:  As directed    Call MD for:  persistant dizziness or light-headedness   Complete by:  As directed    Call MD for:  persistant nausea and vomiting   Complete by:  As directed    Call MD for:  severe uncontrolled pain   Complete by:  As directed    Call MD for:  temperature >100.4   Complete by:  As directed    Discharge instructions   Complete by:  As directed    Please be sure to follow-up with your primary care provider within 1 week.  You will benefit from referral to a gastroenterologist for your history of ulcerative proctitis.  Please discuss this with your primary care provider.  Seek attention immediately if your symptoms get worse.  Low fiber diet.  You  were cared for by a hospitalist during your hospital stay. If you have any questions about your discharge medications or the care you received while you were in the hospital after you are discharged, you can call the unit and asked to speak with the hospitalist on call if the hospitalist that took care of you is not available. Once you  are discharged, your primary care physician will handle any further medical issues. Please note that NO REFILLS for any discharge medications will be authorized once you are discharged, as it is imperative that you return to your primary care physician (or establish a relationship with a primary care physician if you do not have one) for your aftercare needs so that they can reassess your need for medications and monitor your lab values. If you do not have a primary care physician, you can call [phone] for a physician referral.   Increase activity slowly   Complete by:  As directed         Allergies as of 12/20/2017      Reactions   Adderall Xr [amphetamine-dextroamphet Er] Diarrhea   Benadryl [diphenhydramine] Itching   Neosporin [neomycin-bacitracin Zn-polymyx] Rash      Medication List    STOP taking these medications   atomoxetine 40 MG capsule Commonly known as:  STRATTERA   diphenoxylate-atropine 2.5-0.025 MG tablet Commonly known as:  LOMOTIL   ibuprofen 200 MG tablet Commonly known as:  ADVIL,MOTRIN   lisdexamfetamine 10 MG capsule Commonly known as:  VYVANSE   lisdexamfetamine 30 MG capsule Commonly known as:  VYVANSE   oseltamivir 75 MG capsule Commonly known as:  TAMIFLU     TAKE these medications   acetaminophen 500 MG tablet Commonly known as:  TYLENOL Take 500 mg by mouth every 6 (six) hours as needed for mild pain.   albuterol 108 (90 Base) MCG/ACT inhaler Commonly known as:  PROVENTIL HFA;VENTOLIN HFA Inhale 2 puffs into the lungs every 6 (six) hours as needed for wheezing or shortness of breath.     amphetamine-dextroamphetamine 10 MG tablet Commonly known as:  ADDERALL Take 1 tablet (10 mg total) by mouth daily. You may take in addition to Adderall 70m PRN What changed:  Another medication with the same name was removed. Continue taking this medication, and follow the directions you see here.   amphetamine-dextroamphetamine 10 MG tablet Commonly known as:  ADDERALL Take 1 tablet (10 mg total) by mouth daily. You may take this in addition to Adderall 329mPRN What changed:  Another medication with the same name was removed. Continue taking this medication, and follow the directions you see here.   amphetamine-dextroamphetamine 30 MG 24 hr capsule Commonly known as:  ADDERALL XR Take 1 capsule (30 mg total) by mouth every morning. What changed:  Another medication with the same name was removed. Continue taking this medication, and follow the directions you see here.   budesonide-formoterol 160-4.5 MCG/ACT inhaler Commonly known as:  SYMBICORT Inhale 2 puffs into the lungs every morning.   CENTRUM SILVER PO Take 1 tablet by mouth every morning.   ciprofloxacin 500 MG tablet Commonly known as:  CIPRO Take 1 tablet (500 mg total) by mouth 2 (two) times daily for 7 days.   conjugated estrogens vaginal cream Commonly known as:  PREMARIN Place 1 Applicatorful vaginally daily.   fexofenadine 180 MG tablet Commonly known as:  ALLEGRA TAKE 1 TABLET BY MOUTH EVERY DAY   fluocinolone 0.01 % cream Commonly known as:  VANOS Apply topically 2 (two) times daily.   fluticasone 50 MCG/ACT nasal spray Commonly known as:  FLONASE USE 2 SPRAYS IN THE NOSE ONCE DAILY AS DIRECTED   Iron 28 MG Tabs Take 1 tablet by mouth every morning.   loperamide 2 MG capsule Commonly known as:  IMODIUM Take 1 capsule (2 mg total) by mouth 3 (three) times daily  as needed for diarrhea or loose stools.   LORazepam 0.5 MG tablet Commonly known as:  ATIVAN Take 1 tablet (0.5 mg total) by mouth 2  (two) times daily as needed for anxiety. What changed:  Another medication with the same name was removed. Continue taking this medication, and follow the directions you see here.   metroNIDAZOLE 500 MG tablet Commonly known as:  FLAGYL Take 1 tablet (500 mg total) by mouth every 8 (eight) hours for 6 days.   predniSONE 20 MG tablet Commonly known as:  DELTASONE Take 3 tablets once daily for 2 days, then take 2 tablets once daily for 3 days, then take 1 tablet once daily for 3 days, then STOP. Start taking on:  12/21/2017   YUVAFEM 10 MCG Tabs vaginal tablet Generic drug:  Estradiol PLACE 1 TABLET (10 MCG TOTAL) VAGINALLY 2 (TWO) TIMES A WEEK.        Follow-up Information    McVey, Gelene Mink, PA-C. Schedule an appointment as soon as possible for a visit in 1 week(s).   Specialties:  Librarian, academic, Family Medicine Contact information: [place] [address] [phone]           TOTAL DISCHARGE TIME: 55 mins  [facility] Pager [phone]  12/20/2017, 2:58 PM

## 2017-12-21 ENCOUNTER — Encounter: Payer: Self-pay | Admitting: Family Medicine

## 2017-12-23 LAB — CULTURE, BLOOD (ROUTINE X 2)
CULTURE: NO GROWTH
CULTURE: NO GROWTH
SPECIAL REQUESTS: ADEQUATE
SPECIAL REQUESTS: ADEQUATE

## 2017-12-26 ENCOUNTER — Ambulatory Visit (INDEPENDENT_AMBULATORY_CARE_PROVIDER_SITE_OTHER): Payer: BLUE CROSS/BLUE SHIELD | Admitting: Physician Assistant

## 2017-12-26 ENCOUNTER — Encounter: Payer: Self-pay | Admitting: Physician Assistant

## 2017-12-26 ENCOUNTER — Other Ambulatory Visit: Payer: Self-pay

## 2017-12-26 VITALS — BP 118/73 | HR 120 | Temp 98.9°F | Resp 16 | Ht 60.0 in | Wt 104.6 lb

## 2017-12-26 DIAGNOSIS — F988 Other specified behavioral and emotional disorders with onset usually occurring in childhood and adolescence: Secondary | ICD-10-CM

## 2017-12-26 DIAGNOSIS — R197 Diarrhea, unspecified: Secondary | ICD-10-CM | POA: Diagnosis not present

## 2017-12-26 DIAGNOSIS — K51919 Ulcerative colitis, unspecified with unspecified complications: Secondary | ICD-10-CM | POA: Diagnosis not present

## 2017-12-26 DIAGNOSIS — Z09 Encounter for follow-up examination after completed treatment for conditions other than malignant neoplasm: Secondary | ICD-10-CM | POA: Diagnosis not present

## 2017-12-26 LAB — POCT URINALYSIS DIP (MANUAL ENTRY)
Bilirubin, UA: NEGATIVE
Blood, UA: NEGATIVE
Glucose, UA: NEGATIVE mg/dL
Ketones, POC UA: NEGATIVE mg/dL
Leukocytes, UA: NEGATIVE
Nitrite, UA: NEGATIVE
Protein Ur, POC: NEGATIVE mg/dL
Spec Grav, UA: >=1.03 — AB (ref 1.010–1.025)
Urobilinogen, UA: 0.2 U/dL
pH, UA: 7 (ref 5.0–8.0)

## 2017-12-26 LAB — POCT CBC
Granulocyte percent: 83.7 %G — AB (ref 37–80)
HCT, POC: 37.3 % — AB (ref 37.7–47.9)
Hemoglobin: 12.6 g/dL (ref 12.2–16.2)
Lymph, poc: 1.6 (ref 0.6–3.4)
MCH, POC: 32 pg — AB (ref 27–31.2)
MCHC: 33.7 g/dL (ref 31.8–35.4)
MCV: 95 fL (ref 80–97)
MID (cbc): 0.7 (ref 0–0.9)
MPV: 6.1 fL (ref 0–99.8)
POC Granulocyte: 11.4 — AB (ref 2–6.9)
POC LYMPH PERCENT: 11.8 % (ref 10–50)
POC MID %: 5.1 %M (ref 0–12)
Platelet Count, POC: 929 10*3/uL — AB (ref 142–424)
RBC: 3.93 M/uL — AB (ref 4.04–5.48)
RDW, POC: 15.9 %
WBC: 13.7 10*3/uL — AB (ref 4.6–10.2)

## 2017-12-26 MED ORDER — AMPHETAMINE-DEXTROAMPHET ER 30 MG PO CP24: 30.0000 mg | ORAL_CAPSULE | ORAL | 0 refills | Status: DC

## 2017-12-26 MED ORDER — PREDNISONE 20 MG PO TABS: ORAL_TABLET | ORAL | 0 refills | Status: DC

## 2017-12-26 MED ORDER — AMPHETAMINE-DEXTROAMPHETAMINE 10 MG PO TABS: 10.0000 mg | ORAL_TABLET | Freq: Every day | ORAL | 0 refills | Status: DC

## 2017-12-26 NOTE — Patient Instructions (Addendum)
You will receive a phone call to schedule an appointment with GI.   Follow a low fiber diet until otherwise instructed by GI.   Low-Fiber Diet Fiber is found in fruits, vegetables, and whole grains. A low-fiber diet restricts fibrous foods that are not digested in the small intestine. A diet containing about 10-15 grams of fiber per day is considered low fiber. Low-fiber diets may be used to:  Promote healing and rest the bowel during intestinal flare-ups.  Prevent blockage of a partially obstructed or narrowed gastrointestinal tract.  Reduce fecal weight and volume.  Slow the movement of feces.  You may be on a low-fiber diet as a transitional diet following surgery, after an injury (trauma), or because of a short (acute) or lifelong (chronic) illness. Your health care provider will determine the length of time you need to stay on this diet. What do I need to know about a low-fiber diet? Always check the fiber content on the packaging's Nutrition Facts label, especially on foods from the grains list. Ask your dietitian if you have questions about specific foods that are related to your condition, especially if the food is not listed below. In general, a low-fiber food will have less than 2 g of fiber. What foods can I eat? Grains All breads and crackers made with white flour. Sweet rolls, doughnuts, waffles, pancakes, [place] toast, bagels. Pretzels, Melba toast, zwieback. Well-cooked cereals, such as cornmeal, farina, or cream cereals. Dry cereals that do not contain whole grains, fruit, or nuts, such as refined corn, wheat, rice, and oat cereals. Potatoes prepared any way without skins, plain pastas and noodles, refined white rice. Use white flour for baking and making sauces. Use allowed list of grains for casseroles, dumplings, and puddings. Vegetables Strained tomato and vegetable juices. Fresh lettuce, cucumber, spinach. Well-cooked (no skin or pulp) or canned vegetables, such as  asparagus, bean sprouts, beets, carrots, green beans, mushrooms, potatoes, pumpkin, spinach, yellow squash, tomato sauce/puree, turnips, yams, and zucchini. Keep servings limited to  cup. Fruits All fruit juices except prune juice. Cooked or canned fruits without skin and seeds, such as applesauce, apricots, cherries, fruit cocktail, grapefruit, grapes, mandarin oranges, melons, peaches, pears, pineapple, and plums. Fresh fruits without skin, such as apricots, avocados, bananas, melons, pineapple, nectarines, and peaches. Keep servings limited to  cup or 1 piece. Meat and Other Protein Sources Ground or well-cooked tender beef, ham, veal, lamb, pork, or poultry. Eggs, plain cheese. Fish, oysters, shrimp, lobster, and other seafood. Liver, organ meats. Smooth nut butters. Dairy All milk products and alternative dairy substitutes, such as soy, rice, almond, and coconut, not containing added whole nuts, seeds, or added fruit. Beverages Decaf coffee, fruit, and vegetable juices or smoothies (small amounts, with no pulp or skins, and with fruits from allowed list), sports drinks, herbal tea. Condiments Ketchup, mustard, vinegar, cream sauce, cheese sauce, cocoa powder. Spices in moderation, such as allspice, basil, bay leaves, celery powder or leaves, cinnamon, cumin powder, curry powder, ginger, mace, marjoram, onion or garlic powder, oregano, paprika, parsley flakes, ground pepper, rosemary, sage, savory, tarragon, thyme, and turmeric. Sweets and Desserts Plain cakes and cookies, pie made with allowed fruit, pudding, custard, cream pie. Gelatin, fruit, ice, sherbet, frozen ice pops. Ice cream, ice milk without nuts. Plain hard candy, honey, jelly, molasses, syrup, sugar, chocolate syrup, gumdrops, marshmallows. Limit overall sugar intake. Fats and Oil Margarine, butter, cream, mayonnaise, salad oils, plain salad dressings made from allowed foods. Choose healthy fats such as olive oil, canola  oil, and  omega-3 fatty acids (such as found in salmon or tuna) when possible. Other Bouillon, broth, or cream soups made from allowed foods. Any strained soup. Casseroles or mixed dishes made with allowed foods. The items listed above may not be a complete list of recommended foods or beverages. Contact your dietitian for more options. What foods are not recommended? Grains All whole wheat and whole grain breads and crackers. Multigrains, rye, bran seeds, nuts, or coconut. Cereals containing whole grains, multigrains, bran, coconut, nuts, raisins. Cooked or dry oatmeal, steel-cut oats. Coarse wheat cereals, granola. Cereals advertised as high fiber. Potato skins. Whole grain pasta, wild or brown rice. Popcorn. Coconut flour. Bran, buckwheat, corn bread, multigrains, rye, wheat germ. Vegetables Fresh, cooked or canned vegetables, such as artichokes, asparagus, beet greens, broccoli, Brussels sprouts, cabbage, celery, cauliflower, corn, eggplant, kale, legumes or beans, okra, peas, and tomatoes. Avoid large servings of any vegetables, especially raw vegetables. Fruits Fresh fruits, such as apples with or without skin, berries, cherries, figs, grapes, grapefruit, guavas, kiwis, mangoes, oranges, papayas, pears, persimmons, pineapple, and pomegranate. Prune juice and juices with pulp, stewed or dried prunes. Dried fruits, dates, raisins. Fruit seeds or skins. Avoid large servings of all fresh fruits. Meats and Other Protein Sources Tough, fibrous meats with gristle. Chunky nut butter. Cheese made with seeds, nuts, or other foods not recommended. Nuts, seeds, legumes (beans, including baked beans), dried peas, beans, lentils. Dairy Yogurt or cheese that contains nuts, seeds, or added fruit. Beverages Fruit juices with high pulp, prune juice. Caffeinated coffee and teas. Condiments Coconut, maple syrup, pickles, olives. Sweets and Desserts Desserts, cookies, or candies that contain nuts or coconut, chunky  peanut butter, dried fruits. Jams, preserves with seeds, marmalade. Large amounts of sugar and sweets. Any other dessert made with fruits from the not recommended list. Other Soups made from vegetables that are not recommended or that contain other foods not recommended. The items listed above may not be a complete list of foods and beverages to avoid. Contact your dietitian for more information. This information is not intended to replace advice given to you by your health care provider. Make sure you discuss any questions you have with your health care provider. Document Released: 03/24/2002 Document Revised: 03/09/2016 Document Reviewed: 08/25/2013 Elsevier Interactive Patient Education  2017 [place] you for coming in today. I hope you feel we met your needs.  Feel free to call PCP if you have any questions or further requests.  Please consider signing up for MyChart if you do not already have it, as this is a great way to communicate with me.  Best,  Whitney McVey, PA-C  IF you received an x-ray today, you will receive an invoice from [facility]. Please contact [facility] at [phone] with questions or concerns regarding your invoice.   IF you received labwork today, you will receive an invoice from [facility]. Please contact [facility] at [phone] with questions or concerns regarding your invoice.   Our billing staff will not be able to assist you with questions regarding bills from these companies.  You will be contacted with the lab results as soon as they are available. The fastest way to get your results is to activate your My Chart account. Instructions are located on the last page of this paperwork. If you have not heard from [place] regarding the results in 2 weeks, please contact this office.

## 2017-12-26 NOTE — Progress Notes (Signed)
Anita Rice  MRN: [id number] DOB: 1956-10-05  PCP: [name], PA-C  Subjective:  Pt is a 62 year old female who presents to clinic for hospital f/u s/p colitis flare. She was here 3/5 and asked to go to the emergency department due to worsening abdominal pain, diarrhea symptoms, fever, tachycardia.  She was admitted for acute colitis. CT abdomen showed involvement in the transverse colon proximal descending colon also with the rectosigmoid region.  She received IV Cipro and Flagyl and IV fluids. C. difficile negative. GI pathogen panel also negative.  She was discharged on PO steroids and antibiotics. She is taking Cipro 566m bid and flagyl. No referral made for GI.  She feels 75% better. Still having 3 episodes of diarrhea daily, "I can tell the stools are trying to form". She was never instructed at her hospital discharge to eat a low fiber diet - she is not doing this. Denies abdominal pain, fever, chills, nausea, HA.   She is very stressed at this time as she is planning a big event at work.  She would like refill of Adderall 312mXR.   Review of Systems  Constitutional: Negative for chills, diaphoresis, fatigue and fever.  Gastrointestinal: Positive for diarrhea. Negative for abdominal pain.    Patient Active Problem List   Diagnosis Date Noted  . Diarrhea 12/18/2017  . SIRS (systemic inflammatory response syndrome) (HCOasis03/02/2018  . Acute colitis   . Dehydration   . Asthma   . Ulcerative colitis confined to rectum ([facility] [place]  . Macrocytosis without anemia 06/01/2014  . Thrombocytosis (HCF[facility]ulk08/17/2015  . Alcoholic peripheral neuropathy (HCBonita Springs08/17/2015    Current Outpatient Medications on File Prior to Visit  Medication Sig Dispense Refill  . acetaminophen (TYLENOL) 500 MG tablet Take 500 mg by mouth every 6 (six) hours as needed for mild pain.     . Marland Kitchenlbuterol (PROVENTIL HFA;VENTOLIN HFA) 108 (90 Base) MCG/ACT inhaler Inhale 2 puffs into the lungs every 6  (six) hours as needed for wheezing or shortness of breath. 1 Inhaler 3  . amphetamine-dextroamphetamine (ADDERALL XR) 30 MG 24 hr capsule Take 1 capsule (30 mg total) by mouth every morning. 30 capsule 0  . budesonide-formoterol (SYMBICORT) 160-4.5 MCG/ACT inhaler Inhale 2 puffs into the lungs every morning. 1 Inhaler 6  . ciprofloxacin (CIPRO) 500 MG tablet Take 1 tablet (500 mg total) by mouth 2 (two) times daily for 7 days. 14 tablet 0  . conjugated estrogens (PREMARIN) vaginal cream Place 1 Applicatorful vaginally daily. 42.5 g 12  . Ferrous Sulfate (IRON) 28 MG TABS Take 1 tablet by mouth every morning.     . fexofenadine (ALLEGRA) 180 MG tablet TAKE 1 TABLET BY MOUTH EVERY DAY 30 tablet 0  . fluocinolone (VANOS) 0.01 % cream Apply topically 2 (two) times daily. 30 g 0  . fluticasone (FLONASE) 50 MCG/ACT nasal spray USE 2 SPRAYS IN THE NOSE ONCE DAILY AS DIRECTED 48 g 4  . loperamide (IMODIUM) 2 MG capsule Take 1 capsule (2 mg total) by mouth 3 (three) times daily as needed for diarrhea or loose stools. 30 capsule 0  . LORazepam (ATIVAN) 0.5 MG tablet Take 1 tablet (0.5 mg total) by mouth 2 (two) times daily as needed for anxiety. 60 tablet 0  . metroNIDAZOLE (FLAGYL) 500 MG tablet Take 1 tablet (500 mg total) by mouth every 8 (eight) hours for 6 days. 18 tablet 0  . Multiple Vitamins-Minerals (CENTRUM SILVER PO) Take 1 tablet by mouth  every morning.     . predniSONE (DELTASONE) 20 MG tablet Take 3 tablets once daily for 2 days, then take 2 tablets once daily for 3 days, then take 1 tablet once daily for 3 days, then STOP. 15 tablet 0  . YUVAFEM 10 MCG TABS vaginal tablet PLACE 1 TABLET (10 MCG TOTAL) VAGINALLY 2 (TWO) TIMES A WEEK. 8 tablet 1  . amphetamine-dextroamphetamine (ADDERALL) 10 MG tablet Take 1 tablet (10 mg total) by mouth daily. You may take this in addition to Adderall 58m PRN 30 tablet 0  . amphetamine-dextroamphetamine (ADDERALL) 10 MG tablet Take 1 tablet (10 mg total) by  mouth daily. You may take in addition to Adderall 348mPRN 30 tablet 0   No current facility-administered medications on file prior to visit.     Allergies  Allergen Reactions  . Adderall Xr [Amphetamine-Dextroamphet Er] Diarrhea  . Benadryl [Diphenhydramine] Itching  . Neosporin [Neomycin-Bacitracin Zn-Polymyx] Rash     Objective:  BP 118/73   Temp 98.9 F (37.2 C) (Oral)   Resp 16   Ht 5' (1.524 m)   Wt 104 lb 9.6 oz (47.4 kg)   SpO2 97%   BMI 20.43 kg/m   Physical Exam  Constitutional: She is oriented to person, place, and time and well-developed, well-nourished, and in no distress. No distress.  Cardiovascular: Normal rate, regular rhythm and normal heart sounds.  Abdominal: Soft. Bowel sounds are normal. There is no tenderness.  Neurological: She is alert and oriented to person, place, and time. GCS score is 15.  Skin: Skin is warm and dry.  Psychiatric: Mood, memory, affect and judgment normal.  Vitals reviewed.  Results for orders placed or performed in visit on 12/26/17  POCT CBC  Result Value Ref Range   WBC 13.7 (A) 4.6 - 10.2 K/uL   Lymph, poc 1.6 0.6 - 3.4   POC LYMPH PERCENT 11.8 10 - 50 %L   MID (cbc) 0.7 0 - 0.9   POC MID % 5.1 0 - 12 %M   POC Granulocyte 11.4 (A) 2 - 6.9   Granulocyte percent 83.7 (A) 37 - 80 %G   RBC 3.93 (A) 4.04 - 5.48 M/uL   Hemoglobin 12.6 12.2 - 16.2 g/dL   HCT, POC 37.3 (A) 37.7 - 47.9 %   MCV 95.0 80 - 97 fL   MCH, POC 32.0 (A) 27 - 31.2 pg   MCHC 33.7 31.8 - 35.4 g/dL   RDW, POC 15.9 %   Platelet Count, POC 929 (A) 142 - 424 K/uL   MPV 6.1 0 - 99.8 fL  POCT urinalysis dipstick  Result Value Ref Range   Color, UA yellow yellow   Clarity, UA clear clear   Glucose, UA negative negative mg/dL   Bilirubin, UA negative negative   Ketones, POC UA negative negative mg/dL   Spec Grav, UA >=1.030 (A) 1.010 - 1.025   Blood, UA negative negative   pH, UA 7.0 5.0 - 8.0   Protein Ur, POC negative negative mg/dL   Urobilinogen,  UA 0.2 0.2 or 1.0 E.U./dL   Nitrite, UA Negative Negative   Leukocytes, UA Negative Negative    Assessment and Plan :  1. Ulcerative colitis with complication, unspecified location (HCSc[facility] [place]2. Diarrhea 3. Encounter for examination following treatment at hospital - Sedimentation Rate - C-reactive protein - predniSONE (DELTASONE) 20 MG tablet; Take 3 pills x 5 days; 2 pills x 5 days; 1 pill x 5 days  Dispense: 30 tablet; Refill: 0 -  POCT CBC - POCT urinalysis dipstick - Ambulatory referral to Gastroenterology - Pt presents for hospital f/u s/p acute colitis flare. CT abdomen showed involvement in the transverse colon proximal descending colon also with the rectosigmoid region.  She received IV Cipro and Flagyl and IV fluids. C. difficile negative. GI pathogen panel also negative. She was discharged on PO steroids and antibiotics. She is taking Cipro 533m bid and flagyl. She feels 75% better. WBC count is still elevated, however is improving. RBC count is improving. Advised low fiber diet.  Plan to refer to GI for evaluation. She agrees with plan.   4. Attention deficit disorder, unspecified hyperactivity presence - amphetamine-dextroamphetamine (ADDERALL) 10 MG tablet; Take 1 tablet (10 mg total) by mouth daily. You may take this in addition to Adderall 319mPRN  Dispense: 30 tablet; Refill: 0 - amphetamine-dextroamphetamine (ADDERALL XR) 30 MG 24 hr capsule; Take 1 capsule (30 mg total) by mouth every morning.  Dispense: 30 capsule; Refill: 0  . WhMercer PodPA-C  [facility] at [facility]/13/2019 12:17 PM

## 2017-12-27 LAB — SEDIMENTATION RATE: Sed Rate: 45 mm/hr — ABNORMAL HIGH (ref 0–40)

## 2017-12-27 LAB — C-REACTIVE PROTEIN: CRP: 27.6 mg/L — ABNORMAL HIGH (ref 0.0–4.9)

## 2017-12-28 ENCOUNTER — Encounter: Payer: Self-pay | Admitting: Physician Assistant

## 2017-12-28 ENCOUNTER — Telehealth: Payer: Self-pay

## 2017-12-28 NOTE — Telephone Encounter (Signed)
PEC calling to advise Chanda from [facility] advises she received referral info on pt an advises pt can't not be seen by their office or any [facility] as she has been dismissed from them all so McVey will have to refer her to another GI practice.  Advised message will be given.  McVey made aware and wants pt referral sent to another willing office. Dgaddy, CMA

## 2017-12-31 ENCOUNTER — Encounter: Payer: Self-pay | Admitting: Physician Assistant

## 2017-12-31 ENCOUNTER — Other Ambulatory Visit: Payer: Self-pay | Admitting: Physician Assistant

## 2017-12-31 DIAGNOSIS — B370 Candidal stomatitis: Secondary | ICD-10-CM

## 2017-12-31 MED ORDER — CLOTRIMAZOLE 10 MG MT TROC: 10.0000 mg | Freq: Every day | OROMUCOSAL | 0 refills | Status: AC

## 2018-01-01 ENCOUNTER — Telehealth: Payer: Self-pay | Admitting: Physician Assistant

## 2018-01-01 NOTE — Telephone Encounter (Signed)
Called Anita Rice and spoke with Anita Rice concerning Anita Rice's referral. They had left [place] a message stating Anita Rice was dismissed, however, the Anita Rice contacted them as well and was able to get reinstated with them. They told Anita Rice they did not have referral from [place]. They did receive referral and Anita Rice stated that they were just waiting on the Anita Rice's account to be reinstated with billing and then they would contact Anita Rice for an appt. Anita Rice also stated that Anita Rice said Anita Rice had seen a previous GI doctor so the Anita Rice would need to get those records before the appt. I spoke with Anita Rice to advise Anita Rice on process of referral. Anita Rice stated the previous GI doctor Anita Rice saw was [name] and he is with [facility] so they should still have his records. Anita Rice said Anita Rice would contact [facility] to let them know they should have Anita Rice records. I told Anita Rice to call back and ask for [facility] or referrals if there are any issues.

## 2018-01-03 ENCOUNTER — Other Ambulatory Visit: Payer: Self-pay | Admitting: Physician Assistant

## 2018-01-09 DIAGNOSIS — K5190 Ulcerative colitis, unspecified, without complications: Secondary | ICD-10-CM | POA: Diagnosis not present

## 2018-01-21 ENCOUNTER — Other Ambulatory Visit: Payer: Self-pay | Admitting: Physician Assistant

## 2018-01-21 ENCOUNTER — Encounter: Payer: Self-pay | Admitting: Physician Assistant

## 2018-01-21 DIAGNOSIS — K529 Noninfective gastroenteritis and colitis, unspecified: Secondary | ICD-10-CM | POA: Diagnosis not present

## 2018-01-21 DIAGNOSIS — K5289 Other specified noninfective gastroenteritis and colitis: Secondary | ICD-10-CM | POA: Diagnosis not present

## 2018-01-21 DIAGNOSIS — K633 Ulcer of intestine: Secondary | ICD-10-CM | POA: Diagnosis not present

## 2018-01-21 DIAGNOSIS — K5190 Ulcerative colitis, unspecified, without complications: Secondary | ICD-10-CM | POA: Diagnosis not present

## 2018-01-21 DIAGNOSIS — K5140 Inflammatory polyps of colon without complications: Secondary | ICD-10-CM | POA: Diagnosis not present

## 2018-01-24 DIAGNOSIS — K5140 Inflammatory polyps of colon without complications: Secondary | ICD-10-CM | POA: Diagnosis not present

## 2018-01-24 DIAGNOSIS — K5289 Other specified noninfective gastroenteritis and colitis: Secondary | ICD-10-CM | POA: Diagnosis not present

## 2018-01-26 ENCOUNTER — Other Ambulatory Visit: Payer: Self-pay | Admitting: Physician Assistant

## 2018-01-26 DIAGNOSIS — F411 Generalized anxiety disorder: Secondary | ICD-10-CM

## 2018-01-28 NOTE — Telephone Encounter (Signed)
Refill  Request  Lorazepam  LOV 12/26/2017  Pharmacy on file

## 2018-02-06 ENCOUNTER — Telehealth: Payer: Self-pay | Admitting: Physician Assistant

## 2018-02-06 NOTE — Telephone Encounter (Signed)
Copied from [place] [id number]. Topic: Quick Communication - Rx Refill/Question >> Feb 06, 2018  3:41 PM Ether Griffins B wrote: Medication: symbicort Has the patient contacted their pharmacy? Yes.   (Agent: If no, request that the patient contact the pharmacy for the refill.) Preferred Pharmacy (with phone number or street name): [id number] [place] Please be advised that RX refills may take up to 3 business days. We ask that you follow-up with your pharmacy.

## 2018-02-18 DIAGNOSIS — K5100 Ulcerative (chronic) pancolitis without complications: Secondary | ICD-10-CM | POA: Diagnosis not present

## 2018-02-22 ENCOUNTER — Other Ambulatory Visit: Payer: Self-pay | Admitting: Physician Assistant

## 2018-02-22 NOTE — Telephone Encounter (Signed)
fexofenadine refill Last OV: 12/16/17 Last Refill:01/22/18 #30 tab no RF [facility] [address] Whitney McVey PA-C

## 2018-03-10 ENCOUNTER — Encounter: Payer: Self-pay | Admitting: Physician Assistant

## 2018-03-10 DIAGNOSIS — M654 Radial styloid tenosynovitis [de Quervain]: Secondary | ICD-10-CM | POA: Insufficient documentation

## 2018-03-25 ENCOUNTER — Other Ambulatory Visit: Payer: Self-pay | Admitting: Physician Assistant

## 2018-03-25 DIAGNOSIS — J45909 Unspecified asthma, uncomplicated: Secondary | ICD-10-CM

## 2018-03-25 NOTE — Telephone Encounter (Signed)
Copied from [place] [id number]. Topic: Quick Communication - Rx Refill/Question >> Mar 25, 2018 10:18 AM Boyd Kerbs wrote: Medication:  budesonide-formoterol (SYMBICORT) 160-4.5 MCG/ACT inhaler  Has the patient contacted their pharmacy? Yes.   (Agent: If no, request that the patient contact the pharmacy for the refill.) (Agent: If yes, when and what did the pharmacy advise?)  Preferred Pharmacy (with phone number or street name):   [id number]: [phone]: [phone]   Agent: Please be advised that RX refills may take up to 3 business days. We ask that you follow-up with your pharmacy.

## 2018-03-26 NOTE — Telephone Encounter (Signed)
Rx refill request: Symbicort inhaler 160-4.5 mcg/act   Last filled: 11/27/16 3 RF  LOV: 12/26/17  PCP: [place]: verified

## 2018-03-27 ENCOUNTER — Other Ambulatory Visit: Payer: Self-pay | Admitting: *Deleted

## 2018-03-27 ENCOUNTER — Ambulatory Visit: Payer: BLUE CROSS/BLUE SHIELD | Admitting: Physician Assistant

## 2018-03-27 MED ORDER — ALBUTEROL SULFATE HFA 108 (90 BASE) MCG/ACT IN AERS: 2.0000 | INHALATION_SPRAY | Freq: Four times a day (QID) | RESPIRATORY_TRACT | 3 refills | Status: DC | PRN

## 2018-03-27 MED ORDER — BUDESONIDE-FORMOTEROL FUMARATE 160-4.5 MCG/ACT IN AERO: 2.0000 | INHALATION_SPRAY | Freq: Every morning | RESPIRATORY_TRACT | 6 refills | Status: DC

## 2018-03-27 NOTE — Telephone Encounter (Signed)
[facility] calling to f/u on request for symbicort. Please advise.

## 2018-03-29 ENCOUNTER — Ambulatory Visit (INDEPENDENT_AMBULATORY_CARE_PROVIDER_SITE_OTHER): Payer: BLUE CROSS/BLUE SHIELD | Admitting: Physician Assistant

## 2018-03-29 ENCOUNTER — Encounter: Payer: Self-pay | Admitting: Physician Assistant

## 2018-03-29 VITALS — BP 97/70 | HR 127 | Temp 97.5°F | Resp 17 | Ht 59.5 in | Wt 111.0 lb

## 2018-03-29 DIAGNOSIS — J302 Other seasonal allergic rhinitis: Secondary | ICD-10-CM

## 2018-03-29 DIAGNOSIS — R0981 Nasal congestion: Secondary | ICD-10-CM | POA: Diagnosis not present

## 2018-03-29 DIAGNOSIS — N952 Postmenopausal atrophic vaginitis: Secondary | ICD-10-CM

## 2018-03-29 DIAGNOSIS — J45909 Unspecified asthma, uncomplicated: Secondary | ICD-10-CM

## 2018-03-29 DIAGNOSIS — N898 Other specified noninflammatory disorders of vagina: Secondary | ICD-10-CM | POA: Diagnosis not present

## 2018-03-29 DIAGNOSIS — F988 Other specified behavioral and emotional disorders with onset usually occurring in childhood and adolescence: Secondary | ICD-10-CM

## 2018-03-29 DIAGNOSIS — F411 Generalized anxiety disorder: Secondary | ICD-10-CM

## 2018-03-29 MED ORDER — FEXOFENADINE HCL 180 MG PO TABS: 180.0000 mg | ORAL_TABLET | Freq: Every day | ORAL | 11 refills | Status: DC

## 2018-03-29 MED ORDER — AMPHETAMINE-DEXTROAMPHETAMINE 10 MG PO TABS: 10.0000 mg | ORAL_TABLET | Freq: Every day | ORAL | 0 refills | Status: DC

## 2018-03-29 MED ORDER — FLUTICASONE PROPIONATE 50 MCG/ACT NA SUSP: NASAL | 5 refills | Status: DC

## 2018-03-29 MED ORDER — AMPHETAMINE-DEXTROAMPHET ER 30 MG PO CP24: 30.0000 mg | ORAL_CAPSULE | Freq: Every day | ORAL | 0 refills | Status: DC

## 2018-03-29 MED ORDER — ESTROGENS, CONJUGATED 0.625 MG/GM VA CREA: 1.0000 | TOPICAL_CREAM | Freq: Every day | VAGINAL | 12 refills | Status: DC

## 2018-03-29 MED ORDER — AMPHETAMINE-DEXTROAMPHET ER 30 MG PO CP24: 30.0000 mg | ORAL_CAPSULE | ORAL | 0 refills | Status: DC

## 2018-03-29 MED ORDER — LORAZEPAM 0.5 MG PO TABS: 0.5000 mg | ORAL_TABLET | Freq: Two times a day (BID) | ORAL | 2 refills | Status: DC | PRN

## 2018-03-29 MED ORDER — FLUOCINOLONE ACETONIDE 0.01 % EX CREA: TOPICAL_CREAM | Freq: Two times a day (BID) | CUTANEOUS | 0 refills | Status: DC

## 2018-03-29 MED ORDER — ALBUTEROL SULFATE 108 (90 BASE) MCG/ACT IN AEPB: 1.0000 | INHALATION_SPRAY | RESPIRATORY_TRACT | 5 refills | Status: DC | PRN

## 2018-03-29 MED ORDER — ESTRADIOL 10 MCG VA TABS: 10.0000 ug | ORAL_TABLET | VAGINAL | 3 refills | Status: DC

## 2018-03-29 NOTE — Progress Notes (Signed)
Anita Rice  MRN: [id number] DOB: 1956/05/23  PCP: [name], PA-C  Subjective:  Pt is a 62 year old female who presents to clinic for medication refills. She is feeling well today.   Lorazepam 0.5 mg bid #60 Allegra 144m  flonase  Vanos cream 0.01%  Yuvafem 123m  Premarin vaginal cream Albuterol inhaler  Adderall XR 3036mAdderall 76m70mReview of Systems  Constitutional: Negative for chills, diaphoresis, fatigue and fever.  HENT: Negative for congestion, postnasal drip, rhinorrhea, sinus pressure, sinus pain and sore throat.   Respiratory: Negative for cough, shortness of breath and wheezing.   Psychiatric/Behavioral: Negative for sleep disturbance.    Patient Active Problem List   Diagnosis Date Noted  . Tenosynovitis, de [place] 03/10/2018  . Diarrhea 12/18/2017  . SIRS (systemic inflammatory response syndrome) (HCC)[place]/02/2018  . Acute colitis   . Dehydration   . Asthma   . Ulcerative colitis confined to rectum (HCC)[place]. Macrocytosis without anemia 06/01/2014  . Thrombocytosis (HCC)[place]/17/2015  . Alcoholic peripheral neuropathy (HCC)[place]/17/2015    Current Outpatient Medications on File Prior to Visit  Medication Sig Dispense Refill  . acetaminophen (TYLENOL) 500 MG tablet Take 500 mg by mouth every 6 (six) hours as needed for mild pain.     . alMarland Kitchenuterol (PROVENTIL HFA;VENTOLIN HFA) 108 (90 Base) MCG/ACT inhaler Inhale 2 puffs into the lungs every 6 (six) hours as needed for wheezing or shortness of breath. 1 Inhaler 3  . amphetamine-dextroamphetamine (ADDERALL XR) 30 MG 24 hr capsule Take 1 capsule (30 mg total) by mouth every morning. 30 capsule 0  . budesonide-formoterol (SYMBICORT) 160-4.5 MCG/ACT inhaler Inhale 2 puffs into the lungs every morning. 1 Inhaler 6  . conjugated estrogens (PREMARIN) vaginal cream Place 1 Applicatorful vaginally daily. 42.5 g 12  . Ferrous Sulfate (IRON) 28 MG TABS Take 1 tablet by mouth every morning.     .  fexofenadine (ALLEGRA) 180 MG tablet TAKE 1 TABLET BY MOUTH EVERY DAY 30 tablet 0  . fluocinolone (VANOS) 0.01 % cream Apply topically 2 (two) times daily. 30 g 0  . fluticasone (FLONASE) 50 MCG/ACT nasal spray USE 2 SPRAYS IN THE NOSE ONCE DAILY AS DIRECTED 48 g 4  . loperamide (IMODIUM) 2 MG capsule Take 1 capsule (2 mg total) by mouth 3 (three) times daily as needed for diarrhea or loose stools. 30 capsule 0  . Multiple Vitamins-Minerals (CENTRUM SILVER PO) Take 1 tablet by mouth every morning.     . predniSONE (DELTASONE) 20 MG tablet Take 3 pills x 5 days; 2 pills x 5 days; 1 pill x 5 days 30 tablet 0  . YUVAFEM 10 MCG TABS vaginal tablet PLACE 1 TABLET (10 MCG TOTAL) VAGINALLY 2 (TWO) TIMES A WEEK. 8 tablet 1  . amphetamine-dextroamphetamine (ADDERALL XR) 30 MG 24 hr capsule Take 1 capsule (30 mg total) by mouth every morning. Take with 76mg26m a total of 30mg 40m30 capsule 0  . amphetamine-dextroamphetamine (ADDERALL) 10 MG tablet Take 1 tablet (10 mg total) by mouth daily. You may take in addition to Adderall 30mg P22m0 tablet 0  . amphetamine-dextroamphetamine (ADDERALL) 10 MG tablet Take 1 tablet (10 mg total) by mouth daily. You may take this in addition to Adderall 30mg PR30m tablet 0   No current facility-administered medications on file prior to visit.     Allergies  Allergen Reactions  . Adderall Xr [Amphetamine-Dextroamphet Er] Diarrhea  . Benadryl [  Diphenhydramine] Itching  . Neosporin [Neomycin-Bacitracin Zn-Polymyx] Rash     Objective:  BP 97/70   Pulse (!) 127   Temp (!) 97.5 F (36.4 C) (Oral)   Resp 17   Ht 4' 11.5" (1.511 m)   Wt 111 lb (50.3 kg)   SpO2 98%   BMI 22.04 kg/m   Physical Exam  Constitutional: She is oriented to person, place, and time. No distress.  Cardiovascular: Normal rate, regular rhythm and normal heart sounds.  Neurological: She is alert and oriented to person, place, and time.  Skin: Skin is warm and dry.  Psychiatric: Judgment  normal.  Vitals reviewed.   Assessment and Plan :  1. Attention deficit disorder, unspecified hyperactivity presence - amphetamine-dextroamphetamine (ADDERALL XR) 30 MG 24 hr capsule; Take 1 capsule (30 mg total) by mouth every morning.  Dispense: 30 capsule; Refill: 0 - amphetamine-dextroamphetamine (ADDERALL) 10 MG tablet; Take 1 tablet (10 mg total) by mouth daily. You may take in addition to Adderall 2m PRN  Dispense: 30 tablet; Refill: 0  2. Seasonal allergies - fexofenadine (ALLEGRA) 180 MG tablet; Take 1 tablet (180 mg total) by mouth daily.  Dispense: 30 tablet; Refill: 11  3. Nasal congestion - fluticasone (FLONASE) 50 MCG/ACT nasal spray; USE 2 SPRAYS IN THE NOSE ONCE DAILY AS DIRECTED  Dispense: 48 g; Refill: 5  4. Vaginal dryness - fluocinolone (VANOS) 0.01 % cream; Apply topically 2 (two) times daily.  Dispense: 30 g; Refill: 0 - conjugated estrogens (PREMARIN) vaginal cream; Place 1 Applicatorful vaginally daily.  Dispense: 42.5 g; Refill: 12  5. Vaginal atrophy - Estradiol (YUVAFEM) 10 MCG TABS vaginal tablet; Place 1 tablet (10 mcg total) vaginally 2 (two) times a week.  Dispense: 8 tablet; Refill: 3  6. Uncomplicated asthma, unspecified asthma severity, unspecified whether persistent - Albuterol Sulfate (PROAIR RESPICLICK) 1854(90 Base) MCG/ACT AEPB; Inhale 1 puff into the lungs as needed.  Dispense: 1 each; Refill: 5  7. GAD (generalized anxiety disorder) - LORazepam (ATIVAN) 0.5 MG tablet; Take 1 tablet (0.5 mg total) by mouth 2 (two) times daily as needed for anxiety.  Dispense: 60 tablet; Refill: 2   WMercer Pod PA-C  [facility] at [place]6/14/2019 11:32 AM

## 2018-04-25 DIAGNOSIS — L658 Other specified nonscarring hair loss: Secondary | ICD-10-CM | POA: Diagnosis not present

## 2018-05-07 ENCOUNTER — Other Ambulatory Visit: Payer: Self-pay | Admitting: Gastroenterology

## 2018-05-07 ENCOUNTER — Ambulatory Visit
Admission: RE | Admit: 2018-05-07 | Discharge: 2018-05-07 | Disposition: A | Discharge status: Home / Self Care | Payer: BLUE CROSS/BLUE SHIELD | Source: Ambulatory Visit | Attending: Gastroenterology | Admitting: Gastroenterology

## 2018-05-07 DIAGNOSIS — K51019 Ulcerative (chronic) pancolitis with unspecified complications: Secondary | ICD-10-CM | POA: Diagnosis not present

## 2018-05-07 DIAGNOSIS — K5100 Ulcerative (chronic) pancolitis without complications: Secondary | ICD-10-CM | POA: Diagnosis not present

## 2018-05-07 DIAGNOSIS — J984 Other disorders of lung: Secondary | ICD-10-CM | POA: Diagnosis not present

## 2018-05-10 DIAGNOSIS — K5100 Ulcerative (chronic) pancolitis without complications: Secondary | ICD-10-CM | POA: Diagnosis not present

## 2018-05-16 DIAGNOSIS — L658 Other specified nonscarring hair loss: Secondary | ICD-10-CM | POA: Diagnosis not present

## 2018-07-10 ENCOUNTER — Telehealth: Payer: Self-pay | Admitting: Physician Assistant

## 2018-07-10 NOTE — Telephone Encounter (Signed)
Copied from [place] [id number]. Topic: Quick Communication - Rx Refill/Question >> Jul 10, 2018  2:17 PM Sheppard Coil, [employer] wrote: Medication:  amphetamine-dextroamphetamine (ADDERALL XR) 30 MG 24 hr capsule   Pt states she doesn't want to come in for a visit she just wants this called in  Has the patient contacted their pharmacy? No - controlled (Agent: If no, request that the patient contact the pharmacy for the refill.) (Agent: If yes, when and what did the pharmacy advise?)  Preferred Pharmacy (with phone number or street name): [id number] [phone](Phone) [phone](Fax)  Agent: Please be advised that RX refills may take up to 3 business days. We ask that you follow-up with your pharmacy.

## 2018-07-10 NOTE — Telephone Encounter (Signed)
Adderall 74m refill. Current prescription is expired Last Refill:05/29/18 #30 Last OV: 03/29/18 [facility] [address]

## 2018-07-12 ENCOUNTER — Other Ambulatory Visit: Payer: Self-pay | Admitting: Physician Assistant

## 2018-07-12 DIAGNOSIS — F988 Other specified behavioral and emotional disorders with onset usually occurring in childhood and adolescence: Secondary | ICD-10-CM

## 2018-07-12 MED ORDER — AMPHETAMINE-DEXTROAMPHET ER 30 MG PO CP24: 30.0000 mg | ORAL_CAPSULE | ORAL | 0 refills | Status: DC

## 2018-07-12 NOTE — Telephone Encounter (Signed)
Rx sent 

## 2018-08-17 ENCOUNTER — Ambulatory Visit (INDEPENDENT_AMBULATORY_CARE_PROVIDER_SITE_OTHER): Payer: BLUE CROSS/BLUE SHIELD | Admitting: Family Medicine

## 2018-08-17 ENCOUNTER — Other Ambulatory Visit: Payer: Self-pay | Admitting: Physician Assistant

## 2018-08-17 ENCOUNTER — Other Ambulatory Visit: Payer: Self-pay

## 2018-08-17 ENCOUNTER — Encounter: Payer: Self-pay | Admitting: Physician Assistant

## 2018-08-17 ENCOUNTER — Encounter: Payer: Self-pay | Admitting: Family Medicine

## 2018-08-17 ENCOUNTER — Telehealth: Payer: Self-pay | Admitting: Family Medicine

## 2018-08-17 VITALS — BP 97/65 | HR 87 | Temp 98.7°F | Resp 16 | Ht 59.75 in | Wt 116.0 lb

## 2018-08-17 DIAGNOSIS — F988 Other specified behavioral and emotional disorders with onset usually occurring in childhood and adolescence: Secondary | ICD-10-CM

## 2018-08-17 NOTE — Telephone Encounter (Signed)
Whitney,   Please process refills for patients Adderall and Lorazepam as she is out. I am PRN and offered to refill until Monday, however patient insurance provider will only fill scripts sent over for 1 month.   Thanks  Carroll Sage. Kenton Kingfisher, FNP-C Nurse Practitioner (PRN Staff)  Primary Care at [facility] [address] [place], [place]

## 2018-08-17 NOTE — Progress Notes (Signed)
Patient ID: Anita Rice, female    DOB: 11-Oct-1956, 62 y.o.   MRN: [id number]  PCP: [name], PA-C  Chief Complaint  Patient presents with  . Medication Refill    Adderall and Lorazepam     Subjective:  HPI Anita Rice is a 62 y.o. female presents for medication refills of Adderall and Lorazepam .  Medical refill request:  ADHD  Diagnosed over 16 years. Reports attention and focus are well controlled with current medication regimen. Denies sleeping issues, palpitations, or changes to appetite.She has gained weight since her last visit. Body mass index is 22.84 kg/m.  General Anxiety Disorder  Lorazepam, currently takes one per day. Reports medication well controls with Lorazepam. She is not in counseling. Denies thoughts of suicide, homicidal ideation,or auditory hallucinations.   Social History   Socioeconomic History  . Marital status: Married    Spouse name: Not on file  . Number of children: 2  . Years of education: Not on file  . 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 Financial resource strain: Not on file  . Food insecurity:    Worry: Not on file    Inability: Not on file  . Transportation needs:    Medical: Not on file    Non-medical: Not on file  Tobacco Use  . Smoking status: Former Research scientist (life sciences)  . Smokeless tobacco: Never Used  Substance and Sexual Activity  . Alcohol use: Yes    Comment: 5-7 nights a week   . Drug use: No  . Sexual activity: Not on file  Lifestyle  . Physical activity:    Days per week: Not on file    Minutes per session: Not on file  . Stress: Not on file  Relationships  . Social connections:    Talks on phone: Not on file    Gets together: Not on file    Attends religious service: Not on file    Active member of club or organization: Not on file    Attends meetings of clubs or organizations: Not on file    Relationship  status: Not on file  . Intimate partner violence:    Fear of current or ex partner: Not on file    Emotionally abused: Not on file    Physically abused: Not on file    Forced sexual activity: Not on file  Other Topics Concern  . Not on file  Social History Narrative  . Not on file    Family History  Problem Relation Age of Onset  . Cancer Paternal Grandfather        lung ca  . CAD Father        Cabg at age 82  . CAD Mother        MI at age 79  . Stroke Maternal Grandfather   . Cancer Maternal Grandfather   . Heart disease Paternal Grandmother    Review of Systems  Pertinent negatives listed in HPI  Patient Active Problem List   Diagnosis Date Noted  . Tenosynovitis, de Quervain 03/10/2018  . Diarrhea 12/18/2017  . SIRS (systemic inflammatory response syndrome) ([place]) 12/18/2017  . Acute colitis   . Dehydration   . Asthma   . Ulcerative colitis confined to rectum ([place])   . Macrocytosis without anemia 06/01/2014  . Thrombocytosis ([place]) 06/01/2014  . Alcoholic peripheral neuropathy ([place]) 06/01/2014  Allergies  Allergen Reactions  . Adderall Xr [Amphetamine-Dextroamphet Er] Diarrhea  . Benadryl [Diphenhydramine] Itching  . Neosporin [Neomycin-Bacitracin Zn-Polymyx] Rash    Prior to Admission medications   Medication Sig Start Date End Date Taking? Authorizing Provider  acetaminophen (TYLENOL) 500 MG tablet Take 500 mg by mouth every 6 (six) hours as needed for mild pain.     [provider]  Albuterol Sulfate (PROAIR RESPICLICK) 412 (90 Base) MCG/ACT AEPB Inhale 1 puff into the lungs as needed. 03/29/18   McVey, Gelene Mink, PA-C  amphetamine-dextroamphetamine (ADDERALL XR) 30 MG 24 hr capsule Take 1 capsule (30 mg total) by mouth daily. 04/28/18 05/28/18  McVey, Gelene Mink, PA-C  amphetamine-dextroamphetamine (ADDERALL XR) 30 MG 24 hr capsule Take 1 capsule (30 mg total) by mouth daily. 03/29/18 04/28/18  McVey, Gelene Mink, PA-C   amphetamine-dextroamphetamine (ADDERALL XR) 30 MG 24 hr capsule Take 1 capsule (30 mg total) by mouth every morning. 07/12/18 08/11/18  McVey, Gelene Mink, PA-C  amphetamine-dextroamphetamine (ADDERALL) 10 MG tablet Take 1 tablet (10 mg total) by mouth daily. You may take in addition to Adderall 21m PRN 05/29/18 06/28/18  McVey, EGelene Mink PA-C  amphetamine-dextroamphetamine (ADDERALL) 10 MG tablet Take 1 tablet (10 mg total) by mouth daily with breakfast. 04/28/18 05/28/18  McVey, EGelene Mink PA-C  amphetamine-dextroamphetamine (ADDERALL) 10 MG tablet Take 1 tablet (10 mg total) by mouth daily with breakfast. 03/29/18 04/28/18  McVey, EGelene Mink PA-C  budesonide-formoterol (SYMBICORT) 160-4.5 MCG/ACT inhaler Inhale 2 puffs into the lungs every morning. 03/27/18   McVey, EGelene Mink PA-C  conjugated estrogens (PREMARIN) vaginal cream Place 1 Applicatorful vaginally daily. 03/29/18   McVey, EGelene Mink PA-C  Estradiol (YUVAFEM) 10 MCG TABS vaginal tablet Place 1 tablet (10 mcg total) vaginally 2 (two) times a week. 04/01/18   McVey, EGelene Mink PA-C  Ferrous Sulfate (IRON) 28 MG TABS Take 1 tablet by mouth every morning.     [provider]  fexofenadine (ALLEGRA) 180 MG tablet Take 1 tablet (180 mg total) by mouth daily. 03/29/18   McVey, EGelene Mink PA-C  fluocinolone (VANOS) 0.01 % cream Apply topically 2 (two) times daily. 03/29/18   McVey, EGelene Mink PA-C  fluticasone (FLONASE) 50 MCG/ACT nasal spray USE 2 SPRAYS IN THE NOSE ONCE DAILY AS DIRECTED 03/29/18   McVey, EGelene Mink PA-C  loperamide (IMODIUM) 2 MG capsule Take 1 capsule (2 mg total) by mouth 3 (three) times daily as needed for diarrhea or loose stools. 12/20/17   KBonnielee Haff MD  LORazepam (ATIVAN) 0.5 MG tablet Take 1 tablet (0.5 mg total) by mouth 2 (two) times daily as needed for anxiety. 03/29/18   McVey, EGelene Mink PA-C  Multiple Vitamins-Minerals  (CENTRUM SILVER PO) Take 1 tablet by mouth every morning.     [provider]  predniSONE (DELTASONE) 20 MG tablet Take 3 pills x 5 days; 2 pills x 5 days; 1 pill x 5 days 12/26/17   McVey, EGelene Mink PA-C    Past Medical, Surgical Family and Social History reviewed and updated.    Objective:   Today's Vitals   08/17/18 0840  BP: 97/65  Pulse: 87  Resp: 16  Temp: 98.7 F (37.1 C)  TempSrc: Oral  SpO2: 98%  Weight: 116 lb (52.6 kg)  Height: 4' 11.75" (1.518 m)    Wt Readings from Last 3 Encounters:  08/17/18 116 lb (52.6 kg)  03/29/18 111 lb (50.3 kg)  12/26/17 104 lb 9.6 oz (47.4 kg)  Physical Exam General appearance: alert, well developed, well nourished, cooperative and in no distress Head: Normocephalic, without obvious abnormality, atraumatic Respiratory: Respirations even and unlabored, normal respiratory rate Heart: rate and rhythm normal. No gallop or murmurs noted on exam  Extremities: No gross deformities Skin: Skin color, texture, turgor normal. No rashes seen  Psych: Appropriate mood and affect. Neurologic: Mental status: Alert, oriented to person, place, and time, thought content appropriate.   Assessment & Plan:  1. Attention deficit disorder, unspecified hyperactivity presence 2. General anxiety Disorder  Patient presents today needing refills for 90 days of controlled medication. Patient thought she was scheduled to see PCP today. I am PRN provider, therefore patient was offered a small quantity of medication. She declined offer. Message sent to her PCP to refill medications for patient on 08/19/2018. Patient exam was unremarkable and therefore should not need to return for an appointment to pick-up currently due medications.   Return for care 90 days.   A total of 15 minutes spent, greater than 50 % of this time was spent counseling and coordination of care.  -The patient was given clear instructions to go to ER or return to medical  center if symptoms do not improve, worsen or new problems develop. The patient verbalized understanding.     Carroll Sage. Kenton Kingfisher, FNP-C Nurse Practitioner (PRN Staff)  Primary Care at [facility] [address] [place], [place]

## 2018-08-17 NOTE — Patient Instructions (Signed)
° ° ° °  If you have lab work done today you will be contacted with your lab results within the next 2 weeks.  If you have not heard from us then please contact us. The fastest way to get your results is to register for My Chart. ° ° °IF you received an x-ray today, you will receive an invoice from [facility]. Please contact [facility] at [phone] with questions or concerns regarding your invoice.  ° °IF you received labwork today, you will receive an invoice from [facility]. Please contact [facility] at [phone] with questions or concerns regarding your invoice.  ° °Our billing staff will not be able to assist you with questions regarding bills from these companies. ° °You will be contacted with the lab results as soon as they are available. The fastest way to get your results is to activate your My Chart account. Instructions are located on the last page of this paperwork. If you have not heard from us regarding the results in 2 weeks, please contact this office. °  ° ° ° °

## 2018-08-19 ENCOUNTER — Other Ambulatory Visit: Payer: Self-pay | Admitting: Physician Assistant

## 2018-08-19 DIAGNOSIS — F988 Other specified behavioral and emotional disorders with onset usually occurring in childhood and adolescence: Secondary | ICD-10-CM

## 2018-08-19 DIAGNOSIS — F411 Generalized anxiety disorder: Secondary | ICD-10-CM

## 2018-08-19 MED ORDER — AMPHETAMINE-DEXTROAMPHETAMINE 10 MG PO TABS: 10.0000 mg | ORAL_TABLET | Freq: Every day | ORAL | 0 refills | Status: DC

## 2018-08-19 MED ORDER — AMPHETAMINE-DEXTROAMPHET ER 30 MG PO CP24: 30.0000 mg | ORAL_CAPSULE | Freq: Every day | ORAL | 0 refills | Status: DC

## 2018-08-19 MED ORDER — AMPHETAMINE-DEXTROAMPHET ER 30 MG PO CP24: 30.0000 mg | ORAL_CAPSULE | ORAL | 0 refills | Status: DC

## 2018-08-19 MED ORDER — LORAZEPAM 0.5 MG PO TABS: 0.5000 mg | ORAL_TABLET | Freq: Two times a day (BID) | ORAL | 2 refills | Status: DC | PRN

## 2018-08-20 NOTE — Telephone Encounter (Signed)
Pt was seen on Saturday by Lavell Anchors, PA. Pt is requesting Adderall and Lorazepam.

## 2018-08-26 DIAGNOSIS — L718 Other rosacea: Secondary | ICD-10-CM | POA: Diagnosis not present

## 2018-08-26 DIAGNOSIS — L570 Actinic keratosis: Secondary | ICD-10-CM | POA: Diagnosis not present

## 2018-08-26 DIAGNOSIS — L650 Telogen effluvium: Secondary | ICD-10-CM | POA: Diagnosis not present

## 2018-08-26 NOTE — Telephone Encounter (Signed)
This was done last week

## 2018-09-03 DIAGNOSIS — K5190 Ulcerative colitis, unspecified, without complications: Secondary | ICD-10-CM | POA: Diagnosis not present

## 2018-09-04 DIAGNOSIS — K5190 Ulcerative colitis, unspecified, without complications: Secondary | ICD-10-CM | POA: Diagnosis not present

## 2018-09-30 DIAGNOSIS — J0140 Acute pansinusitis, unspecified: Secondary | ICD-10-CM | POA: Diagnosis not present

## 2018-10-14 ENCOUNTER — Other Ambulatory Visit: Payer: Self-pay | Admitting: Physician Assistant

## 2018-10-14 ENCOUNTER — Other Ambulatory Visit: Payer: Self-pay | Admitting: General Practice

## 2018-10-14 DIAGNOSIS — Z1231 Encounter for screening mammogram for malignant neoplasm of breast: Secondary | ICD-10-CM

## 2018-10-15 ENCOUNTER — Ambulatory Visit
Admission: RE | Admit: 2018-10-15 | Discharge: 2018-10-15 | Disposition: A | Discharge status: Home / Self Care | Payer: BLUE CROSS/BLUE SHIELD | Source: Ambulatory Visit | Attending: Physician Assistant | Admitting: Physician Assistant

## 2018-10-15 DIAGNOSIS — Z1231 Encounter for screening mammogram for malignant neoplasm of breast: Secondary | ICD-10-CM | POA: Diagnosis not present

## 2018-11-06 ENCOUNTER — Telehealth: Payer: Self-pay | Admitting: Physician Assistant

## 2018-11-06 NOTE — Telephone Encounter (Signed)
MyChart message sent to pt about needing an appointment for medications

## 2018-11-23 ENCOUNTER — Other Ambulatory Visit: Payer: Self-pay

## 2018-11-23 ENCOUNTER — Encounter: Payer: Self-pay | Admitting: Family Medicine

## 2018-11-23 ENCOUNTER — Ambulatory Visit (INDEPENDENT_AMBULATORY_CARE_PROVIDER_SITE_OTHER): Payer: BLUE CROSS/BLUE SHIELD | Admitting: Family Medicine

## 2018-11-23 VITALS — BP 103/63 | HR 87 | Temp 98.4°F | Resp 14 | Ht 60.0 in | Wt 114.8 lb

## 2018-11-23 DIAGNOSIS — F988 Other specified behavioral and emotional disorders with onset usually occurring in childhood and adolescence: Secondary | ICD-10-CM

## 2018-11-23 DIAGNOSIS — F411 Generalized anxiety disorder: Secondary | ICD-10-CM

## 2018-11-23 DIAGNOSIS — Z79899 Other long term (current) drug therapy: Secondary | ICD-10-CM

## 2018-11-23 MED ORDER — AMPHETAMINE-DEXTROAMPHET ER 30 MG PO CP24: 30.0000 mg | ORAL_CAPSULE | Freq: Every day | ORAL | 0 refills | Status: DC

## 2018-11-23 MED ORDER — ESCITALOPRAM OXALATE 10 MG PO TABS: 10.0000 mg | ORAL_TABLET | Freq: Every day | ORAL | 2 refills | Status: DC

## 2018-11-23 MED ORDER — LORAZEPAM 0.5 MG PO TABS: 0.5000 mg | ORAL_TABLET | Freq: Two times a day (BID) | ORAL | 0 refills | Status: DC | PRN

## 2018-11-23 MED ORDER — AMPHETAMINE-DEXTROAMPHET ER 30 MG PO CP24: 30.0000 mg | ORAL_CAPSULE | ORAL | 0 refills | Status: DC

## 2018-11-23 MED ORDER — AMPHETAMINE-DEXTROAMPHETAMINE 10 MG PO TABS: 10.0000 mg | ORAL_TABLET | Freq: Every day | ORAL | 0 refills | Status: DC | PRN

## 2018-11-23 NOTE — Progress Notes (Signed)
2/8/202012:41 PM  Anita Rice 1956/03/11, 63 y.o. female [id number]  Chief Complaint  Patient presents with  . ADHD    need refills adderall( amphetamine-dextroamphentaqmine) generic [facility] [place]    HPI:   Patient is a 63 y.o. female with past medical history significant for ADD and GAD, asthma, UC who presents today for medication refills  Last OV nov 2019 Previous PCP McVey, PA-C  Dr Penelope Coop, GI, [place] Physician Does not see pulm  Diagnosed ADD about 16 years Has been stable in current regime Generic tolerates well, brand name gives her diarrhea Tends to take mostly during workdays Denies any side effects  Has been on ativan for many many years She has never been prescribed anything else Takes about one day  BP Readings from Last 3 Encounters:  08/17/18 97/65  03/29/18 97/70  12/26/17 118/73    Fall Risk  11/23/2018 08/17/2018 03/29/2018 12/26/2017 12/17/2017  Falls in the past year? 0 0 No No No  Number falls in past yr: 0 - - - -  Injury with Fall? 0 - - - -  Comment - - - - -  Follow up Falls evaluation completed - - - -     Depression screen [facility] 2/9 11/23/2018 08/17/2018 03/29/2018  Decreased Interest 0 0 0  Down, Depressed, Hopeless 0 0 0  PHQ - 2 Score 0 0 0    Allergies  Allergen Reactions  . Adderall Xr [Amphetamine-Dextroamphet Er] Diarrhea  . Benadryl [Diphenhydramine] Itching  . Neosporin [Neomycin-Bacitracin Zn-Polymyx] Rash    Prior to Admission medications   Medication Sig Start Date End Date Taking? Authorizing Provider  acetaminophen (TYLENOL) 500 MG tablet Take 500 mg by mouth every 6 (six) hours as needed for mild pain.    Yes [provider]  Adalimumab (HUMIRA PEN [place]) Inject into the skin every 21 ( twenty-one) days.   Yes [provider]  Albuterol Sulfate (PROAIR RESPICLICK) 791 (90 Base) MCG/ACT AEPB Inhale 1 puff into the lungs as needed. 03/29/18  Yes McVey,  Gelene Mink, PA-C  budesonide-formoterol (SYMBICORT) 160-4.5 MCG/ACT inhaler Inhale 2 puffs into the lungs every morning. 03/27/18  Yes McVey, Gelene Mink, PA-C  Calcium Carb-Cholecalciferol (CALCIUM 500 + D3 PO) Take by mouth daily.   Yes [provider]  conjugated estrogens (PREMARIN) vaginal cream Place 1 Applicatorful vaginally daily. 03/29/18  Yes McVey, Gelene Mink, PA-C  Estradiol (YUVAFEM) 10 MCG TABS vaginal tablet Place 1 tablet (10 mcg total) vaginally 2 (two) times a week. 04/01/18  Yes McVey, Gelene Mink, PA-C  Ferrous Sulfate (IRON) 28 MG TABS Take 1 tablet by mouth every morning.    Yes [provider]  fexofenadine (ALLEGRA) 180 MG tablet Take 1 tablet (180 mg total) by mouth daily. 03/29/18  Yes McVey, Gelene Mink, PA-C  fluocinolone (VANOS) 0.01 % cream Apply topically 2 (two) times daily. 03/29/18  Yes McVey, Gelene Mink, PA-C  fluticasone [facility]) 50 MCG/ACT nasal spray USE 2 SPRAYS IN THE NOSE ONCE DAILY AS DIRECTED 03/29/18  Yes McVey, Gelene Mink, PA-C  loperamide (IMODIUM) 2 MG capsule Take 1 capsule (2 mg total) by mouth 3 (three) times daily as needed for diarrhea or loose stools. 12/20/17  Yes Bonnielee Haff, MD  LORazepam (ATIVAN) 0.5 MG tablet Take 1 tablet (0.5 mg total) by mouth 2 (two) times daily as needed for anxiety. 09/16/18  Yes McVey, Gelene Mink, PA-C  Multiple Vitamins-Minerals (CENTRUM SILVER PO)  Take 1 tablet by mouth every morning.    Yes [provider]  predniSONE (DELTASONE) 20 MG tablet Take 3 pills x 5 days; 2 pills x 5 days; 1 pill x 5 days 12/26/17  Yes McVey, Gelene Mink, PA-C  amphetamine-dextroamphetamine (ADDERALL XR) 30 MG 24 hr capsule Take 1 capsule (30 mg total) by mouth daily. 10/17/18 11/16/18  McVey, Gelene Mink, PA-C  amphetamine-dextroamphetamine (ADDERALL XR) 30 MG 24 hr capsule Take 1 capsule (30 mg total) by mouth daily. 09/16/18 10/16/18  McVey, Gelene Mink,  PA-C  amphetamine-dextroamphetamine (ADDERALL XR) 30 MG 24 hr capsule Take 1 capsule (30 mg total) by mouth every morning. 08/19/18 09/18/18  McVey, Gelene Mink, PA-C  amphetamine-dextroamphetamine (ADDERALL) 10 MG tablet Take 1 tablet (10 mg total) by mouth daily. You may take in addition to Adderall 68m PRN 10/17/18 11/16/18  McVey, EGelene Mink PA-C  amphetamine-dextroamphetamine (ADDERALL) 10 MG tablet Take 1 tablet (10 mg total) by mouth daily with breakfast. 09/16/18 10/16/18  McVey, EGelene Mink PA-C  amphetamine-dextroamphetamine (ADDERALL) 10 MG tablet Take 1 tablet (10 mg total) by mouth daily with breakfast. 08/19/18 09/18/18  McVey, EGelene Mink PA-C    Past Medical History:  Diagnosis Date  . Allergy   . Anemia   . Anxiety   . Asthma   . Hyperlipidemia   . Macrocytosis without anemia 06/01/2014  . Thrombocytosis ([place] 06/01/2014  . Ulcerative colitis confined to rectum ([facility]     Past Surgical History:  Procedure Laterality Date  . COLONOSCOPY W/ BIOPSIES    . COLONOSCOPY WITH PROPOFOL N/A 07/07/2014   Procedure: COLONOSCOPY WITH PROPOFOL;  Surgeon: MGarlan Fair MD;  Location: [facility];  Service: Endoscopy;  Laterality: N/A;  . NASAL SEPTUM SURGERY    . TONSILLECTOMY      Social History   Tobacco Use  . Smoking status: Former SResearch scientist (life sciences) . Smokeless tobacco: Never Used  Substance Use Topics  . Alcohol use: Yes    Comment: 5-7 nights a week     Family History  Problem Relation Age of Onset  . Cancer Paternal Grandfather        lung ca  . CAD Father        Cabg at age 63 . CAD Mother        MI at age 63 . Stroke Maternal Grandfather   . Cancer Maternal Grandfather   . Heart disease Paternal Grandmother     Review of Systems  Constitutional: Negative for malaise/fatigue and weight loss.  Cardiovascular: Negative for chest pain and palpitations.  Psychiatric/Behavioral: The patient is nervous/anxious. The patient does not have insomnia.       OBJECTIVE:  Blood pressure 103/63, pulse 87, temperature 98.4 F (36.9 C), temperature source Oral, resp. rate 14, height 5' (1.524 m), weight 114 lb 12.8 oz (52.1 kg), SpO2 97 %. There is no height or weight on file to calculate BMI.   Physical Exam Vitals signs and nursing note reviewed.  Constitutional:      Appearance: She is well-developed.  HENT:     Head: Normocephalic and atraumatic.     Mouth/Throat:     Pharynx: No oropharyngeal exudate.  Eyes:     General: No scleral icterus.    Conjunctiva/sclera: Conjunctivae normal.     Pupils: Pupils are equal, round, and reactive to light.  Neck:     Musculoskeletal: Neck supple.  Cardiovascular:     Rate and Rhythm: Normal rate and regular rhythm.  Heart sounds: Normal heart sounds. No murmur. No friction rub. No gallop.   Pulmonary:     Effort: Pulmonary effort is normal.     Breath sounds: Normal breath sounds. No wheezing or rales.  Lymphadenopathy:     Cervical: No cervical adenopathy.  Skin:    General: Skin is warm and dry.  Neurological:     Mental Status: She is alert and oriented to person, place, and time.     ASSESSMENT and PLAN  1. Attention deficit disorder, unspecified hyperactivity presence pmp reviewed. uds done today. Stable. Continue current regime.  - ToxASSURE Select 13 (MW), Urine - amphetamine-dextroamphetamine (ADDERALL XR) 30 MG 24 hr capsule; Take 1 capsule (30 mg total) by mouth daily for 30 days. - amphetamine-dextroamphetamine (ADDERALL XR) 30 MG 24 hr capsule; Take 1 capsule (30 mg total) by mouth daily for 30 days. - amphetamine-dextroamphetamine (ADDERALL XR) 30 MG 24 hr capsule; Take 1 capsule (30 mg total) by mouth every morning for 30 days. - amphetamine-dextroamphetamine (ADDERALL) 10 MG tablet; Take 1 tablet (10 mg total) by mouth daily as needed for up to 30 days. You may take in addition to Adderall 100m PRN  2. GAD (generalized anxiety disorder) Discussed daily bzd not  first line treatment. Discussed treatment options, will start lexapro, discussed weaning bzd to prn use.  - ToxASSURE Select 13 (MW), Urine - LORazepam (ATIVAN) 0.5 MG tablet; Take 1 tablet (0.5 mg total) by mouth 2 (two) times daily as needed for anxiety.  3. Encounter for medication management - ToxASSURE Select 13 (MW), Urine  4. Long term prescription benzodiazepine use - ToxASSURE Select 13 (MW), Urine  Other orders - escitalopram (LEXAPRO) 10 MG tablet; Take 1 tablet (10 mg total) by mouth daily.  Return in about 6 weeks (around 01/04/2019).    IRutherford Guys MD [facility] [place] [id number].  [phone] [phone]

## 2018-11-23 NOTE — Patient Instructions (Signed)
° ° ° °  If you have lab work done today you will be contacted with your lab results within the next 2 weeks.  If you have not heard from us then please contact us. The fastest way to get your results is to register for My Chart. ° ° °IF you received an x-ray today, you will receive an invoice from [facility]. Please contact [facility] at [phone] with questions or concerns regarding your invoice.  ° °IF you received labwork today, you will receive an invoice from [facility]. Please contact [facility] at [phone] with questions or concerns regarding your invoice.  ° °Our billing staff will not be able to assist you with questions regarding bills from these companies. ° °You will be contacted with the lab results as soon as they are available. The fastest way to get your results is to activate your My Chart account. Instructions are located on the last page of this paperwork. If you have not heard from us regarding the results in 2 weeks, please contact this office. °  ° ° ° °

## 2018-11-25 ENCOUNTER — Telehealth: Payer: Self-pay | Admitting: *Deleted

## 2018-11-25 NOTE — Telephone Encounter (Signed)
When patient was in the office on 11/23/18 she stated she needed [place] to [phone] to give ok to fill generic brand only. [facility] to [facility] and they stated [facility]. Called pharmacy and they stated as well that the patient gets her medication through there office and it is written for generic brand only and she will not need to have approval. Called patient and informed her that [facility] I told her I done as promise to call the [facility] for generic brand only.

## 2018-11-28 LAB — TOXASSURE SELECT 13 (MW), URINE

## 2018-12-06 NOTE — Telephone Encounter (Signed)
Pt calling.  She is still having issues getting generic medication filled through [facility].  Pt would like to speak with Felicia again.    Pt can be reached at [phone]

## 2018-12-10 NOTE — Telephone Encounter (Signed)
I have received the letter from the pt that stated that she is still having difficulty receiving the medication. Pt requires GENERIC BRAND of ADDERALL namely (amphetamine-dextroamphetamine). Her last Prior Authorization (PA) ended on 11/19/2018, so a new one needed to be completed.   PA DONE [facility] at [phone] Spoke to miss Taranika Approval number: [id number] Effective Dates: 11/19/2018-12/10/2019  I have called the pt and informed her of this and she stated understanding.

## 2018-12-26 DIAGNOSIS — R062 Wheezing: Secondary | ICD-10-CM | POA: Diagnosis not present

## 2018-12-26 DIAGNOSIS — J329 Chronic sinusitis, unspecified: Secondary | ICD-10-CM | POA: Diagnosis not present

## 2018-12-26 DIAGNOSIS — J40 Bronchitis, not specified as acute or chronic: Secondary | ICD-10-CM | POA: Diagnosis not present

## 2019-03-12 ENCOUNTER — Other Ambulatory Visit: Payer: Self-pay | Admitting: Registered Nurse

## 2019-03-12 DIAGNOSIS — F988 Other specified behavioral and emotional disorders with onset usually occurring in childhood and adolescence: Secondary | ICD-10-CM

## 2019-03-12 NOTE — Telephone Encounter (Signed)
Pt is needing refill and first available has appt for 03/26/2019 pt is out of Adderol 23m capsule and escitalopram 10 mg. anphetamin salts er  30 mg capsul  Can she ge refills called in before appt    FR

## 2019-03-14 ENCOUNTER — Other Ambulatory Visit: Payer: Self-pay | Admitting: Family Medicine

## 2019-03-14 MED ORDER — AMPHETAMINE-DEXTROAMPHET ER 30 MG PO CP24: 30.0000 mg | ORAL_CAPSULE | Freq: Every day | ORAL | 0 refills | Status: DC

## 2019-03-14 MED ORDER — AMPHETAMINE-DEXTROAMPHETAMINE 10 MG PO TABS: 10.0000 mg | ORAL_TABLET | Freq: Every day | ORAL | 0 refills | Status: DC | PRN

## 2019-03-14 NOTE — Telephone Encounter (Signed)
pmp reviewd, appropriate meds refilled

## 2019-03-14 NOTE — Telephone Encounter (Signed)
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 She has an establish care visit on 03/26/19 with [name].   Pt is requesting a medication refill as a buffer. Medication is pended

## 2019-03-26 ENCOUNTER — Encounter: Payer: Self-pay | Admitting: Registered Nurse

## 2019-03-26 ENCOUNTER — Ambulatory Visit (INDEPENDENT_AMBULATORY_CARE_PROVIDER_SITE_OTHER): Payer: BC Managed Care – PPO | Admitting: Registered Nurse

## 2019-03-26 ENCOUNTER — Other Ambulatory Visit: Payer: Self-pay

## 2019-03-26 VITALS — BP 108/68 | HR 89 | Temp 98.0°F | Resp 19 | Ht 60.0 in | Wt 118.8 lb

## 2019-03-26 DIAGNOSIS — F411 Generalized anxiety disorder: Secondary | ICD-10-CM | POA: Diagnosis not present

## 2019-03-26 DIAGNOSIS — F988 Other specified behavioral and emotional disorders with onset usually occurring in childhood and adolescence: Secondary | ICD-10-CM | POA: Diagnosis not present

## 2019-03-26 MED ORDER — AMPHETAMINE-DEXTROAMPHET ER 30 MG PO CP24: 30.0000 mg | ORAL_CAPSULE | Freq: Every day | ORAL | 0 refills | Status: DC

## 2019-03-26 MED ORDER — ESCITALOPRAM OXALATE 10 MG PO TABS: 10.0000 mg | ORAL_TABLET | Freq: Every day | ORAL | 0 refills | Status: DC

## 2019-03-26 NOTE — Progress Notes (Signed)
Established Patient Office Visit  Subjective:  Patient ID: Anita Rice, female    DOB: September 02, 1956  Age: 63 y.o. MRN: [id number]  CC:  Chief Complaint  Patient presents with  . Chronic Conditions    3 month follow-up     HPI Anita Rice presents for med check  Recently started Lexapro 27m PO qd after tapering off of Ativan, she had been on Ativan for 30+ years. She reports "no difference" with lexapro, and is happy to continue this medication.  She is taking Adderall 313mcaps PO qd, mostly on work days. This has been very helpful for her. She does not have any issues with it at this time.   She reports some concerns with sleep disturbances lately, however, she attributes this to working from home full time since March. 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 She also reports that she is less physically active than she used to be and is aware she needs to increase her activity and that will help with sleep.  Past Medical History:  Diagnosis Date  . Allergy   . Anemia   . Anxiety   . Asthma   . Hyperlipidemia   . Macrocytosis without anemia 06/01/2014  . Thrombocytosis ([facility]  . [facility]    Past Surgical History:  Procedure Laterality Date  . COLONOSCOPY W/ BIOPSIES    . COLONOSCOPY WITH PROPOFOL N/A 07/07/2014   Procedure: COLONOSCOPY WITH PROPOFOL;  Surgeon: MaGarlan FairMD;  Location: [facility];  Service: Endoscopy;  Laterality: N/A;  . NASAL SEPTUM SURGERY    . TONSILLECTOMY      Family History  Problem Relation Age of Onset  . Cancer Paternal Grandfather        lung ca  . CAD Father        Cabg at age 63. CAD Mother        MI at age 423. Stroke Maternal Grandfather   . Cancer Maternal Grandfather   . Heart disease Paternal Grandmother     Social History   Socioeconomic History  . Marital status: Married     Spouse name: Not on file  . Number of children: 2  . Years of education: Not on file  . 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 Financial resource strain: Not on file  . Food insecurity:    Worry: Not on file    Inability: Not on file  . Transportation needs:    Medical: Not on file    Non-medical: Not on file  Tobacco Use  . Smoking status: Former SmResearch scientist (life sciences). Smokeless tobacco: Never Used  Substance and Sexual Activity  . Alcohol use: Yes    Comment: 5-7 nights a week   . Drug use: No  . Sexual activity: Not on file  Lifestyle  . Physical activity:    Days per week: Not on file    Minutes per session: Not on file  . Stress: Not on file  Relationships  . Social connections:    Talks on phone: Not on file    Gets together: Not on file    Attends religious service: Not on file    Active member of club or organization: Not on  file    Attends meetings of clubs or organizations: Not on file    Relationship status: Not on file  . Intimate partner violence:    Fear of current or ex partner: Not on file    Emotionally abused: Not on file    Physically abused: Not on file    Forced sexual activity: Not on file  Other Topics Concern  . Not on file  Social History Narrative  . Not on file    Outpatient Medications Prior to Visit  Medication Sig Dispense Refill  . acetaminophen (TYLENOL) 500 MG tablet Take 500 mg by mouth every 6 (six) hours as needed for mild pain.     . Adalimumab (HUMIRA PEN [place]) Inject into the skin every 21 ( twenty-one) days.    . Albuterol Sulfate (PROAIR RESPICLICK) 458 (90 Base) MCG/ACT AEPB Inhale 1 puff into the lungs as needed. 1 each 5  . amphetamine-dextroamphetamine (ADDERALL XR) 30 MG 24 hr capsule Take 1 capsule (30 mg total) by mouth daily for 30 days. 30 capsule 0  . amphetamine-dextroamphetamine (ADDERALL) 10 MG tablet Take 1 tablet (10 mg total)  by mouth daily as needed for up to 30 days. You may take in addition to Adderall 53m PRN 30 tablet 0  . Calcium Carb-Cholecalciferol (CALCIUM 500 + D3 PO) Take by mouth daily.    .Marland Kitchenconjugated estrogens (PREMARIN) vaginal cream Place 1 Applicatorful vaginally daily. 42.5 g 12  . escitalopram (LEXAPRO) 10 MG tablet TAKE 1 TABLET BY MOUTH EVERY DAY 30 tablet 1  . Estradiol (YUVAFEM) 10 MCG TABS vaginal tablet Place 1 tablet (10 mcg total) vaginally 2 (two) times a week. 8 tablet 3  . Ferrous Sulfate (IRON) 28 MG TABS Take 1 tablet by mouth every morning.     . fexofenadine (ALLEGRA) 180 MG tablet Take 1 tablet (180 mg total) by mouth daily. 30 tablet 11  . fluocinolone (VANOS) 0.01 % cream Apply topically 2 (two) times daily. 30 g 0  . fluticasone (FLONASE) 50 MCG/ACT nasal spray USE 2 SPRAYS IN THE NOSE ONCE DAILY AS DIRECTED 48 g 5  . loperamide (IMODIUM) 2 MG capsule Take 1 capsule (2 mg total) by mouth 3 (three) times daily as needed for diarrhea or loose stools. 30 capsule 0  . Multiple Vitamins-Minerals (CENTRUM SILVER PO) Take 1 tablet by mouth every morning.     .Marland KitchenLORazepam (ATIVAN) 0.5 MG tablet Take 1 tablet (0.5 mg total) by mouth 2 (two) times daily as needed for anxiety. 60 tablet 0  . predniSONE (DELTASONE) 20 MG tablet Take 3 pills x 5 days; 2 pills x 5 days; 1 pill x 5 days 30 tablet 0  . amphetamine-dextroamphetamine (ADDERALL XR) 30 MG 24 hr capsule Take 1 capsule (30 mg total) by mouth daily for 30 days. 30 capsule 0  . amphetamine-dextroamphetamine (ADDERALL XR) 30 MG 24 hr capsule Take 1 capsule (30 mg total) by mouth every morning for 30 days. 30 capsule 0  . budesonide-formoterol (SYMBICORT) 160-4.5 MCG/ACT inhaler Inhale 2 puffs into the lungs every morning. (Patient not taking: Reported on 03/26/2019) 1 Inhaler 6   No facility-administered medications prior to visit.     Allergies  Allergen Reactions  . Adderall Xr [Amphetamine-Dextroamphet Er] Diarrhea  . Benadryl  [Diphenhydramine] Itching  . Neosporin [Neomycin-Bacitracin Zn-Polymyx] Rash    ROS Review of Systems  Constitutional: Negative.   HENT: Negative.   Eyes: Negative.   Respiratory: Negative.   Cardiovascular: Negative.   Gastrointestinal:  Negative.   Endocrine: Negative.   Genitourinary: Negative.   Musculoskeletal: Negative.   Skin: Negative.   Allergic/Immunologic: Negative.   Neurological: Negative.   Hematological: Negative.   Psychiatric/Behavioral: Negative.       Objective:    Physical Exam  Constitutional: She is oriented to person, place, and time. She appears well-developed and well-nourished.  Cardiovascular: Normal rate and regular rhythm.  Pulmonary/Chest: Effort normal and breath sounds normal.  Neurological: She is alert and oriented to person, place, and time.  Psychiatric: She has a normal mood and affect. Her behavior is normal. Judgment and thought content normal.  Nursing note and vitals reviewed.   BP 108/68   Pulse 89   Temp 98 F (36.7 C) (Oral)   Resp 19   Ht 5' (1.524 m)   Wt 118 lb 12.8 oz (53.9 kg)   SpO2 98%   BMI 23.20 kg/m  Wt Readings from Last 3 Encounters:  03/26/19 118 lb 12.8 oz (53.9 kg)  11/23/18 114 lb 12.8 oz (52.1 kg)  08/17/18 116 lb (52.6 kg)     Health Maintenance Due  Topic Date Due  . TETANUS/TDAP  06/17/1975  . PAP SMEAR-Modifier  02/17/2018    There are no preventive care reminders to display for this patient.  Lab Results  Component Value Date   TSH 0.689 10/11/2017   Lab Results  Component Value Date   WBC 13.7 (A) 12/26/2017   HGB 12.6 12/26/2017   HCT 37.3 (A) 12/26/2017   MCV 95.0 12/26/2017   PLT 673 (H) 12/20/2017   Lab Results  Component Value Date   NA 131 (L) 12/20/2017   K 3.5 12/20/2017   CO2 26 12/20/2017   GLUCOSE 115 (H) 12/20/2017   BUN <5 (L) 12/20/2017   CREATININE 0.53 12/20/2017   BILITOT 0.8 12/17/2017   ALKPHOS 63 12/17/2017   AST 14 (L) 12/17/2017   ALT 12 (L)  12/17/2017   PROT 6.9 12/17/2017   ALBUMIN 2.8 (L) 12/17/2017   CALCIUM 7.9 (L) 12/20/2017   ANIONGAP 8 12/20/2017   No results found for: CHOL No results found for: HDL No results found for: LDLCALC No results found for: TRIG No results found for: CHOLHDL No results found for: HGBA1C    Assessment & Plan:   Problem List Items Addressed This Visit    None    Visit Diagnoses    Attention deficit disorder, unspecified hyperactivity presence    -  Primary   GAD (generalized anxiety disorder)          No orders of the defined types were placed in this encounter.   Follow-up: Return in about 3 months (around 06/26/2019).   PLAN:  3 mos of refills to be given, pt will then follow up with PCP Dr. Pamella Pert if needed. Pt reports that she is not happy with the care she had received here, alluding to a negative experience within the past few years. Unfortunately, she has been bounced between a number of providers. I offered my services as her PCP and let her know I would be happy to see her for any chronic or urgent needs.   Pt denied labs today - states that she has been charged for unnecessary labs in past, does not want that to be the case today.   Patient encouraged to call clinic with any questions, comments, or concerns.   Maximiano Coss, NP

## 2019-03-26 NOTE — Patient Instructions (Signed)
° ° ° °  If you have lab work done today you will be contacted with your lab results within the next 2 weeks.  If you have not heard from us then please contact us. The fastest way to get your results is to register for My Chart. ° ° °IF you received an x-ray today, you will receive an invoice from [facility]. Please contact  Radiology at [phone] with questions or concerns regarding your invoice.  ° °IF you received labwork today, you will receive an invoice from [facility]. Please contact [facility] at [phone] with questions or concerns regarding your invoice.  ° °Our billing staff will not be able to assist you with questions regarding bills from these companies. ° °You will be contacted with the lab results as soon as they are available. The fastest way to get your results is to activate your My Chart account. Instructions are located on the last page of this paperwork. If you have not heard from us regarding the results in 2 weeks, please contact this office. °  ° ° ° °

## 2019-04-17 ENCOUNTER — Other Ambulatory Visit: Payer: Self-pay | Admitting: Physician Assistant

## 2019-04-17 DIAGNOSIS — J302 Other seasonal allergic rhinitis: Secondary | ICD-10-CM

## 2019-07-07 ENCOUNTER — Other Ambulatory Visit: Payer: Self-pay | Admitting: Physician Assistant

## 2019-07-07 DIAGNOSIS — R0981 Nasal congestion: Secondary | ICD-10-CM

## 2019-07-07 DIAGNOSIS — N952 Postmenopausal atrophic vaginitis: Secondary | ICD-10-CM

## 2019-07-07 DIAGNOSIS — J45909 Unspecified asthma, uncomplicated: Secondary | ICD-10-CM

## 2019-07-07 NOTE — Telephone Encounter (Signed)
Requested medication (s) are due for refill today -yes  Requested medication (s) are on the active medication list -yes  Future visit scheduled -no  Last refill: 1 year  Notes to clinic: Patient is requesting refill of expired Rx- sent for PCP to review.  Requested Prescriptions  Pending Prescriptions Disp Refills   YUVAFEM 10 MCG TABS vaginal tablet [Pharmacy Med Name: YUVAFEM 10 MCG VAGINAL INSERT] 8 tablet 3    Sig: PLACE 1 TABLET (10 MCG TOTAL) VAGINALLY 2 (TWO) TIMES A WEEK.     OB/GYN:  Estrogens Passed - 07/07/2019  3:00 PM      Passed - Mammogram is up-to-date per Health Maintenance      Passed - Last BP in normal range    BP Readings from Last 1 Encounters:  03/26/19 108/68         Passed - Valid encounter within last 12 months    Recent Outpatient Visits          3 months ago Attention deficit disorder, unspecified hyperactivity presence   [facility], [place], NP   7 months ago Attention deficit disorder, unspecified hyperactivity presence   [facility] at [facility], Lilia Argue, MD   10 months ago Attention deficit disorder, unspecified hyperactivity presence   Primary Care at [facility], [facility], [place]   1 year ago Attention deficit disorder, unspecified hyperactivity presence   Primary Care at [facility], Gelene Mink, PA-C   1 year ago Ulcerative colitis with complication, unspecified location [facility])   Primary Care at [facility], Gelene Mink, PA-C                Requested Prescriptions  Pending Prescriptions Disp Refills   YUVAFEM 10 MCG TABS vaginal tablet [Pharmacy Med Name: YUVAFEM 10 MCG VAGINAL INSERT] 8 tablet 3    Sig: PLACE 1 TABLET (10 MCG TOTAL) VAGINALLY 2 (TWO) TIMES A WEEK.     OB/GYN:  Estrogens Passed - 07/07/2019  3:00 PM      Passed - Mammogram is up-to-date per Health Maintenance      Passed - Last BP in normal range    BP Readings from Last 1 Encounters:  03/26/19 108/68         Passed -  Valid encounter within last 12 months    Recent Outpatient Visits          3 months ago Attention deficit disorder, unspecified hyperactivity presence   [facility] at [facility], [place], NP   7 months ago Attention deficit disorder, unspecified hyperactivity presence   [facility] at [facility], Lilia Argue, MD   10 months ago Attention deficit disorder, unspecified hyperactivity presence   Primary Care at [facility], Carroll Sage, [place]   1 year ago Attention deficit disorder, unspecified hyperactivity presence   Primary Care at [facility], Gelene Mink, PA-C   1 year ago Ulcerative colitis with complication, unspecified location [facility])   Primary Care at [facility], Gelene Mink, [place]

## 2019-07-07 NOTE — Telephone Encounter (Signed)
Requested medication (s) are due for refill today -yes  Requested medication (s) are on the active medication list -yes  Future visit scheduled - no  Last refill: 1 year ago  Notes to clinic: Patient is requesting refills on expired Rx- sent to PCP for review.  Requested Prescriptions  Pending Prescriptions Disp Refills   SYMBICORT 160-4.5 MCG/ACT inhaler [Pharmacy Med Name: SYMBICORT 160-4.5 MCG INHALER] 10.2 Inhaler 6    Sig: INHALE 2 PUFFS INTO THE LUNGS EVERY MORNING.     There is no refill protocol information for this order     fluticasone (FLONASE) 50 MCG/ACT nasal spray [Pharmacy Med Name: FLUTICASONE PROP 50 MCG SPRAY] 16 mL 17    Sig: USE 2 SPRAYS IN THE NOSE ONCE DAILY AS DIRECTED     Ear, Nose, and Throat: Nasal Preparations - Corticosteroids Passed - 07/07/2019  2:57 PM      Passed - Valid encounter within last 12 months    Recent Outpatient Visits          3 months ago Attention deficit disorder, unspecified hyperactivity presence   [facility], [place], NP   7 months ago Attention deficit disorder, unspecified hyperactivity presence   [facility] at [facility], Lilia Argue, MD   10 months ago Attention deficit disorder, unspecified hyperactivity presence   Primary Care at [facility], Carroll Sage, [place]   1 year ago Attention deficit disorder, unspecified hyperactivity presence   Primary Care at [facility], Gelene Mink, PA-C   1 year ago Ulcerative colitis with complication, unspecified location [facility])   Primary Care at [facility], Gelene Mink, PA-C                Requested Prescriptions  Pending Prescriptions Disp Refills   SYMBICORT 160-4.5 MCG/ACT inhaler [Pharmacy Med Name: SYMBICORT 160-4.5 MCG INHALER] 10.2 Inhaler 6    Sig: INHALE 2 PUFFS INTO THE LUNGS EVERY MORNING.     There is no refill protocol information for this order     fluticasone (FLONASE) 50 MCG/ACT nasal spray [Pharmacy Med Name: FLUTICASONE PROP 50  MCG SPRAY] 16 mL 17    Sig: USE 2 SPRAYS IN THE NOSE ONCE DAILY AS DIRECTED     Ear, Nose, and Throat: Nasal Preparations - Corticosteroids Passed - 07/07/2019  2:57 PM      Passed - Valid encounter within last 12 months    Recent Outpatient Visits          3 months ago Attention deficit disorder, unspecified hyperactivity presence   [facility], [place], NP   7 months ago Attention deficit disorder, unspecified hyperactivity presence   [facility] at [facility], Lilia Argue, MD   10 months ago Attention deficit disorder, unspecified hyperactivity presence   Primary Care at [facility], Carroll Sage, [place]   1 year ago Attention deficit disorder, unspecified hyperactivity presence   Primary Care at [facility], Gelene Mink, PA-C   1 year ago Ulcerative colitis with complication, unspecified location [facility])   Primary Care at [facility], Gelene Mink, [place]

## 2019-07-10 ENCOUNTER — Telehealth (INDEPENDENT_AMBULATORY_CARE_PROVIDER_SITE_OTHER): Payer: BC Managed Care – PPO | Admitting: Family Medicine

## 2019-07-10 DIAGNOSIS — J45909 Unspecified asthma, uncomplicated: Secondary | ICD-10-CM | POA: Diagnosis not present

## 2019-07-10 DIAGNOSIS — N952 Postmenopausal atrophic vaginitis: Secondary | ICD-10-CM | POA: Diagnosis not present

## 2019-07-10 DIAGNOSIS — J302 Other seasonal allergic rhinitis: Secondary | ICD-10-CM | POA: Diagnosis not present

## 2019-07-10 DIAGNOSIS — F411 Generalized anxiety disorder: Secondary | ICD-10-CM

## 2019-07-10 DIAGNOSIS — R0981 Nasal congestion: Secondary | ICD-10-CM

## 2019-07-10 MED ORDER — FEXOFENADINE HCL 180 MG PO TABS: 180.0000 mg | ORAL_TABLET | Freq: Every day | ORAL | 3 refills | Status: DC

## 2019-07-10 MED ORDER — FLUTICASONE PROPIONATE 50 MCG/ACT NA SUSP: 2.0000 | Freq: Every day | NASAL | 11 refills | Status: DC

## 2019-07-10 MED ORDER — PROAIR RESPICLICK 108 (90 BASE) MCG/ACT IN AEPB: 1.0000 | INHALATION_SPRAY | RESPIRATORY_TRACT | 5 refills | Status: AC | PRN

## 2019-07-10 MED ORDER — ESTRADIOL 10 MCG VA TABS: ORAL_TABLET | VAGINAL | 3 refills | Status: AC

## 2019-07-10 MED ORDER — FLUTICASONE-SALMETEROL 250-50 MCG/DOSE IN AEPB: 1.0000 | INHALATION_SPRAY | Freq: Two times a day (BID) | RESPIRATORY_TRACT | 11 refills | Status: AC

## 2019-07-10 MED ORDER — ESCITALOPRAM OXALATE 10 MG PO TABS: 10.0000 mg | ORAL_TABLET | Freq: Every day | ORAL | 1 refills | Status: DC

## 2019-07-10 NOTE — Progress Notes (Signed)
Virtual Visit Note  I connected with patient on 07/10/19 at 520pm by phone and verified that I am speaking with the correct person using two identifiers. Anita Rice is currently located at home and patient is currently with them during visit. The provider, Rutherford Guys, MD is located in their office at time of visit.  I discussed the limitations, risks, security and privacy concerns of performing an evaluation and management service by telephone and the availability of in person appointments. I also discussed with the patient that there may be a patient responsible charge related to this service. The patient expressed understanding and agreed to proceed.   CC: med refills  HPI ? Patient is a 63 y.o. female with past medical history significant for ADD and GAD, asthma, UC, atrophic vaginits who presents today for medication refills  Last OV June 2020 Anita Mustard, NP pmp reviewed She has no acute concerns She is doing well on current regime Takes symbicort daily for her asthma, very well controlled, hardly ever needs albuterol However [facility] does not cover In the past she has done well on advair as well She reports anxiety well controlled on lexapro She uses vagifem for vaginal atrophy, hardly ever uses premarin, denies any vaginal bleeding Last mammo dec 2019     Allergies  Allergen Reactions  . Adderall Xr [Amphetamine-Dextroamphet Er] Diarrhea  . Benadryl [Diphenhydramine] Itching  . Neosporin [Neomycin-Bacitracin Zn-Polymyx] Rash    Prior to Admission medications   Medication Sig Start Date End Date Taking? Authorizing Provider  acetaminophen (TYLENOL) 500 MG tablet Take 500 mg by mouth every 6 (six) hours as needed for mild pain.    Yes [provider]  Adalimumab (HUMIRA PEN [place]) Inject into the skin every 21 ( twenty-one) days.   Yes [provider]  Albuterol Sulfate (PROAIR RESPICLICK) 338 (90 Base) MCG/ACT AEPB Inhale 1 puff into the  lungs as needed. 03/29/18  Yes McVey, Gelene Mink, PA-C  Calcium Carb-Cholecalciferol (CALCIUM 500 + D3 PO) Take by mouth daily.   Yes [provider]  conjugated estrogens (PREMARIN) vaginal cream Place 1 Applicatorful vaginally daily. 03/29/18  Yes McVey, Gelene Mink, PA-C  Ferrous Sulfate (IRON) 28 MG TABS Take 1 tablet by mouth every morning.    Yes [provider]  fexofenadine (ALLEGRA) 180 MG tablet TAKE 1 TABLET BY MOUTH EVERY DAY 04/17/19  Yes McVey, Gelene Mink, PA-C  fluticasone (FLONASE) 50 MCG/ACT nasal spray USE 2 SPRAYS IN THE NOSE ONCE DAILY AS DIRECTED 07/08/19  Yes Maximiano Coss, NP  Multiple Vitamins-Minerals (CENTRUM SILVER PO) Take 1 tablet by mouth every morning.    Yes [provider]  SYMBICORT 160-4.5 MCG/ACT inhaler INHALE 2 PUFFS INTO THE LUNGS EVERY MORNING. 07/08/19  Yes Maximiano Coss, NP  YUVAFEM 10 MCG TABS vaginal tablet PLACE 1 TABLET (10 MCG TOTAL) VAGINALLY 2 (TWO) TIMES A WEEK. 07/08/19  Yes Maximiano Coss, NP  escitalopram (LEXAPRO) 10 MG tablet Take 1 tablet (10 mg total) by mouth daily. 03/26/19 06/24/19  Maximiano Coss, NP    Past Medical History:  Diagnosis Date  . Allergy   . Anemia   . Anxiety   . Asthma   . Hyperlipidemia   . Macrocytosis without anemia 06/01/2014  . Thrombocytosis ([place]) 06/01/2014  . Ulcerative colitis confined to rectum [facility])     Past Surgical History:  Procedure Laterality Date  . COLONOSCOPY W/ BIOPSIES    . COLONOSCOPY WITH PROPOFOL N/A 07/07/2014   Procedure: COLONOSCOPY  WITH PROPOFOL;  Surgeon: Garlan Fair, MD;  Location: [facility];  Service: Endoscopy;  Laterality: N/A;  . NASAL SEPTUM SURGERY    . TONSILLECTOMY      Social History   Tobacco Use  . Smoking status: Former Research scientist (life sciences)  . Smokeless tobacco: Never Used  Substance Use Topics  . Alcohol use: Yes    Comment: 5-7 nights a week     Family History  Problem Relation Age of Onset  . Cancer Paternal  Grandfather        lung ca  . CAD Father        Cabg at age 83  . CAD Mother        MI at age 67  . Stroke Maternal Grandfather   . Cancer Maternal Grandfather   . Heart disease Paternal Grandmother     ROS Per hpi  Objective  Vitals as reported by the patient: none   ASSESSMENT and PLAN  1. Vaginal atrophy - Estradiol (YUVAFEM) 10 MCG TABS vaginal tablet; PLACE 1 TABLET (10 MCG TOTAL) VAGINALLY 2 (TWO) TIMES A WEEK.  2. GAD (generalized anxiety disorder) - escitalopram (LEXAPRO) 10 MG tablet; Take 1 tablet (10 mg total) by mouth daily.  3. Uncomplicated asthma, unspecified asthma severity, unspecified whether persistent - Albuterol Sulfate (PROAIR RESPICLICK) 469 (90 Base) MCG/ACT AEPB; Inhale 1 puff into the lungs as needed.  4. Seasonal allergies - fexofenadine (ALLEGRA) 180 MG tablet; Take 1 tablet (180 mg total) by mouth daily.  5. Nasal congestion - fluticasone (FLONASE) 50 MCG/ACT nasal spray; Place 2 sprays into both nostrils daily.  Other orders - Fluticasone-Salmeterol (ADVAIR DISKUS) 250-50 MCG/DOSE AEPB; Inhale 1 puff into the lungs 2 (two) times daily.  Chronic conditions controlled. Changing symbircort to advair. If not covered patient to let us know. meds refilled  FOLLOW-UP: 6 months   The above assessment and management plan was discussed with the patient. The patient verbalized understanding of and has agreed to the management plan. Patient is aware to call the clinic if symptoms persist or worsen. Patient is aware when to return to the clinic for a follow-up visit. Patient educated on when it is appropriate to go to the emergency department.    I provided 15 minutes of non-face-to-face time during this encounter.  Rutherford Guys, MD Primary Care at [place], [place] [address] Ph.  [phone] Fax [phone]

## 2019-07-10 NOTE — Progress Notes (Signed)
Pt is needing a refill on lexapro, premarin, and vaginal tablets. She also needs and alternative to symbicort due to the cost of the medication not being covered. She is having no other medical concerns

## 2019-07-23 ENCOUNTER — Telehealth: Payer: Self-pay | Admitting: Family Medicine

## 2019-07-23 NOTE — Telephone Encounter (Signed)
Copied from [place] [id number]. Topic: Quick Communication - Rx Refill/Question >> Jul 22, 2019  4:22 PM Erick Blinks wrote: Reason for CRM: Pt has been out of generic adderal for 3 weeks, requesting a nurse call back to discuss why  Best contact: [phone]

## 2019-07-23 NOTE — Telephone Encounter (Signed)
Copied from [place] [id number]. Topic: Quick Communication - Rx Refill/Question >> Jul 22, 2019  4:22 PM Erick Blinks wrote: Reason for CRM: Pt has been out of generic adderal for 3 weeks, requesting a nurse call back to discuss why  Best contact: [phone]

## 2019-07-23 NOTE — Telephone Encounter (Signed)
Good afternoon Dr. Pamella Pert,   I spoke with Anita Rice who was certain that her 15/97 OV was to obtain an Adderall refill, but I am not seeing it mentioned in the notes.  She would also like a refill of her Fluocinolone cream.    (The adderall must be the generic.  [facility] pharmacy is [facility] [address].)   Please let me know if you need anything from me.   Thanks,  [employer]

## 2019-07-24 ENCOUNTER — Other Ambulatory Visit: Payer: Self-pay | Admitting: Sports Medicine

## 2019-07-24 ENCOUNTER — Other Ambulatory Visit: Payer: Self-pay

## 2019-07-24 DIAGNOSIS — Z13820 Encounter for screening for osteoporosis: Secondary | ICD-10-CM

## 2019-07-24 DIAGNOSIS — M25551 Pain in right hip: Secondary | ICD-10-CM | POA: Diagnosis not present

## 2019-07-24 DIAGNOSIS — F988 Other specified behavioral and emotional disorders with onset usually occurring in childhood and adolescence: Secondary | ICD-10-CM

## 2019-07-24 DIAGNOSIS — M47816 Spondylosis without myelopathy or radiculopathy, lumbar region: Secondary | ICD-10-CM | POA: Diagnosis not present

## 2019-07-24 DIAGNOSIS — N898 Other specified noninflammatory disorders of vagina: Secondary | ICD-10-CM

## 2019-07-24 DIAGNOSIS — S63501A Unspecified sprain of right wrist, initial encounter: Secondary | ICD-10-CM | POA: Diagnosis not present

## 2019-07-24 MED ORDER — AMPHETAMINE-DEXTROAMPHET ER 30 MG PO CP24: 30.0000 mg | ORAL_CAPSULE | Freq: Every day | ORAL | 0 refills | Status: DC

## 2019-07-24 MED ORDER — FLUOCINOLONE ACETONIDE 0.01 % EX CREA: TOPICAL_CREAM | Freq: Two times a day (BID) | CUTANEOUS | 1 refills | Status: DC

## 2019-07-24 NOTE — Addendum Note (Signed)
Addended by: Rutherford Guys on: 07/24/2019 07:03 PM   Modules accepted: Orders

## 2019-07-24 NOTE — Telephone Encounter (Signed)
Results reviewed

## 2019-07-24 NOTE — Telephone Encounter (Signed)
Hi Dr. Pamella Pert,   Please see message below regarding med refills.

## 2019-07-25 DIAGNOSIS — H60501 Unspecified acute noninfective otitis externa, right ear: Secondary | ICD-10-CM | POA: Diagnosis not present

## 2019-07-25 DIAGNOSIS — H65191 Other acute nonsuppurative otitis media, right ear: Secondary | ICD-10-CM | POA: Diagnosis not present

## 2019-08-05 ENCOUNTER — Other Ambulatory Visit: Payer: Self-pay | Admitting: Registered Nurse

## 2019-08-05 DIAGNOSIS — R0981 Nasal congestion: Secondary | ICD-10-CM

## 2019-08-27 ENCOUNTER — Telehealth: Payer: Self-pay | Admitting: Family Medicine

## 2019-08-27 ENCOUNTER — Telehealth: Payer: Self-pay

## 2019-08-27 MED ORDER — AMPHETAMINE-DEXTROAMPHET ER 30 MG PO CP24: 30.0000 mg | ORAL_CAPSULE | Freq: Every day | ORAL | 0 refills | Status: DC

## 2019-08-27 MED ORDER — AMPHETAMINE-DEXTROAMPHETAMINE 10 MG PO TABS: 10.0000 mg | ORAL_TABLET | Freq: Every day | ORAL | 0 refills | Status: DC | PRN

## 2019-08-27 NOTE — Telephone Encounter (Signed)
pmp reviewed meds refilled

## 2019-08-27 NOTE — Telephone Encounter (Signed)
Sent message via mychart to pt letting her know her pcp is not here today, I will follow up with her tomorrow.

## 2019-08-27 NOTE — Telephone Encounter (Signed)
Prescription that was called in was called in incorrectly  For amphetamine-dextroamphetamine (ADDERALL XR) 30 MG. They did not call in for 3 months was only called in for 1 month FR  Please advise  Pt also needs 10 milligram tablet in addition not extended release

## 2019-09-04 DIAGNOSIS — Z20828 Contact with and (suspected) exposure to other viral communicable diseases: Secondary | ICD-10-CM | POA: Diagnosis not present

## 2019-09-09 DIAGNOSIS — K5100 Ulcerative (chronic) pancolitis without complications: Secondary | ICD-10-CM | POA: Diagnosis not present

## 2019-09-17 DIAGNOSIS — Z20818 Contact with and (suspected) exposure to other bacterial communicable diseases: Secondary | ICD-10-CM | POA: Diagnosis not present

## 2019-09-30 ENCOUNTER — Other Ambulatory Visit: Payer: Self-pay | Admitting: Family Medicine

## 2019-09-30 DIAGNOSIS — Z1231 Encounter for screening mammogram for malignant neoplasm of breast: Secondary | ICD-10-CM

## 2019-10-02 ENCOUNTER — Other Ambulatory Visit: Payer: Self-pay | Admitting: Registered Nurse

## 2019-10-02 DIAGNOSIS — F411 Generalized anxiety disorder: Secondary | ICD-10-CM

## 2019-10-13 ENCOUNTER — Ambulatory Visit
Admission: RE | Admit: 2019-10-13 | Discharge: 2019-10-13 | Disposition: A | Discharge status: Home / Self Care | Payer: BC Managed Care – PPO | Source: Ambulatory Visit | Attending: Sports Medicine | Admitting: Sports Medicine

## 2019-10-13 ENCOUNTER — Other Ambulatory Visit: Payer: Self-pay

## 2019-10-13 DIAGNOSIS — Z13820 Encounter for screening for osteoporosis: Secondary | ICD-10-CM

## 2019-10-13 DIAGNOSIS — M8589 Other specified disorders of bone density and structure, multiple sites: Secondary | ICD-10-CM | POA: Diagnosis not present

## 2019-10-13 DIAGNOSIS — Z780 Asymptomatic menopausal state: Secondary | ICD-10-CM | POA: Diagnosis not present

## 2019-10-15 ENCOUNTER — Telehealth: Payer: Self-pay | Admitting: Family Medicine

## 2019-10-15 NOTE — Telephone Encounter (Signed)
PA REQUEST PROCESSED. KEY [id number].

## 2019-10-15 NOTE — Telephone Encounter (Signed)
PA request was canceled by insurance. Pharmacy requested drug change.   Prescription changed to 20 mg tablet, 3x a day.  Original prescription was 60 mg, 1x a day.  Mei spoke with pt. Pt agreeable.

## 2019-10-16 DIAGNOSIS — L300 Nummular dermatitis: Secondary | ICD-10-CM | POA: Diagnosis not present

## 2019-10-16 DIAGNOSIS — M810 Age-related osteoporosis without current pathological fracture: Secondary | ICD-10-CM | POA: Diagnosis not present

## 2019-10-16 DIAGNOSIS — R5383 Other fatigue: Secondary | ICD-10-CM | POA: Diagnosis not present

## 2019-10-16 DIAGNOSIS — E559 Vitamin D deficiency, unspecified: Secondary | ICD-10-CM | POA: Diagnosis not present

## 2019-10-22 DIAGNOSIS — E559 Vitamin D deficiency, unspecified: Secondary | ICD-10-CM | POA: Diagnosis not present

## 2019-10-22 DIAGNOSIS — M8580 Other specified disorders of bone density and structure, unspecified site: Secondary | ICD-10-CM | POA: Diagnosis not present

## 2019-11-17 ENCOUNTER — Other Ambulatory Visit: Payer: Self-pay

## 2019-11-17 ENCOUNTER — Ambulatory Visit
Admission: RE | Admit: 2019-11-17 | Discharge: 2019-11-17 | Disposition: A | Discharge status: Home / Self Care | Payer: BC Managed Care – PPO | Source: Ambulatory Visit | Attending: Family Medicine | Admitting: Family Medicine

## 2019-11-17 DIAGNOSIS — Z1231 Encounter for screening mammogram for malignant neoplasm of breast: Secondary | ICD-10-CM | POA: Diagnosis not present

## 2019-12-11 ENCOUNTER — Telehealth (INDEPENDENT_AMBULATORY_CARE_PROVIDER_SITE_OTHER): Payer: BC Managed Care – PPO | Admitting: Family Medicine

## 2019-12-11 ENCOUNTER — Other Ambulatory Visit: Payer: Self-pay

## 2019-12-11 ENCOUNTER — Telehealth: Payer: Self-pay

## 2019-12-11 VITALS — Ht 60.0 in | Wt 118.0 lb

## 2019-12-11 DIAGNOSIS — J302 Other seasonal allergic rhinitis: Secondary | ICD-10-CM

## 2019-12-11 DIAGNOSIS — F988 Other specified behavioral and emotional disorders with onset usually occurring in childhood and adolescence: Secondary | ICD-10-CM | POA: Diagnosis not present

## 2019-12-11 DIAGNOSIS — F411 Generalized anxiety disorder: Secondary | ICD-10-CM

## 2019-12-11 MED ORDER — FEXOFENADINE HCL 180 MG PO TABS: 180.0000 mg | ORAL_TABLET | Freq: Every day | ORAL | 3 refills | Status: AC

## 2019-12-11 MED ORDER — AMPHETAMINE-DEXTROAMPHET ER 30 MG PO CP24: 30.0000 mg | ORAL_CAPSULE | Freq: Every day | ORAL | 0 refills | Status: DC

## 2019-12-11 MED ORDER — AMPHETAMINE-DEXTROAMPHET ER 30 MG PO CP24: 30.0000 mg | ORAL_CAPSULE | Freq: Every day | ORAL | 0 refills | Status: AC

## 2019-12-11 MED ORDER — AMPHETAMINE-DEXTROAMPHETAMINE 10 MG PO TABS: 10.0000 mg | ORAL_TABLET | Freq: Every day | ORAL | 0 refills | Status: DC | PRN

## 2019-12-11 MED ORDER — AMPHETAMINE-DEXTROAMPHETAMINE 10 MG PO TABS: 10.0000 mg | ORAL_TABLET | Freq: Every day | ORAL | 0 refills | Status: AC | PRN

## 2019-12-11 MED ORDER — ESCITALOPRAM OXALATE 10 MG PO TABS: 10.0000 mg | ORAL_TABLET | Freq: Every day | ORAL | 1 refills | Status: DC

## 2019-12-11 NOTE — Progress Notes (Signed)
Virtual Visit Note  I connected with patient on 12/11/19 at 514pm by phone and verified that I am speaking with the correct person using two identifiers. Anita Rice is currently located at home and patient is currently with them during visit. The provider, Rutherford Guys, MD is located in their office at time of visit.  I discussed the limitations, risks, security and privacy concerns of performing an evaluation and management service by telephone and the availability of in person appointments. I also discussed with the patient that there may be a patient responsible charge related to this service. The patient expressed understanding and agreed to proceed.   CC: med refills  HPI ? Patient is a50 y.o.femalewith past medical history significant for ADD and GAD, asthma, UC, atrophic vaginitswho presents today for medication refills  Last OV Sept 2020 - telemedicine Changed symbicort to advair  She reports that she is overall doing well She reports her ADD is very well controlled On adderall XR 42m daily (sometimes wont take it on the weekend) and 120mIR prn in the afternoon, specially if working later shift She denies any side effects pmp reviewed  Her anxiety is well controlled on lexapro 1043mHer asthma is well controlled on advair She hardly ever uses albuterol  She has no acute concerns today  Allergies  Allergen Reactions  . Adderall Xr [Amphetamine-Dextroamphet Er] Diarrhea  . Benadryl [Diphenhydramine] Itching  . Neosporin [Neomycin-Bacitracin Zn-Polymyx] Rash    Prior to Admission medications   Medication Sig Start Date End Date Taking? Authorizing Provider  Albuterol Sulfate (PROAIR RESPICLICK) [id number] Base) MCG/ACT AEPB Inhale 1 puff into the lungs as needed. 07/10/19  Yes San[place]utherford GuysD  amphetamine-dextroamphetamine (ADDERALL XR) 30 MG 24 hr capsule Take 1 capsule (30 mg total) by mouth daily. 08/27/19  Yes SanRutherford GuysD    amphetamine-dextroamphetamine (ADDERALL XR) 30 MG 24 hr capsule Take 1 capsule (30 mg total) by mouth daily. 08/27/19  Yes SanRutherford GuysD  amphetamine-dextroamphetamine (ADDERALL XR) 30 MG 24 hr capsule Take 1 capsule (30 mg total) by mouth daily. 08/27/19  Yes SanRutherford GuysD  amphetamine-dextroamphetamine (ADDERALL) 10 MG tablet Take 1 tablet (10 mg total) by mouth daily as needed. 08/27/19  Yes SanRutherford GuysD  Calcium Carb-Cholecalciferol (CALCIUM 500 + D3 PO) Take by mouth daily.   Yes [provider]  escitalopram (LEXAPRO) 10 MG tablet TAKE 1 TABLET BY MOUTH EVERY DAY 10/02/19  Yes SanRutherford GuysD  Estradiol (YUVAFEM) 10 MCG TABS vaginal tablet PLACE 1 TABLET (10 MCG TOTAL) VAGINALLY 2 (TWO) TIMES A WEEK. 07/10/19  Yes SanRutherford GuysD  fexofenadine (ALLEGRA) 180 MG tablet Take 1 tablet (180 mg total) by mouth daily. 07/10/19  Yes SanRutherford GuysD  fluocinolone (VANOS) 0.01 % cream Apply topically 2 (two) times daily. 07/24/19  Yes SanRutherford GuysD  fluticasone (FLONASE) 50 MCG/ACT nasal spray PLACE 2 SPRAYS IN THE NOSE ONCE DAILY AS DIRECTED 08/05/19  Yes SanRutherford GuysD  Fluticasone-Salmeterol (ADVAIR DISKUS) 250-50 MCG/DOSE AEPB Inhale 1 puff into the lungs 2 (two) times daily. 07/10/19  Yes SanRutherford GuysD  Multiple Vitamins-Minerals (CENTRUM SILVER PO) Take 1 tablet by mouth every morning.    Yes [provider]  acetaminophen (TYLENOL) 500 MG tablet Take 500 mg by mouth every 6 (six) hours as needed for mild pain.     [provider]  Adalimumab (HUMIRA PEN  Eva) Inject into the skin every 21 ( twenty-one) days.    [provider]  Ferrous Sulfate (IRON) 28 MG TABS Take 1 tablet by mouth every morning.     [provider]  raloxifene (EVISTA) 60 MG tablet Take 60 mg by mouth daily. 11/23/19   [provider]  Vitamin D, Ergocalciferol, (DRISDOL) 1.25 MG (50000 UNIT) CAPS capsule Take 50,000 Units by  mouth 2 (two) times a week. 11/17/19   [provider]    Past Medical History:  Diagnosis Date  . Allergy   . Anemia   . Anxiety   . Asthma   . Hyperlipidemia   . Macrocytosis without anemia 06/01/2014  . Thrombocytosis ([place]) 06/01/2014  . Ulcerative colitis confined to rectum [facility])     Past Surgical History:  Procedure Laterality Date  . COLONOSCOPY W/ BIOPSIES    . COLONOSCOPY WITH PROPOFOL N/A 07/07/2014   Procedure: COLONOSCOPY WITH PROPOFOL;  Surgeon: Garlan Fair, MD;  Location: [facility];  Service: Endoscopy;  Laterality: N/A;  . NASAL SEPTUM SURGERY    . TONSILLECTOMY      Social History   Tobacco Use  . Smoking status: Former Research scientist (life sciences)  . Smokeless tobacco: Never Used  Substance Use Topics  . Alcohol use: Yes    Comment: 5-7 nights a week     Family History  Problem Relation Age of Onset  . Cancer Paternal Grandfather        lung ca  . CAD Father        Cabg at age 47  . CAD Mother        MI at age 20  . Stroke Maternal Grandfather   . Cancer Maternal Grandfather   . Heart disease Paternal Grandmother     ROS Per hpi  Objective  Vitals as reported by the patient: none   ASSESSMENT and PLAN  1. Attention deficit disorder, unspecified hyperactivity presence Controlled. Continue current regime. meds to be refilled 3 months at a time  2. GAD (generalized anxiety disorder) Controlled. Continue current regime.  - escitalopram (LEXAPRO) 10 MG tablet; Take 1 tablet (10 mg total) by mouth daily.  3. Seasonal allergies - fexofenadine (ALLEGRA) 180 MG tablet; Take 1 tablet (180 mg total) by mouth daily.  Other orders - amphetamine-dextroamphetamine (ADDERALL XR) 30 MG 24 hr capsule; Take 1 capsule (30 mg total) by mouth daily. - amphetamine-dextroamphetamine (ADDERALL) 10 MG tablet; Take 1 tablet (10 mg total) by mouth daily as needed. - Vitamin D, Ergocalciferol, (DRISDOL) 1.25 MG (50000 UNIT) CAPS capsule; Take 50,000 Units by mouth 2  (two) times a week. - raloxifene (EVISTA) 60 MG tablet; Take 60 mg by mouth daily. - amphetamine-dextroamphetamine (ADDERALL XR) 30 MG 24 hr capsule; Take 1 capsule (30 mg total) by mouth daily. - amphetamine-dextroamphetamine (ADDERALL XR) 30 MG 24 hr capsule; Take 1 capsule (30 mg total) by mouth daily. - amphetamine-dextroamphetamine (ADDERALL) 10 MG tablet; Take 1 tablet (10 mg total) by mouth daily as needed. - amphetamine-dextroamphetamine (ADDERALL) 10 MG tablet; Take 1 tablet (10 mg total) by mouth daily as needed  FOLLOW-UP: 6 months, call in 3 months for refills   The above assessment and management plan was discussed with the patient. The patient verbalized understanding of and has agreed to the management plan. Patient is aware to call the clinic if symptoms persist or worsen. Patient is aware when to return to the clinic for a follow-up visit. Patient educated on when it is appropriate to  go to the emergency department.    I provided 11 minutes of non-face-to-face time during this encounter.  Rutherford Guys, MD Primary Care at [facility] [place], [place] [address] Ph.  [phone] Fax [phone]

## 2019-12-11 NOTE — Progress Notes (Signed)
pt needs a refill on her ADHD, anxiety, and alergy medication. pt states no changes to her ADHD. pt also states medications are working well with no side effects. pt didn't have time to go through GAD-7. Pt stated she wants her Allegra, and Lexapro sent to [facility].

## 2019-12-11 NOTE — Patient Instructions (Signed)
° ° ° °  If you have lab work done today you will be contacted with your lab results within the next 2 weeks.  If you have not heard from us then please contact us. The fastest way to get your results is to register for My Chart. ° ° °IF you received an x-ray today, you will receive an invoice from [facility]. Please contact [facility] at [phone] with questions or concerns regarding your invoice.  ° °IF you received labwork today, you will receive an invoice from [facility]. Please contact [facility] at [phone] with questions or concerns regarding your invoice.  ° °Our billing staff will not be able to assist you with questions regarding bills from these companies. ° °You will be contacted with the lab results as soon as they are available. The fastest way to get your results is to activate your My Chart account. Instructions are located on the last page of this paperwork. If you have not heard from us regarding the results in 2 weeks, please contact this office. °  ° ° ° °

## 2019-12-13 ENCOUNTER — Ambulatory Visit: Payer: BC Managed Care – PPO | Attending: Internal Medicine

## 2019-12-13 DIAGNOSIS — Z23 Encounter for immunization: Secondary | ICD-10-CM | POA: Insufficient documentation

## 2019-12-13 NOTE — Progress Notes (Signed)
   Covid-19 Vaccination Clinic  Name:  Anita Rice    MRN: [id number] DOB: 1955/11/20  12/13/2019  [name] was observed post Covid-19 immunization for 15 minutes without incidence. She was provided with Vaccine Information Sheet and instruction to access the V-Safe system.   [name] was instructed to call 911 with any severe reactions post vaccine: Marland Kitchen Difficulty breathing  . Swelling of your face and throat  . A fast heartbeat  . A bad rash all over your body  . Dizziness and weakness    Immunizations Administered    Name Date Dose VIS Date Route   Pfizer COVID-19 Vaccine 12/13/2019  5:36 PM 0.3 mL 09/26/2019 Intramuscular   Manufacturer: [place]   Lot: [id number]   [place]: [id number]

## 2020-01-03 ENCOUNTER — Ambulatory Visit: Payer: BC Managed Care – PPO | Attending: Internal Medicine

## 2020-01-03 DIAGNOSIS — Z23 Encounter for immunization: Secondary | ICD-10-CM

## 2020-01-03 NOTE — Progress Notes (Signed)
   Covid-19 Vaccination Clinic  Name:  Anita Rice    MRN: [id number] DOB: 08/20/1956  01/03/2020  [name] was observed post Covid-19 immunization for 15 minutes without incident. She was provided with Vaccine Information Sheet and instruction to access the V-Safe system.   [name] was instructed to call 911 with any severe reactions post vaccine: Marland Kitchen Difficulty breathing  . Swelling of face and throat  . A fast heartbeat  . A bad rash all over body  . Dizziness and weakness   Immunizations Administered    Name Date Dose VIS Date Route   Pfizer COVID-19 Vaccine 01/03/2020  9:33 AM 0.3 mL 09/26/2019 Intramuscular   Manufacturer: [place]   Lot: [id number]   [place]: [address]-2

## 2020-02-17 NOTE — Telephone Encounter (Signed)
No action required.

## 2020-02-23 DIAGNOSIS — T63441A Toxic effect of venom of bees, accidental (unintentional), initial encounter: Secondary | ICD-10-CM | POA: Diagnosis not present

## 2020-02-23 DIAGNOSIS — L03113 Cellulitis of right upper limb: Secondary | ICD-10-CM | POA: Diagnosis not present

## 2020-03-18 ENCOUNTER — Other Ambulatory Visit: Payer: Self-pay | Admitting: Family Medicine

## 2020-03-18 DIAGNOSIS — F411 Generalized anxiety disorder: Secondary | ICD-10-CM

## 2020-03-24 ENCOUNTER — Other Ambulatory Visit: Payer: Self-pay | Admitting: Family Medicine

## 2020-03-24 ENCOUNTER — Encounter: Payer: Self-pay | Admitting: Family Medicine

## 2020-03-25 MED ORDER — AMPHETAMINE-DEXTROAMPHET ER 30 MG PO CP24: 30.0000 mg | ORAL_CAPSULE | Freq: Every day | ORAL | 0 refills | Status: AC

## 2020-03-25 MED ORDER — AMPHETAMINE-DEXTROAMPHETAMINE 10 MG PO TABS: 10.0000 mg | ORAL_TABLET | Freq: Every day | ORAL | 0 refills | Status: AC | PRN

## 2020-03-25 NOTE — Telephone Encounter (Signed)
Please schedule patient an f/u app for med refills (ADD)

## 2020-03-25 NOTE — Telephone Encounter (Signed)
pmp reviewed Med refilled Due for OV Aug 2021

## 2020-03-25 NOTE — Telephone Encounter (Signed)
03/25/2020 - PATIENT REQUESTED A REFILL ON HER ADDERALL XR 30 MG. I RECEIVED A RX REQUEST TO SCHEDULE THIS PATIENT A FOLLOW-UP APPOINTMENT WITH DR. Benay Spice. I TRIED TO CALL AND SCHEDULE BUT HAD TO LEAVE HER A VOICE MAIL TO RETURN MY CALL. IN THE MEANTIME HER MEDICATION HAS BEEN APPROVED. [place]

## 2020-03-25 NOTE — Telephone Encounter (Signed)
Patient is requesting a refill of the following medications: Requested Prescriptions   Pending Prescriptions Disp Refills   amphetamine-dextroamphetamine (ADDERALL XR) 30 MG 24 hr capsule 30 capsule 0    Sig: Take 1 capsule (30 mg total) by mouth daily.    Date of patient request: 03/25/20 Last office visit: 12/11/19 Date of last refill: 12/11/19 Last refill amount: 30x3 Follow up time period per chart:

## 2020-04-23 IMAGING — MG DIGITAL SCREENING BILAT W/ TOMO W/ CAD
8 series · 9 of 24 positions shown · non-contrast
Comparison: Previous exam(s).

CLINICAL DATA: Screening.

EXAM:
DIGITAL SCREENING BILATERAL MAMMOGRAM WITH TOMO AND CAD

[L MLO synth-2D]
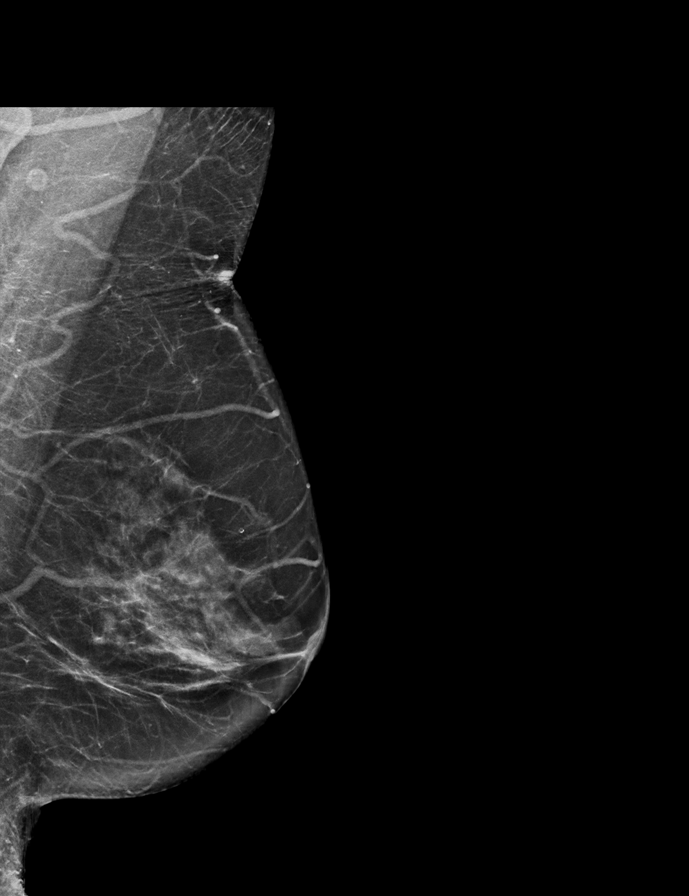

[R MLO synth-2D]
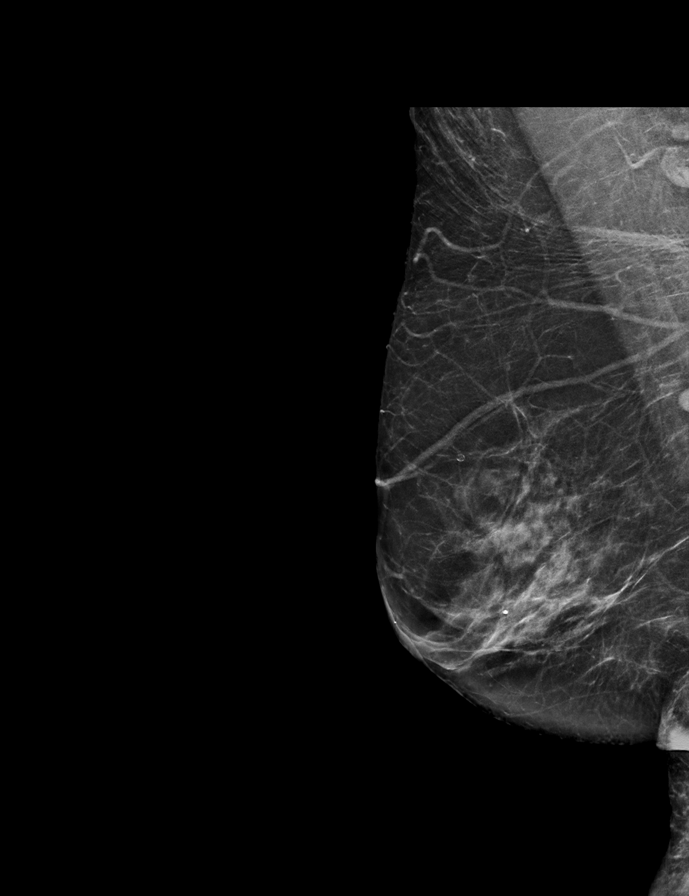

[R CC synth-2D]
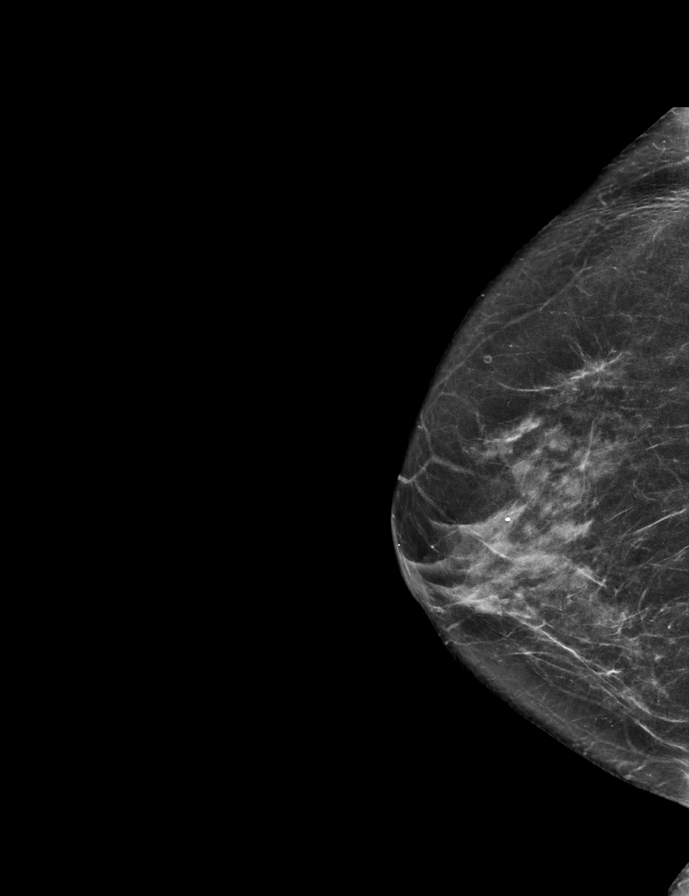

[L CC synth-2D]
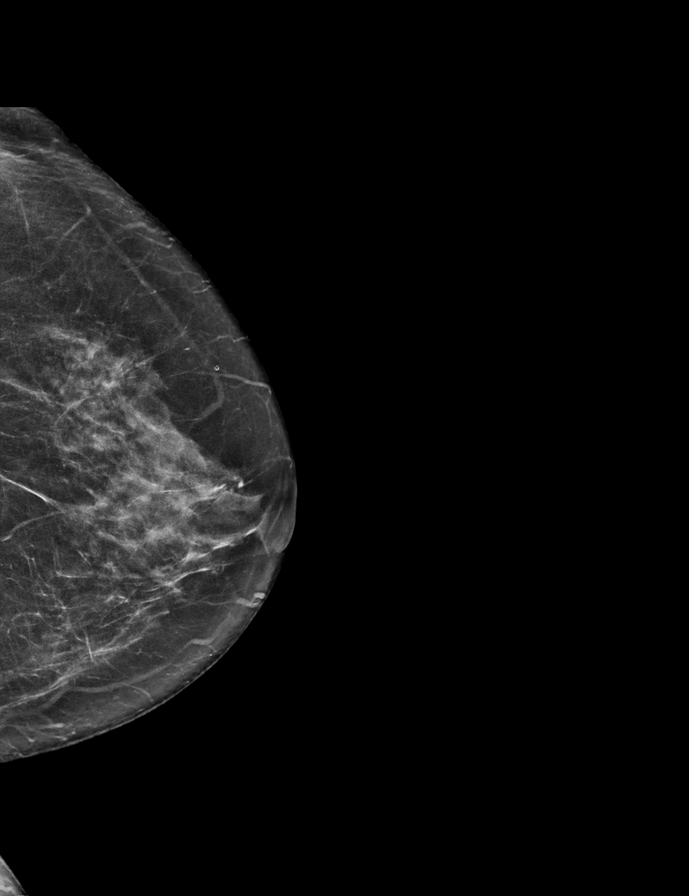

[R MLO tomo · 2 of 60 frames shown]
[frame 20/60]
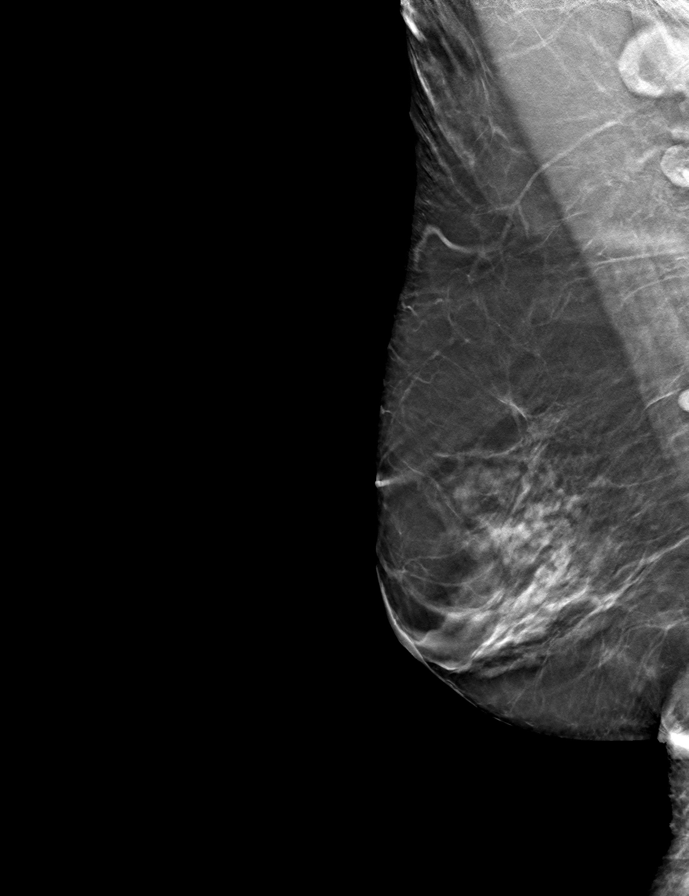
[frame 31/60]
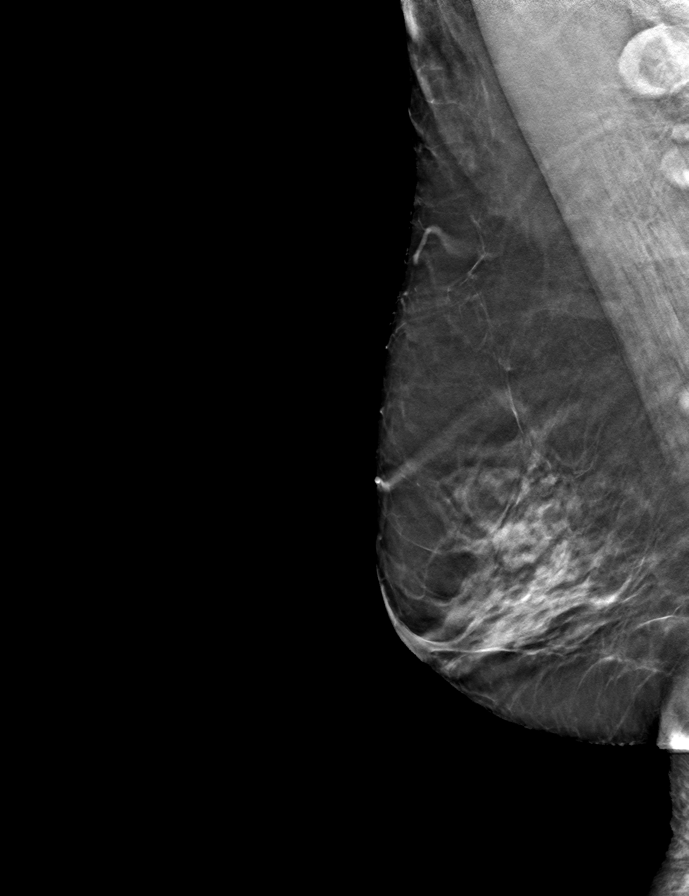

[R CC tomo · tomo slice 29/56.0]
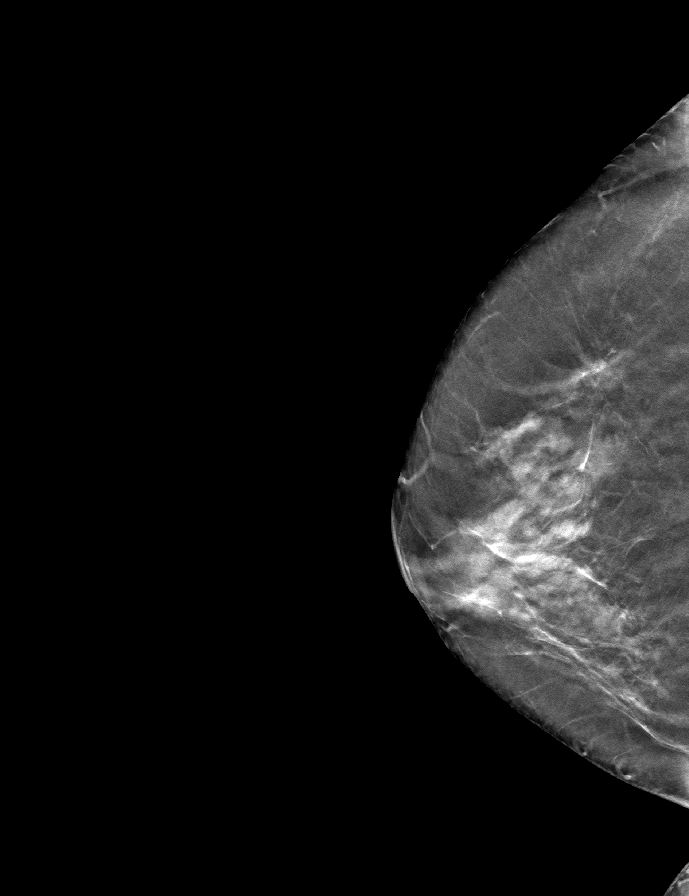

[L CC tomo · tomo slice 29/57.0]
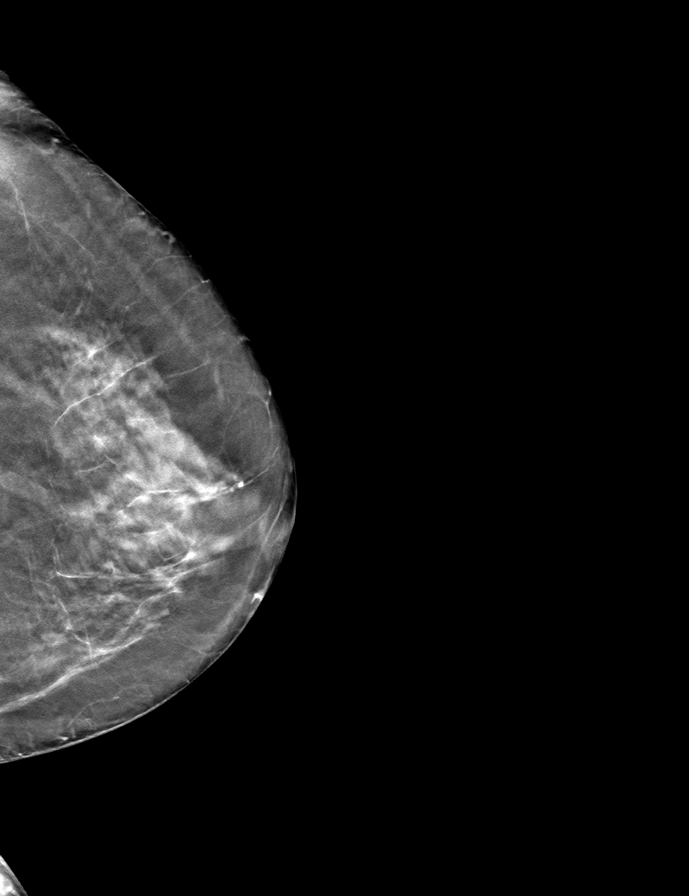

[L MLO tomo · tomo slice 32/63.0]
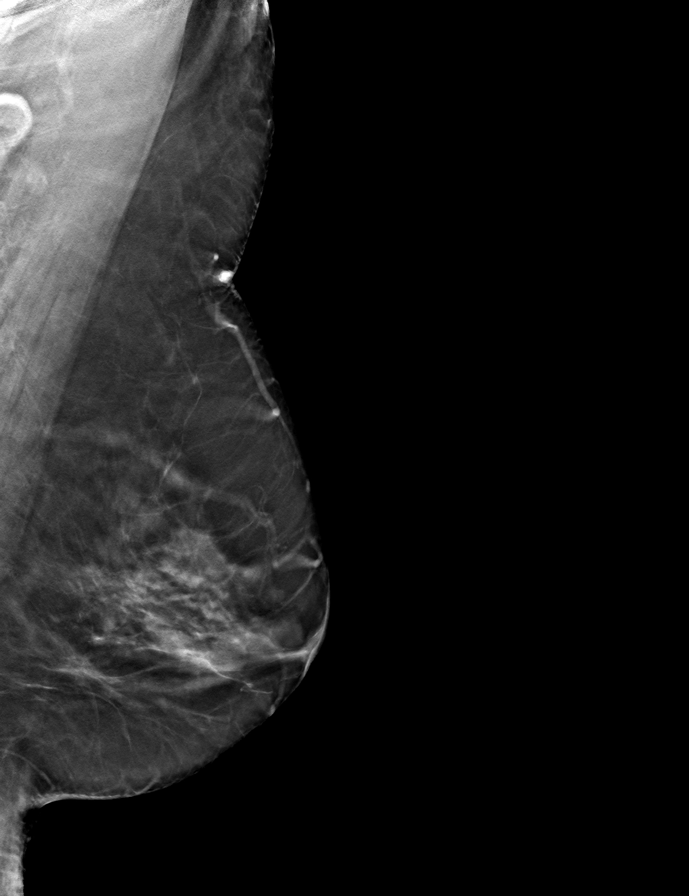

[9 of 24 positions shown; findings below may reference images not displayed]

ACR Breast Density Category c: The breast tissue is heterogeneously
dense, which may obscure small masses.
FINDINGS: There are no findings suspicious for malignancy. Images were
processed with CAD.
IMPRESSION: No mammographic evidence of malignancy. A result letter of this
screening mammogram will be mailed directly to the patient.

RECOMMENDATION:
Screening mammogram in one year. (Code:[id number])

BI-RADS CATEGORY  1: Negative.

## 2020-06-20 ENCOUNTER — Other Ambulatory Visit: Payer: Self-pay | Admitting: Family Medicine

## 2020-06-20 NOTE — Telephone Encounter (Signed)
Requested Prescriptions  Pending Prescriptions Disp Refills   fluocinolone (VANOS) 0.01 % cream [Pharmacy Med Name: FLUOCINOLONE 0.01% CREAM] 60 g 1    Sig: APPLY TO AFFECTED AREA TWICE A DAY     Dermatology:  Corticosteroids Passed - 06/20/2020  1:41 PM      Passed - Valid encounter within last 12 months    Recent Outpatient Visits          6 months ago Attention deficit disorder, unspecified hyperactivity presence   [facility], Lilia Argue, MD   11 months ago Vaginal atrophy   [facility], Lilia Argue, MD   1 year ago Attention deficit disorder, unspecified hyperactivity presence   [facility], Delfino Lovett, NP   1 year ago Attention deficit disorder, unspecified hyperactivity presence   [facility], Lilia Argue, MD   1 year ago Attention deficit disorder, unspecified hyperactivity presence   [facility] at [facility], Carroll Sage, FNP

## 2020-07-05 ENCOUNTER — Other Ambulatory Visit: Payer: Self-pay | Admitting: Family Medicine

## 2020-07-05 DIAGNOSIS — F411 Generalized anxiety disorder: Secondary | ICD-10-CM

## 2020-07-05 NOTE — Telephone Encounter (Signed)
Requested medication (s) are due for refill today: no  Requested medication (s) are on the active medication list:  yes  Last refill:  05/29/2020  Future visit scheduled: no  Notes to clinic:  due for follow up appointment    Requested Prescriptions  Pending Prescriptions Disp Refills   escitalopram (LEXAPRO) 10 MG tablet [Pharmacy Med Name: ESCITALOPRAM 10 MG TABLET] 30 tablet 2    Sig: TAKE 1 [place]      Psychiatry:  Antidepressants - SSRI Failed - 07/05/2020  1:49 AM      Failed - Valid encounter within last 6 months    Recent Outpatient Visits           6 months ago Attention deficit disorder, unspecified hyperactivity presence   [facility], Lilia Argue, MD   12 months ago Vaginal atrophy   [facility], Lilia Argue, MD   1 year ago Attention deficit disorder, unspecified hyperactivity presence   [facility], Delfino Lovett, NP   1 year ago Attention deficit disorder, unspecified hyperactivity presence   [facility], Lilia Argue, MD   1 year ago Attention deficit disorder, unspecified hyperactivity presence   Primary Care at [facility], Carroll Sage, FNP

## 2020-07-23 ENCOUNTER — Other Ambulatory Visit: Payer: Self-pay | Admitting: Family Medicine

## 2020-07-23 NOTE — Telephone Encounter (Signed)
Copied from [place] [id number]. Topic: Quick Communication - Rx Refill/Question >> Jul 23, 2020  8:46 AM Leward Quan A wrote: Medication: amphetamine-dextroamphetamine (ADDERALL) 10 MG tablet Completely out   Has the patient contacted their pharmacy? Yes.   (Agent: If no, request that the patient contact the pharmacy for the refill.) (Agent: If yes, when and what did the pharmacy advise?)  Preferred Pharmacy (with phone number or street name): [id number] [place] Phone:  [phone]:  [phone]    Agent: Please be advised that RX refills may take up to 3 business days. We ask that you follow-up with your pharmacy.

## 2020-07-23 NOTE — Telephone Encounter (Signed)
Requested medication (s) are due for refill today: yes  Requested medication (s) are on the active medication list: yes  Last refill: 12/11/19  #30  0 refills  Future visit scheduled:No Needs OV  Notes to clinic:  Medication not delegated. Patient needs drug screen and OV last 12/11/19 per protocol    Requested Prescriptions  Pending Prescriptions Disp Refills   amphetamine-dextroamphetamine (ADDERALL) 10 MG tablet 30 tablet 0    Sig: Take 1 tablet (10 mg total) by mouth daily as needed.      Not Delegated - Psychiatry:  Stimulants/ADHD Failed - 07/23/2020  8:51 AM      Failed - This refill cannot be delegated      Failed - Urine Drug Screen completed in last 360 days.      Failed - Valid encounter within last 3 months    Recent Outpatient Visits           7 months ago Attention deficit disorder, unspecified hyperactivity presence   [facility], Lilia Argue, MD   1 year ago Vaginal atrophy   [facility], Lilia Argue, MD   1 year ago Attention deficit disorder, unspecified hyperactivity presence   [facility], Delfino Lovett, NP   1 year ago Attention deficit disorder, unspecified hyperactivity presence   [facility], Lilia Argue, MD   1 year ago Attention deficit disorder, unspecified hyperactivity presence   Primary Care at [facility], Carroll Sage, FNP

## 2020-08-23 DIAGNOSIS — L209 Atopic dermatitis, unspecified: Secondary | ICD-10-CM | POA: Diagnosis not present

## 2020-08-23 DIAGNOSIS — F419 Anxiety disorder, unspecified: Secondary | ICD-10-CM | POA: Diagnosis not present

## 2020-08-23 DIAGNOSIS — N952 Postmenopausal atrophic vaginitis: Secondary | ICD-10-CM | POA: Diagnosis not present

## 2020-08-23 DIAGNOSIS — F900 Attention-deficit hyperactivity disorder, predominantly inattentive type: Secondary | ICD-10-CM | POA: Diagnosis not present

## 2020-10-05 DIAGNOSIS — Z13220 Encounter for screening for lipoid disorders: Secondary | ICD-10-CM | POA: Diagnosis not present

## 2020-10-05 DIAGNOSIS — Z0000 Encounter for general adult medical examination without abnormal findings: Secondary | ICD-10-CM | POA: Diagnosis not present

## 2020-10-12 ENCOUNTER — Other Ambulatory Visit: Payer: Self-pay | Admitting: Physician Assistant

## 2020-10-12 DIAGNOSIS — N6320 Unspecified lump in the left breast, unspecified quadrant: Secondary | ICD-10-CM

## 2021-01-12 DIAGNOSIS — I8312 Varicose veins of left lower extremity with inflammation: Secondary | ICD-10-CM | POA: Diagnosis not present

## 2021-01-12 DIAGNOSIS — I8311 Varicose veins of right lower extremity with inflammation: Secondary | ICD-10-CM | POA: Diagnosis not present

## 2021-02-15 ENCOUNTER — Other Ambulatory Visit: Payer: Self-pay | Admitting: Physician Assistant

## 2021-02-15 DIAGNOSIS — N63 Unspecified lump in unspecified breast: Secondary | ICD-10-CM

## 2021-03-07 DIAGNOSIS — S81802A Unspecified open wound, left lower leg, initial encounter: Secondary | ICD-10-CM | POA: Diagnosis not present

## 2021-03-07 DIAGNOSIS — Z23 Encounter for immunization: Secondary | ICD-10-CM | POA: Diagnosis not present

## 2021-03-11 DIAGNOSIS — N952 Postmenopausal atrophic vaginitis: Secondary | ICD-10-CM | POA: Diagnosis not present

## 2021-03-11 DIAGNOSIS — L03116 Cellulitis of left lower limb: Secondary | ICD-10-CM | POA: Diagnosis not present

## 2021-03-11 DIAGNOSIS — F900 Attention-deficit hyperactivity disorder, predominantly inattentive type: Secondary | ICD-10-CM | POA: Diagnosis not present

## 2021-03-17 DIAGNOSIS — L03116 Cellulitis of left lower limb: Secondary | ICD-10-CM | POA: Diagnosis not present

## 2021-03-25 ENCOUNTER — Other Ambulatory Visit: Payer: Self-pay

## 2021-03-25 ENCOUNTER — Ambulatory Visit
Admission: RE | Admit: 2021-03-25 | Discharge: 2021-03-25 | Disposition: A | Discharge status: Home / Self Care | Payer: BC Managed Care – PPO | Source: Ambulatory Visit | Attending: Physician Assistant | Admitting: Physician Assistant

## 2021-03-25 DIAGNOSIS — N63 Unspecified lump in unspecified breast: Secondary | ICD-10-CM

## 2021-03-25 DIAGNOSIS — N644 Mastodynia: Secondary | ICD-10-CM | POA: Diagnosis not present

## 2021-04-22 ENCOUNTER — Encounter (HOSPITAL_BASED_OUTPATIENT_CLINIC_OR_DEPARTMENT_OTHER): Payer: BC Managed Care – PPO | Attending: Internal Medicine | Admitting: Internal Medicine

## 2021-04-22 ENCOUNTER — Other Ambulatory Visit: Payer: Self-pay

## 2021-04-22 DIAGNOSIS — S81802A Unspecified open wound, left lower leg, initial encounter: Secondary | ICD-10-CM | POA: Diagnosis not present

## 2021-04-22 DIAGNOSIS — F988 Other specified behavioral and emotional disorders with onset usually occurring in childhood and adolescence: Secondary | ICD-10-CM | POA: Insufficient documentation

## 2021-04-22 DIAGNOSIS — W228XXA Striking against or struck by other objects, initial encounter: Secondary | ICD-10-CM | POA: Diagnosis not present

## 2021-04-26 NOTE — Progress Notes (Signed)
BREEONA, WAID ([id number]) Visit Report for 04/22/2021 Allergy List Details Patient Name: Date of Service: Anita Rice [name] 04/22/2021 9:00 A M Medical Record Number: [id number] Patient Account Number: [id number] Date of Birth/Sex: Treating RN: Feb 25, 1956 (65 y.o. Female) Lorrin Jackson Primary Care Tashi Band: Hiram Comber Other Clinician: Referring Lanier Felty: Treating Ariele Vidrio/Extender: Arletta Bale, Irma Weeks in Treatment: 0 Allergies Active Allergies Neosporin (neo-bac-polym) Benadryl Allergy Notes Electronic Signature(s) Signed: 04/26/2021 5:43:47 PM By: Levan Hurst RN, BSN Entered By: Levan Hurst on 04/22/2021 10:04:00 -------------------------------------------------------------------------------- Arrival Information Details Patient Name: Date of Service: Anita Rice, Anita Rice [name] 04/22/2021 9:00 A M Medical Record Number: [id number] Patient Account Number: [id number] Date of Birth/Sex: Treating RN: 1956-02-12 (65 y.o. Female) Levan Hurst Primary Care Kazuko Clemence: Hiram Comber Other Clinician: Referring Lucerito Rosinski: Treating Caedon Bond/Extender: Harlene Salts Weeks in Treatment: 0 Visit Information Patient Arrived: Ambulatory Arrival Time: 10:03 Accompanied By: alone Transfer Assistance: None Patient Identification Verified: Yes Secondary Verification Process Completed: Yes Patient Requires Transmission-Based Precautions: No Patient Has Alerts: No Electronic Signature(s) Signed: 04/26/2021 5:43:47 PM By: Levan Hurst RN, BSN Entered By: Levan Hurst on 04/22/2021 10:03:19 -------------------------------------------------------------------------------- Clinic Level of Care Assessment Details Patient Name: Date of Service: Anita Rice [name] 04/22/2021 9:00 A M Medical Record Number: [id number] Patient Account Number: [id number] Date of Birth/Sex: Treating RN: 12-Jul-1956 (65 y.o. Female) Levan Hurst Primary Care Antinio Sanderfer:  Hiram Comber Other Clinician: Referring Durwin Davisson: Treating Jeanclaude Wentworth/Extender: Harlene Salts Weeks in Treatment: 0 Clinic Level of Care Assessment Items TOOL 1 Quantity Score X- 1 0 Use when EandM and Procedure is performed on INITIAL visit ASSESSMENTS - Nursing Assessment / Reassessment X- 1 20 General Physical Exam (combine w/ comprehensive assessment (listed just below) when performed on new pt. evals) X- 1 25 Comprehensive Assessment (HX, ROS, Risk Assessments, Wounds Hx, etc.) ASSESSMENTS - Wound and Skin Assessment / Reassessment []  - 0 Dermatologic / Skin Assessment (not related to wound area) ASSESSMENTS - Ostomy and/or Continence Assessment and Care []  - 0 Incontinence Assessment and Management []  - 0 Ostomy Care Assessment and Management (repouching, etc.) PROCESS - Coordination of Care X - Simple Patient / Family Education for ongoing care 1 15 []  - 0 Complex (extensive) Patient / Family Education for ongoing care X- 1 10 Staff obtains Programmer, systems, Records, T Results / Process Orders est []  - 0 Staff telephones HHA, Nursing Homes / Clarify orders / etc []  - 0 Routine Transfer to another Facility (non-emergent condition) []  - 0 Routine Hospital Admission (non-emergent condition) X- 1 15 New Admissions / Biomedical engineer / Ordering NPWT Apligraf, etc. , []  - 0 Emergency Hospital Admission (emergent condition) PROCESS - Special Needs []  - 0 Pediatric / Minor Patient Management []  - 0 Isolation Patient Management []  - 0 Hearing / Language / Visual special needs []  - 0 Assessment of Community assistance (transportation, D/C planning, etc.) []  - 0 Additional assistance / Altered mentation []  - 0 Support Surface(s) Assessment (bed, cushion, seat, etc.) INTERVENTIONS - Miscellaneous []  - 0 External ear exam []  - 0 Patient Transfer (multiple staff / Civil Service fast streamer / Similar devices) []  - 0 Simple Staple / Suture removal (25 or  less) []  - 0 Complex Staple / Suture removal (26 or more) []  - 0 Hypo/Hyperglycemic Management (do not check if billed separately) X- 1 15 Ankle / Brachial Index (ABI) - do not check if billed separately Has the patient been seen at the hospital within the last three years: Yes Total Score: 100  Level Of Care: New/Established - Level 3 Electronic Signature(s) Signed: 04/26/2021 5:43:47 PM By: Levan Hurst RN, BSN Entered By: Levan Hurst on 04/22/2021 11:35:49 -------------------------------------------------------------------------------- Encounter Discharge Information Details Patient Name: Date of Service: Anita Rice, Anita Rice [name] 04/22/2021 9:00 A M Medical Record Number: [id number] Patient Account Number: [id number] Date of Birth/Sex: Treating RN: 1955-11-20 (65 y.o. Female) Deon Pilling Primary Care Waddell Iten: Hiram Comber Other Clinician: Referring Williamson Cavanah: Treating Kaitrin Seybold/Extender: Harlene Salts Weeks in Treatment: 0 Encounter Discharge Information Items Post Procedure Vitals Discharge Condition: Stable Temperature (F): 98.3 Ambulatory Status: Ambulatory Pulse (bpm): 99 Discharge Destination: Home Respiratory Rate (breaths/min): 18 Transportation: Private Auto Blood Pressure (mmHg): 114/77 Accompanied By: self Schedule Follow-up Appointment: Yes Clinical Summary of Care: Electronic Signature(s) Signed: 04/22/2021 5:32:25 PM By: Deon Pilling Entered By: Deon Pilling on 04/22/2021 14:26:00 -------------------------------------------------------------------------------- Lower Extremity Assessment Details Patient Name: Date of Service: Anita Rice [name] 04/22/2021 9:00 A M Medical Record Number: [id number] Patient Account Number: [id number] Date of Birth/Sex: Treating RN: 11-Jun-1956 (65 y.o. Female) Levan Hurst Primary Care Imari Reen: Hiram Comber Other Clinician: Referring Anne Sebring: Treating Madoc Holquin/Extender: Harlene Salts Weeks in Treatment: 0 Edema Assessment Assessed: [Left: No] [Right: No] Edema: [Left: N] [Right: o] Calf Left: Right: Point of Measurement: 28 cm From Medial Instep 34 cm Ankle Left: Right: Point of Measurement: 9 cm From Medial Instep 19.5 cm Vascular Assessment Pulses: Dorsalis Pedis Palpable: [Left:Yes] Blood Pressure: Brachial: [Left:114] Ankle: [Left:Dorsalis Pedis: 110 0.96] Electronic Signature(s) Signed: 04/26/2021 5:43:47 PM By: Levan Hurst RN, BSN Entered By: Levan Hurst on 04/22/2021 10:13:31 -------------------------------------------------------------------------------- Multi Wound Chart Details Patient Name: Date of Service: Anita Rice, Anita Rice [name] 04/22/2021 9:00 A M Medical Record Number: [id number] Patient Account Number: [id number] Date of Birth/Sex: Treating RN: [birth year] (65 y.o. Female) Levan Hurst Primary Care Rosielee Corporan: Hiram Comber Other Clinician: Referring Jeramine Delis: Treating Yann Biehn/Extender: Arletta Bale, Irma Weeks in Treatment: 0 Vital Signs Height(in): 60 Pulse(bpm): 99 Weight(lbs): 118 Blood Pressure(mmHg): 114/77 Body Mass Index(BMI): 23 Temperature(F): 98.3 Respiratory Rate(breaths/min): 18 Photos: [N/A:N/A] Left, Anterior Lower Leg N/A N/A Wound Location: Trauma N/A N/A Wounding Event: Trauma, Other N/A N/A Primary Etiology: Chronic sinus problems/congestion, N/A N/A Comorbid History: Anemia, Asthma, Colitis 02/04/2021 N/A N/A Date Acquired: 0 N/A N/A Weeks of Treatment: Open N/A N/A Wound Status: 2.4x1.5x0.1 N/A N/A Measurements L x W x D (cm) 2.827 N/A N/A A (cm) : rea 0.283 N/A N/A Volume (cm) : 0.00% N/A N/A % Reduction in A rea: 0.00% N/A N/A % Reduction in Volume: Full Thickness Without Exposed N/A N/A Classification: Support Structures Medium N/A N/A Exudate A mount: Serosanguineous N/A N/A Exudate Type: red, brown N/A N/A Exudate Color: Flat  and Intact N/A N/A Wound Margin: Small (1-33%) N/A N/A Granulation A mount: Pink N/A N/A Granulation Quality: Large (67-100%) N/A N/A Necrotic A mount: Eschar N/A N/A Necrotic Tissue: Fat Layer (Subcutaneous Tissue): Yes N/A N/A Exposed Structures: Fascia: No Tendon: No Muscle: No Joint: No Bone: No None N/A N/A Epithelialization: Chemical/Enzymatic/Mechanical N/A N/A Debridement: Pre-procedure Verification/Time Out 10:45 N/A N/A Taken: N/A N/A N/A Instrument: None N/A N/A Bleeding: Procedure was tolerated well N/A N/A Debridement Treatment Response: 2.4x1.5x0.1 N/A N/A Post Debridement Measurements L x W x D (cm) 0.283 N/A N/A Post Debridement Volume: (cm) Debridement N/A N/A Procedures Performed: Treatment Notes Wound #1 (Lower Leg) Wound Laterality: Left, Anterior Cleanser Soap and Water Discharge Instruction: May shower and wash wound with dial antibacterial soap and water prior to dressing change. Peri-Wound Care  Skin Prep Discharge Instruction: Use skin prep as directed Topical Primary Dressing Santyl Ointment Discharge Instruction: Apply nickel thick amount to wound bed as instructed Secondary Dressing Bordered Gauze, 2x2 in Discharge Instruction: Apply over primary dressing as directed. Secured With Compression Wrap Compression Stockings Environmental education officer) Signed: 04/26/2021 1:40:23 PM By: Kalman Shan DO Signed: 04/26/2021 5:43:47 PM By: Levan Hurst RN, BSN Entered By: Kalman Shan on 04/26/2021 12:47:07 -------------------------------------------------------------------------------- [place] Details Patient Name: Date of Service: Anita Rice, Anita Rice [name] 04/22/2021 9:00 A M Medical Record Number: [id number] Patient Account Number: [id number] Date of Birth/Sex: Treating RN: Dec 27, 1955 (65 y.o. Female) Levan Hurst Primary Care Damarius Karnes: Hiram Comber Other Clinician: Referring Chrishon Martino: Treating  Ezequias Lard/Extender: Ilona Sorrel in Treatment: 0 Multidisciplinary Care Plan reviewed with physician Active Inactive Wound/Skin Impairment Nursing Diagnoses: Impaired tissue integrity Knowledge deficit related to ulceration/compromised skin integrity Goals: Patient/caregiver will verbalize understanding of skin care regimen Date Initiated: 04/22/2021 Target Resolution Date: 05/20/2021 Goal Status: Active Ulcer/skin breakdown will have a volume reduction of 30% by week 4 Date Initiated: 04/22/2021 Target Resolution Date: 05/20/2021 Goal Status: Active Interventions: Assess patient/caregiver ability to obtain necessary supplies Assess patient/caregiver ability to perform ulcer/skin care regimen upon admission and as needed Assess ulceration(s) every visit Provide education on ulcer and skin care Notes: Electronic Signature(s) Signed: 04/26/2021 5:43:47 PM By: Levan Hurst RN, BSN Entered By: Levan Hurst on 04/22/2021 11:35:05 -------------------------------------------------------------------------------- Pain Assessment Details Patient Name: Date of Service: Anita Rice [name] 04/22/2021 9:00 A M Medical Record Number: [id number] Patient Account Number: [id number] Date of Birth/Sex: Treating RN: Apr 28, 1956 (65 y.o. Female) Levan Hurst Primary Care Hoke Baer: Hiram Comber Other Clinician: Referring Nijah Orlich: Treating Eriverto Byrnes/Extender: Harlene Salts Weeks in Treatment: 0 Active Problems Location of Pain Severity and Description of Pain Patient Has Paino No Site Locations Pain Management and Medication Current Pain Management: Electronic Signature(s) Signed: 04/26/2021 5:43:47 PM By: Levan Hurst RN, BSN Entered By: Levan Hurst on 04/22/2021 10:14:51 -------------------------------------------------------------------------------- Patient/Caregiver Education Details Patient Name: Date of Service: Anita Rice  7/8/2022andnbsp9:00 A M Medical Record Number: [id number] Patient Account Number: [id number] Date of Birth/Gender: Treating RN: 07-08-1956 (66 y.o. Female) Levan Hurst Primary Care Physician: Hiram Comber Other Clinician: Referring Physician: Treating Physician/Extender: Ilona Sorrel in Treatment: 0 Education Assessment Education Provided To: Patient Education Topics Provided Wound/Skin Impairment: Methods: Explain/Verbal Responses: State content correctly Electronic Signature(s) Signed: 04/26/2021 5:43:47 PM By: Levan Hurst RN, BSN Entered By: Levan Hurst on 04/22/2021 11:35:17 -------------------------------------------------------------------------------- Wound Assessment Details Patient Name: Date of Service: Anita Rice [name] 04/22/2021 9:00 A M Medical Record Number: [id number] Patient Account Number: [id number] Date of Birth/Sex: Treating RN: 1956-08-20 (65 y.o. Female) Levan Hurst Primary Care Conni Knighton: Hiram Comber Other Clinician: Referring Sheralee Qazi: Treating Dinh Ayotte/Extender: Harlene Salts Weeks in Treatment: 0 Wound Status Wound Number: 1 Primary Etiology: Trauma, Other Wound Location: Left, Anterior Lower Leg Wound Status: Open Wounding Event: Trauma Comorbid Chronic sinus problems/congestion, Anemia, Asthma, History: Colitis Date Acquired: 02/04/2021 Weeks Of Treatment: 0 Clustered Wound: No Photos Wound Measurements Length: (cm) 2.4 Width: (cm) 1.5 Depth: (cm) 0.1 Area: (cm) 2.827 Volume: (cm) 0.283 % Reduction in Area: 0% % Reduction in Volume: 0% Epithelialization: None Tunneling: No Undermining: No Wound Description Classification: Full Thickness Without Exposed Support Structures Wound Margin: Flat and Intact Exudate Amount: Medium Exudate Type: Serosanguineous Exudate Color: red, brown Foul Odor After Cleansing: No Slough/Fibrino Yes Wound Bed Granulation  Amount: Small (1-33%) Exposed  Structure Granulation Quality: Pink Fascia Exposed: No Necrotic Amount: Large (67-100%) Fat Layer (Subcutaneous Tissue) Exposed: Yes Necrotic Quality: Eschar Tendon Exposed: No Muscle Exposed: No Joint Exposed: No Bone Exposed: No Treatment Notes Wound #1 (Lower Leg) Wound Laterality: Left, Anterior Cleanser Soap and Water Discharge Instruction: May shower and wash wound with dial antibacterial soap and water prior to dressing change. Peri-Wound Care Skin Prep Discharge Instruction: Use skin prep as directed Topical Primary Dressing Santyl Ointment Discharge Instruction: Apply nickel thick amount to wound bed as instructed Secondary Dressing Bordered Gauze, 2x2 in Discharge Instruction: Apply over primary dressing as directed. Secured With Compression Wrap Compression Stockings Environmental education officer) Signed: 04/22/2021 5:16:50 PM By: Sandre Kitty Signed: 04/26/2021 5:43:47 PM By: Levan Hurst RN, BSN Entered By: Sandre Kitty on 04/22/2021 16:53:19 -------------------------------------------------------------------------------- Vitals Details Patient Name: Date of Service: Anita Rice, Anita Rice [name] 04/22/2021 9:00 A M Medical Record Number: [id number] Patient Account Number: [id number] Date of Birth/Sex: Treating RN: 11/07/1955 (65 y.o. Female) Levan Hurst Primary Care Analyssa Downs: Hiram Comber Other Clinician: Referring Kyrin Garn: Treating Linde Wilensky/Extender: Harlene Salts Weeks in Treatment: 0 Vital Signs Time Taken: 10:03 Temperature (F): 98.3 Height (in): 60 Pulse (bpm): 99 Source: Stated Respiratory Rate (breaths/min): 18 Weight (lbs): 118 Blood Pressure (mmHg): 114/77 Source: Stated Reference Range: 80 - 120 mg / dl Body Mass Index (BMI): 23 Electronic Signature(s) Signed: 04/26/2021 5:43:47 PM By: Levan Hurst RN, BSN Entered By: Levan Hurst on 04/22/2021 10:03:52

## 2021-04-26 NOTE — Progress Notes (Signed)
Anita Rice ([id number]) Visit Report for 04/22/2021 Chief Complaint Document Details Patient Name: Date of Service: Anita Rice [name] 04/22/2021 9:00 A M Medical Record Number: [id number] Patient Account Number: [id number] Date of Birth/Sex: Treating RN: 10/07/1956 (65 y.o. Female) Levan Hurst Primary Care Provider: Hiram Comber Other Clinician: Referring Provider: Treating Provider/Extender: Harlene Salts Weeks in Treatment: 0 Information Obtained from: Patient Chief Complaint Left lower extremity wound Electronic Signature(s) Signed: 04/26/2021 1:40:23 PM By: Kalman Shan DO Entered By: Kalman Rice on 04/26/2021 12:47:27 -------------------------------------------------------------------------------- Debridement Details Patient Name: Date of Service: Anita Rice, Anita Brandy [name] 04/22/2021 9:00 A M Medical Record Number: [id number] Patient Account Number: [id number] Date of Birth/Sex: Treating RN: Feb 15, 1956 (65 y.o. Female) Levan Hurst Primary Care Provider: Hiram Comber Other Clinician: Referring Provider: Treating Provider/Extender: Harlene Salts Weeks in Treatment: 0 Debridement Performed for Assessment: Wound #1 Left,Anterior Lower Leg Performed By: Clinician Levan Hurst, RN Debridement Type: Chemical/Enzymatic/Mechanical Agent Used: Santyl Level of Consciousness (Pre-procedure): Awake and Alert Pre-procedure Verification/Time Out Yes - 10:45 Taken: Bleeding: None Response to Treatment: Procedure was tolerated well Level of Consciousness (Post- Awake and Alert procedure): Post Debridement Measurements of Total Wound Length: (cm) 2.4 Width: (cm) 1.5 Depth: (cm) 0.1 Volume: (cm) 0.283 Character of Wound/Ulcer Post Debridement: Requires Further Debridement Post Procedure Diagnosis Same as Pre-procedure Electronic Signature(s) Signed: 04/26/2021 1:40:23 PM By: Kalman Shan DO Signed: 04/26/2021 5:43:47 PM  By: Levan Hurst RN, BSN Entered By: Levan Hurst on 04/22/2021 10:49:33 -------------------------------------------------------------------------------- HPI Details Patient Name: Date of Service: Anita Rice, Anita Brandy [name] 04/22/2021 9:00 A M Medical Record Number: [id number] Patient Account Number: [id number] Date of Birth/Sex: Treating RN: 29-Aug-1956 (65 y.o. Female) Levan Hurst Primary Care Provider: Hiram Comber Other Clinician: Referring Provider: Treating Provider/Extender: Harlene Salts Weeks in Treatment: 0 History of Present Illness HPI Description: Admission 7/8 Anita Rice is a 65 year old female with a past medical history of ADD that presents to the clinic for a 2-1/74-monthhistory of nonhealing left lower extremity wound. She states that she [facility] [employer] [facility] and struck her leg on a metal sign. She has been using soap and water and Neosporin to the area. She now has a blackened scab over the area that has not improved. Over the course of the past 2 months she has been on 2 courses of antibiotics. She is currently not taking any antibiotics at this time. She Currently denies signs of infection. Electronic Signature(s) Signed: 04/26/2021 1:40:23 PM By: HKalman ShanDO Entered By: HKalman Shanon 04/26/2021 13:02:55 -------------------------------------------------------------------------------- Physical Exam Details Patient Name: Date of Service: LDorise BullionNA L. 04/22/2021 9:00 A M Medical Record Number: [id number] Account Number: [id number] of Birth/Sex: Treating RN: 909-09-1956(65y.o. Female) LLevan HurstPrimary Care Provider: SHiram ComberOther Clinician: Referring Provider: Treating Provider/Extender: HHarlene SaltsWeeks in Treatment: 0 Constitutional respirations regular, non-labored and within target range for patient..Marland KitchenPsychiatric pleasant and  cooperative. Notes Left lower extremity: T the anterior aspect there is a wound with eschar present. Post debridement there is adherent nonviable tissue. No signs of infection. o Electronic Signature(s) Signed: 04/26/2021 1:40:23 PM By: HKalman ShanDO Entered By: HKalman Shanon 04/26/2021 13:32:28 -------------------------------------------------------------------------------- Physician Orders Details Patient Name: Date of Service: LLenor Rice JGreggory BrandyNA L. 04/22/2021 9:00 A M Medical Record Number: [id number] Account Number: [id number] of Birth/Sex: Treating RN: [birth year](65y.o. Female) LLevan HurstPrimary Care Provider: SHiram Comber  Other Clinician: Referring Provider: Treating Provider/Extender: Harlene Salts Weeks in Treatment: 0 Verbal / Phone Orders: No Diagnosis Coding ICD-10 Coding Code Description S81.802A Unspecified open wound, left lower leg, initial encounter Follow-up Appointments ppointment in 1 week. - with Dr. Heber [place] Return A Wound Treatment Wound #1 - Lower Leg Wound Laterality: Left, Anterior Cleanser: Soap and Water 1 x Per Day Discharge Instructions: May shower and wash wound with dial antibacterial soap and water prior to dressing change. Peri-Wound Care: Skin Prep 1 x Per Day Discharge Instructions: Use skin prep as directed Prim Dressing: Santyl Ointment 1 x Per Day ary Discharge Instructions: Apply nickel thick amount to wound bed as instructed Secondary Dressing: Bordered Gauze, 2x2 in 1 x Per Day Discharge Instructions: Apply over primary dressing as directed. Patient Medications llergies: Neosporin (neo-bac-polym), Benadryl A Notifications Medication Indication Start End 04/22/2021 Santyl DOSE 1 - topical 250 unit/gram ointment - 1 ointment topical daily to wound Electronic Signature(s) Signed: 04/26/2021 1:40:23 PM By: Kalman Shan DO Entered By: Kalman Rice on 04/26/2021  13:37:29 Prescription 04/22/2021 -------------------------------------------------------------------------------- Boutin, Anita L. Kalman Shan DO Patient Name: Provider: [birth year] [id number] Date of Birth: NPI#: Female [id number] Sex: DEA #: [phone] Phone #: License #: [place] Patient Address: [place] [address] [place], [place] [address] [place], [place] [address] [phone] Allergies Neosporin (neo-bac-polym); Benadryl Medication Medication: Route: Strength: Form: Santyl 250 unit/gram topical ointment topical 250 unit/gram ointment Class: TOPICAL/MUCOUS MEMBR./SUBCUT ENZYMES . Dose: Frequency / Time: Indication: 1 1 ointment topical daily to wound Number of Refills: Number of Units: 1 Thirty (30) Generic Substitution: Start Date: End Date: One Time Use: Substitution Permitted 03/18/6947 No Note to Pharmacy: Manufacturer's Note: The total quantity has been calculated in grams due to Santyl being available in either 30 or 90 gram tubes. Hand Signature: Date(s): Electronic Signature(s) Signed: 04/26/2021 1:40:23 PM By: Kalman Shan DO Entered By: Kalman Rice on 04/26/2021 13:39:58 -------------------------------------------------------------------------------- Problem List Details Patient Name: Date of Service: Anita Rice, Anita Brandy [name] 04/22/2021 9:00 A M Medical Record Number: [id number] Patient Account Number: [id number] Date of Birth/Sex: Treating RN: 1956/04/04 (65 y.o. Female) Levan Hurst Primary Care Provider: Hiram Comber Other Clinician: Referring Provider: Treating Provider/Extender: Harlene Salts Weeks in Treatment: 0 Active Problems ICD-10 Encounter Code Description Active Date MDM Diagnosis S81.802A Unspecified open wound, left lower leg, initial encounter 04/22/2021 No Yes Inactive Problems Resolved Problems Electronic  Signature(s) Signed: 04/26/2021 1:40:23 PM By: Kalman Shan DO Entered By: Kalman Rice on 04/26/2021 12:46:06 -------------------------------------------------------------------------------- Progress Note Details Patient Name: Date of Service: Anita Rice [name] 04/22/2021 9:00 A M Medical Record Number: [id number] Patient Account Number: [id number] Date of Birth/Sex: Treating RN: 09-21-1956 (65 y.o. Female) Levan Hurst Primary Care Provider: Hiram Comber Other Clinician: Referring Provider: Treating Provider/Extender: Harlene Salts Weeks in Treatment: 0 Subjective Chief Complaint Information obtained from Patient Left lower extremity wound History of Present Illness (HPI) Admission 7/8 Ms. Anita Rice is a 65 year old female with a past medical history of ADD that presents to the clinic for a 2-1/15-monthhistory of nonhealing left lower extremity wound. She states that she was at [place] [employer] [place] and struck her leg on a metal sign. She has been using soap and water and Neosporin to the area. She now has a blackened scab over the area that has not improved. Over the course of the past 2 months she has been on 2  courses of antibiotics. She is currently not taking any antibiotics at this time. She Currently denies signs of infection. Patient History Information obtained from Patient. Allergies Neosporin (neo-bac-polym), Benadryl Family History Unknown History. Social History Never smoker, Marital Status - Married, Alcohol Use - Never, Drug Use - No History, Caffeine Use - Rarely. Medical History Ear/Nose/Mouth/Throat Patient has history of Chronic sinus problems/congestion Hematologic/Lymphatic Patient has history of Anemia Respiratory Patient has history of Asthma Gastrointestinal Patient has history of Colitis Medical A Surgical History Notes nd Hematologic/Lymphatic Thrombocytopenia 2016 Review of Systems  (ROS) Constitutional Symptoms (General Health) Denies complaints or symptoms of Fatigue, Fever, Chills, Marked Weight Change. Eyes Denies complaints or symptoms of Dry Eyes, Vision Changes, Glasses / Contacts. Ear/Nose/Mouth/Throat Denies complaints or symptoms of Chronic sinus problems or rhinitis. Respiratory Denies complaints or symptoms of Chronic or frequent coughs, Shortness of Breath. Cardiovascular Denies complaints or symptoms of Chest pain. Endocrine Denies complaints or symptoms of Heat/cold intolerance. Genitourinary Denies complaints or symptoms of Frequent urination. Integumentary (Skin) Complains or has symptoms of Wounds - wound left lower leg. Musculoskeletal Denies complaints or symptoms of Muscle Pain, Muscle Weakness. Neurologic Denies complaints or symptoms of Numbness/parasthesias. Psychiatric Denies complaints or symptoms of Claustrophobia, Suicidal. Objective Constitutional respirations regular, non-labored and within target range for patient.. Vitals Time Taken: 10:03 AM, Height: 60 in, Source: Stated, Weight: 118 lbs, Source: Stated, BMI: 23, Temperature: 98.3 F, Pulse: 99 bpm, Respiratory Rate: 18 breaths/min, Blood Pressure: 114/77 mmHg. Psychiatric pleasant and cooperative. General Notes: Left lower extremity: T the anterior aspect there is a wound with eschar present. Post debridement there is adherent nonviable tissue. No signs o of infection. Integumentary (Hair, Skin) Wound #1 status is Open. Original cause of wound was Trauma. The date acquired was: 02/04/2021. The wound is located on the Left,Anterior Lower Leg. The wound measures 2.4cm length x 1.5cm width x 0.1cm depth; 2.827cm^2 area and 0.283cm^3 volume. There is Fat Layer (Subcutaneous Tissue) exposed. There is no tunneling or undermining noted. There is a medium amount of serosanguineous drainage noted. The wound margin is flat and intact. There is small (1-33%) pink granulation within  the wound bed. There is a large (67-100%) amount of necrotic tissue within the wound bed including Eschar. Assessment Active Problems ICD-10 Unspecified open wound, left lower leg, initial encounter Patient presents with a 2-1/2 month history of nonhealing trauma wound. I was able to remove the eschar in clinic and debride the area however there is is still adherent nonviable tissue present. I recommended using Santyl to the wound bed. No signs of infection. Procedures Wound #1 Pre-procedure diagnosis of Wound #1 is a Trauma, Other located on the Left,Anterior Lower Leg . There was a Chemical/Enzymatic/Mechanical debridement performed by Levan Hurst, RN.Marland Kitchen Agent used was [employer]. A time out was conducted at 10:45, prior to the start of the procedure. There was no bleeding. The procedure was tolerated well. Post Debridement Measurements: 2.4cm length x 1.5cm width x 0.1cm depth; 0.283cm^3 volume. Character of Wound/Ulcer Post Debridement requires further debridement. Post procedure Diagnosis Wound #1: Same as Pre-Procedure Plan Follow-up Appointments: Return Appointment in 1 week. - with Dr. Heber [place] The following medication(s) was prescribed: Santyl topical 250 unit/gram ointment 1 1 ointment topical daily to wound starting 04/22/2021 WOUND #1: - Lower Leg Wound Laterality: Left, Anterior Cleanser: Soap and Water 1 x Per Day/ Discharge Instructions: May shower and wash wound with dial antibacterial soap and water prior to dressing change. Peri-Wound Care: Skin Prep 1 x Per Day/ Discharge  Instructions: Use skin prep as directed Prim Dressing: Santyl Ointment 1 x Per Day/ ary Discharge Instructions: Apply nickel thick amount to wound bed as instructed Secondary Dressing: Bordered Gauze, 2x2 in 1 x Per Day/ Discharge Instructions: Apply over primary dressing as directed. 1. Santyl 2. In office sharp debridement 3. Follow-up in 1 week Electronic Signature(s) Signed: 04/26/2021 1:40:23 PM By:  Kalman Shan DO Entered By: Kalman Rice on 04/26/2021 13:39:31 -------------------------------------------------------------------------------- HxROS Details Patient Name: Date of Service: Anita Rice, Anita Brandy [name] 04/22/2021 9:00 A M Medical Record Number: [id number] Patient Account Number: [id number] Date of Birth/Sex: Treating RN: [birth year] (66 y.o. Female) Lorrin Jackson Primary Care Provider: Hiram Comber Other Clinician: Referring Provider: Treating Provider/Extender: Harlene Salts Weeks in Treatment: 0 Information Obtained From Patient Constitutional Symptoms (General Health) Complaints and Symptoms: Negative for: Fatigue; Fever; Chills; Marked Weight Change Eyes Complaints and Symptoms: Negative for: Dry Eyes; Vision Changes; Glasses / Contacts Ear/Nose/Mouth/Throat Complaints and Symptoms: Negative for: Chronic sinus problems or rhinitis Medical History: Positive for: Chronic sinus problems/congestion Respiratory Complaints and Symptoms: Negative for: Chronic or frequent coughs; Shortness of Breath Medical History: Positive for: Asthma Cardiovascular Complaints and Symptoms: Negative for: Chest pain Endocrine Complaints and Symptoms: Negative for: Heat/cold intolerance Genitourinary Complaints and Symptoms: Negative for: Frequent urination Integumentary (Skin) Complaints and Symptoms: Positive for: Wounds - wound left lower leg Musculoskeletal Complaints and Symptoms: Negative for: Muscle Pain; Muscle Weakness Neurologic Complaints and Symptoms: Negative for: Numbness/parasthesias Psychiatric Complaints and Symptoms: Negative for: Claustrophobia; Suicidal Hematologic/Lymphatic Medical History: Positive for: Anemia Past Medical History Notes: Thrombocytopenia 2016 Gastrointestinal Medical History: Positive for: Colitis Immunological Oncologic HBO Extended History Items Ear/Nose/Mouth/Throat: Chronic sinus  problems/congestion Immunizations Pneumococcal Vaccine: Received Pneumococcal Vaccination: No Implantable Devices None Family and Social History Unknown History: Yes; Never smoker; Marital Status - Married; Alcohol Use: Never; Drug Use: No History; Caffeine Use: Rarely; Financial Concerns: No; Food, Clothing or Shelter Needs: No; Support System Lacking: No; Transportation Concerns: No Electronic Signature(s) Signed: 04/22/2021 5:49:30 PM By: Lorrin Jackson Signed: 04/26/2021 1:40:23 PM By: Kalman Shan DO Signed: 04/26/2021 5:43:47 PM By: Levan Hurst RN, BSN Entered By: Levan Hurst on 04/22/2021 10:05:14 -------------------------------------------------------------------------------- SuperBill Details Patient Name: Date of Service: Anita Rice [name] 04/22/2021 Medical Record Number: [id number] Patient Account Number: [id number] Date of Birth/Sex: Treating RN: Nov 30, 1955 (65 y.o. Female) Levan Hurst Primary Care Provider: Hiram Comber Other Clinician: Referring Provider: Treating Provider/Extender: Harlene Salts Weeks in Treatment: 0 Diagnosis Coding ICD-10 Codes Code Description [phone] Unspecified open wound, left lower leg, initial encounter Facility Procedures CPT4 Code: [id number] Description: [place] VISIT-LEV 3 EST PT Modifier: 25 Quantity: 1 CPT4 Code: [id number] Description: 16606 - DEBRIDE W/O ANES NON SELECT Modifier: Quantity: 1 Physician Procedures : CPT4 Code Description Modifier [id number] [place] PHYS LEVEL 3 NEW PT ICD-10 Diagnosis Description S81.802A Unspecified open wound, left lower leg, initial encounter Quantity: 1 Electronic Signature(s) Signed: 04/26/2021 1:40:23 PM By: Kalman Shan DO Entered By: Kalman Rice on 04/26/2021 13:39:52

## 2021-04-26 NOTE — Progress Notes (Signed)
Anita Rice ([id number]) Visit Report for 04/22/2021 Abuse/Suicide Risk Screen Details Patient Name: Date of Service: Anita Rice Anita L. 04/22/2021 9:00 A M Medical Record Number: [id number] Patient Account Number: [id number] Date of Birth/Sex: Treating RN: 1956/07/17 (65 y.o. Female) Levan Hurst Primary Care Jaki Steptoe: Hiram Comber Other Clinician: Referring Len Azeez: Treating Clancey Welton/Extender: Harlene Salts Weeks in Treatment: 0 Abuse/Suicide Risk Screen Items Answer ABUSE RISK SCREEN: Has anyone close to you tried to hurt or harm you recentlyo No Do you feel uncomfortable with anyone in your familyo No Has anyone forced you do things that you didnt want to doo No Electronic Signature(s) Signed: 04/26/2021 5:43:47 PM By: Levan Hurst RN, BSN Entered By: Levan Hurst on 04/22/2021 10:05:23 -------------------------------------------------------------------------------- Activities of Daily Living Details Patient Name: Date of Service: Anita Rice Anita L. 04/22/2021 9:00 [place] Record Number: [id number] Patient Account Number: [id number] Date of Birth/Sex: Treating RN: [birth year] (65 y.o. Female) Levan Hurst Primary Care Kavir Savoca: Hiram Comber Other Clinician: Referring Jamesa Tedrick: Treating Braylen Denunzio/Extender: Harlene Salts Weeks in Treatment: 0 Activities of Daily Living Items Answer Activities of Daily Living (Please select one for each item) Drive Automobile Completely Able T Medications ake Completely Able Use T elephone Completely Able Care for Appearance Completely Able Use T oilet Completely Able Bath / Shower Completely Able Dress Self Completely Able Feed Self Completely Able Walk Completely Able Get In / Out Bed Completely Able Housework Completely Able Prepare Meals Completely [name] for Self Completely Able Electronic Signature(s) Signed: 04/26/2021 5:43:47 PM By:  Levan Hurst RN, BSN Entered By: Levan Hurst on 04/22/2021 10:05:38 -------------------------------------------------------------------------------- Education Screening Details Patient Name: Date of Service: Anita Rice Anita L. 04/22/2021 9:00 A M Medical Record Number: [id number] Patient Account Number: [id number] Date of Birth/Sex: Treating RN: Jul 06, 1956 (65 y.o. Female) Levan Hurst Primary Care Josceline Chenard: Hiram Comber Other Clinician: Referring Perle Brickhouse: Treating Pax Reasoner/Extender: Ilona Sorrel in Treatment: 0 Primary Learner Assessed: Patient Learning Preferences/Education Level/Primary Language Learning Preference: Explanation, Demonstration, Printed Material Highest Education Level: College or Above Preferred Language: English Cognitive Barrier Language Barrier: No Translator Needed: No Memory Deficit: No Emotional Barrier: No Cultural/Religious Beliefs Affecting Medical Care: No Physical Barrier Impaired Vision: No Impaired Hearing: No Decreased Hand dexterity: No Knowledge/Comprehension Knowledge Level: High Comprehension Level: High Ability to understand written instructions: High Ability to understand verbal instructions: High Motivation Anxiety Level: Calm Cooperation: Cooperative Education Importance: Acknowledges Need Interest in Health Problems: Asks Questions Perception: Coherent Willingness to Engage in Self-Management High Activities: Readiness to Engage in Self-Management High Activities: Electronic Signature(s) Signed: 04/26/2021 5:43:47 PM By: Levan Hurst RN, BSN Entered By: Levan Hurst on 04/22/2021 10:05:55 -------------------------------------------------------------------------------- Fall Risk Assessment Details Patient Name: Date of Service: Anita Rice, Anita Rice Anita L. 04/22/2021 9:00 A M Medical Record Number: [id number] Patient Account Number: [id number] Date of Birth/Sex: Treating RN: 10-10-1956 (64 y.o.  Female) Levan Hurst Primary Care Jamaury Gumz: Hiram Comber Other Clinician: Referring Samik Balkcom: Treating Arieal Cuoco/Extender: Harlene Salts Weeks in Treatment: 0 Fall Risk Assessment Items Have you had 2 or more falls in the last 12 monthso 0 No Have you had any fall that resulted in injury in the last 12 monthso 0 No FALLS RISK SCREEN History of falling - immediate or within 3 months 0 No Secondary diagnosis (Do you have 2 or more medical diagnoseso) 0 No Ambulatory aid None/bed rest/wheelchair/nurse 0 Yes Crutches/cane/walker 0 No Furniture 0 No Intravenous therapy Access/Saline/Heparin Lock 0 No Gait/Transferring Normal/  bed rest/ wheelchair 0 Yes Weak (short steps with or without shuffle, stooped but able to lift head while walking, may seek 0 No support from furniture) Impaired (short steps with shuffle, may have difficulty arising from chair, head down, impaired 0 No balance) Mental Status Oriented to own ability 0 Yes Electronic Signature(s) Signed: 04/26/2021 5:43:47 PM By: Levan Hurst RN, BSN Entered By: Levan Hurst on 04/22/2021 10:06:03 -------------------------------------------------------------------------------- Foot Assessment Details Patient Name: Date of Service: Anita Rice Anita L. 04/22/2021 9:00 A M Medical Record Number: [id number] Patient Account Number: [id number] Date of Birth/Sex: Treating RN: October 09, 1956 (65 y.o. Female) Levan Hurst Primary Care Darrah Dredge: Hiram Comber Other Clinician: Referring Mailyn Steichen: Treating Heyli Min/Extender: Harlene Salts Weeks in Treatment: 0 Foot Assessment Items Site Locations + = Sensation present, - = Sensation absent, C = Callus, U = Ulcer R = Redness, W = Warmth, M = Maceration, PU = Pre-ulcerative lesion F = Fissure, S = Swelling, D = Dryness Assessment Right: Left: Other Deformity: No No Prior Foot Ulcer: No No Prior Amputation: No No Charcot  Joint: No No Ambulatory Status: Ambulatory Without Help Gait: Steady Electronic Signature(s) Signed: 04/26/2021 5:43:47 PM By: Levan Hurst RN, BSN Entered By: Levan Hurst on 04/22/2021 10:12:58 -------------------------------------------------------------------------------- Nutrition Risk Screening Details Patient Name: Date of Service: Anita Rice Anita L. 04/22/2021 9:00 A M Medical Record Number: [id number] Patient Account Number: [id number] Date of Birth/Sex: Treating RN: 1955-12-12 (65 y.o. Female) Levan Hurst Primary Care Noga Fogg: Hiram Comber Other Clinician: Referring Giada Schoppe: Treating Janmarie Smoot/Extender: Arletta Bale, Irma Weeks in Treatment: 0 Height (in): 60 Weight (lbs): 118 Body Mass Index (BMI): 23 Nutrition Risk Screening Items Score Screening NUTRITION RISK SCREEN: I have an illness or condition that made me change the kind and/or amount of food I eat 0 No I eat fewer than two meals per day 0 No I eat few fruits and vegetables, or milk products 0 No I have three or more drinks of beer, liquor or wine almost every day 0 No I have tooth or mouth problems that make it hard for me to eat 0 No I don't always have enough money to buy the food I need 0 No I eat alone most of the time 0 No I take three or more different prescribed or over-the-counter drugs a day 1 Yes Without wanting to, I have lost or gained 10 pounds in the last six months 0 No I am not always physically able to shop, cook and/or feed myself 0 No Nutrition Protocols Good Risk Protocol Moderate Risk Protocol 0 Provide education on nutrition High Risk Proctocol Risk Level: Good Risk Score: 1 Electronic Signature(s) Signed: 04/26/2021 5:43:47 PM By: Levan Hurst RN, BSN Entered By: Levan Hurst on 04/22/2021 10:06:17

## 2021-04-29 ENCOUNTER — Encounter (HOSPITAL_BASED_OUTPATIENT_CLINIC_OR_DEPARTMENT_OTHER): Payer: BC Managed Care – PPO | Admitting: Internal Medicine

## 2021-05-06 ENCOUNTER — Encounter (HOSPITAL_BASED_OUTPATIENT_CLINIC_OR_DEPARTMENT_OTHER): Payer: BC Managed Care – PPO | Admitting: Internal Medicine

## 2021-05-06 ENCOUNTER — Other Ambulatory Visit: Payer: Self-pay

## 2021-05-06 DIAGNOSIS — S81802A Unspecified open wound, left lower leg, initial encounter: Secondary | ICD-10-CM

## 2021-05-06 DIAGNOSIS — F988 Other specified behavioral and emotional disorders with onset usually occurring in childhood and adolescence: Secondary | ICD-10-CM | POA: Diagnosis not present

## 2021-05-06 DIAGNOSIS — W228XXA Striking against or struck by other objects, initial encounter: Secondary | ICD-10-CM | POA: Diagnosis not present

## 2021-05-06 NOTE — Progress Notes (Signed)
YUI, MULVANEY ([id number]) Visit Report for 05/06/2021 Chief Complaint Document Details Patient Name: Date of Service: Anita Rice [name] 05/06/2021 9:30 A M Medical Record Number: [id number] Patient Account Number: [id number] Date of Birth/Sex: Treating RN: Nov 24, 1955 (65 y.o. Elam Dutch Primary Care Provider: Hiram Comber Other Clinician: Referring Provider: Treating Provider/Extender: Harlene Salts Weeks in Treatment: 2 Information Obtained from: Patient Chief Complaint Left lower extremity wound Electronic Signature(s) Signed: 05/06/2021 11:41:21 AM By: Kalman Shan DO Entered By: Kalman Shan on 05/06/2021 11:37:03 -------------------------------------------------------------------------------- Debridement Details Patient Name: Date of Service: Anita Rice [name] 05/06/2021 9:30 A M Medical Record Number: [id number] Patient Account Number: [id number] Date of Birth/Sex: Treating RN: 1956/08/02 (65 y.o. Elam Dutch Primary Care Provider: Hiram Comber Other Clinician: Referring Provider: Treating Provider/Extender: Harlene Salts Weeks in Treatment: 2 Debridement Performed for Assessment: Wound #1 Left,Anterior Lower Leg Performed By: Physician Kalman Shan, DO Debridement Type: Debridement Level of Consciousness (Pre-procedure): Awake and Alert Pre-procedure Verification/Time Out Yes - 10:55 Taken: Start Time: 10:57 Pain Control: Lidocaine 5% topical ointment T Area Debrided (L x W): otal 1.1 (cm) x 1 (cm) = 1.1 (cm) Tissue and other material debrided: Viable, Non-Viable, Slough, Subcutaneous, Slough Level: Skin/Subcutaneous Tissue Debridement Description: Excisional Instrument: Curette Bleeding: Minimum Hemostasis Achieved: Pressure End Time: 11:00 Procedural Pain: 2 Post Procedural Pain: 0 Response to Treatment: Procedure was tolerated well Level of Consciousness (Post- Awake and  Alert procedure): Post Debridement Measurements of Total Wound Length: (cm) 1.1 Width: (cm) 1 Depth: (cm) 0.1 Volume: (cm) 0.086 Character of Wound/Ulcer Post Debridement: Improved Post Procedure Diagnosis Same as Pre-procedure Electronic Signature(s) Signed: 05/06/2021 11:41:21 AM By: Kalman Shan DO Signed: 05/06/2021 1:37:28 PM By: Baruch Gouty RN, BSN Entered By: Baruch Gouty on 05/06/2021 10:58:15 -------------------------------------------------------------------------------- HPI Details Patient Name: Date of Service: Anita Rice, Anita Brandy [name] 05/06/2021 9:30 A M Medical Record Number: [id number] Patient Account Number: [id number] Date of Birth/Sex: Treating RN: 1955/12/29 (65 y.o. Elam Dutch Primary Care Provider: Hiram Comber Other Clinician: Referring Provider: Treating Provider/Extender: Harlene Salts Weeks in Treatment: 2 History of Present Illness HPI Description: Admission 7/8 Ms. Tiernan Millikin is a 65 year old female with a past medical history of ADD that presents to the clinic for a 2-1/79-monthhistory of nonhealing left lower extremity wound. She states that [facility] [place] [facility] [employer] [employer] and struck her leg on a metal sign. She has been using soap and water and Neosporin to the area. She now has a blackened scab over the area that has not improved. Over the course of the past 2 months she has been on 2 courses of antibiotics. She is currently not taking any antibiotics at this time. She Currently denies signs of infection. 7/22; patient presents for 2-week follow-up. She has been using Santyl daily. She reports improvement to the wound bed. She has no issues or complaints today. She denies signs of infection. Electronic Signature(s) Signed: 05/06/2021 11:41:21 AM By: HKalman ShanDO Entered By: HKalman Shanon 05/06/2021  11:37:32 -------------------------------------------------------------------------------- Physical Exam Details Patient Name: Date of Service: LDorise BullionNA L. 05/06/2021 9:30 A M Medical Record Number: [id number] Account Number: [id number] of Birth/Sex: Treating RN: [birth year](65y.o. FElam DutchPrimary Care Provider: SHiram ComberOther Clinician: Referring Provider: Treating Provider/Extender: HHarlene SaltsWeeks in Treatment: 2 Constitutional respirations regular, non-labored and within target range for patient.. Cardiovascular 2+ dorsalis pedis/posterior tibialis pulses. Psychiatric pleasant and  cooperative. Notes Left lower extremity: T the anterior aspect there is a wound with granulation tissue and nonviable tissue present. No signs of infection. o Electronic Signature(s) Signed: 05/06/2021 11:41:21 AM By: Kalman Shan DO Entered By: Kalman Shan on 05/06/2021 11:38:03 -------------------------------------------------------------------------------- Physician Orders Details Patient Name: Date of Service: Anita Rice [name] 05/06/2021 9:30 A M Medical Record Number: [id number] Patient Account Number: [id number] Date of Birth/Sex: Treating RN: 05/13/1956 (65 y.o. Elam Dutch Primary Care Provider: Hiram Comber Other Clinician: Referring Provider: Treating Provider/Extender: Harlene Salts Weeks in Treatment: 2 Verbal / Phone Orders: No Diagnosis Coding ICD-10 Coding Code Description [phone] Unspecified open wound, left lower leg, subsequent encounter Follow-up Appointments ppointment in 2 weeks. - with Dr. Heber [place] Return A Bathing/ Shower/ Hygiene May shower and wash wound with soap and water. Wound Treatment Wound #1 - Lower Leg Wound Laterality: Left, Anterior Cleanser: Soap and Water 1 x Per Day/30 Days Discharge Instructions: May shower and wash wound with dial  antibacterial soap and water prior to dressing change. Peri-Wound Care: Skin Prep 1 x Per Day/30 Days Discharge Instructions: Use skin prep as directed Prim Dressing: Santyl Ointment 1 x Per Day/30 Days ary Discharge Instructions: Apply nickel thick amount to wound bed as instructed Secondary Dressing: Bordered Gauze, 2x3.75 in (DME) (Generic) 1 x Per Day/30 Days Discharge Instructions: Apply over primary dressing as directed. Electronic Signature(s) Signed: 05/06/2021 11:41:21 AM By: Kalman Shan DO Entered By: Kalman Shan on 05/06/2021 11:38:51 -------------------------------------------------------------------------------- Problem List Details Patient Name: Date of Service: Anita Rice, Anita Brandy [name] 05/06/2021 9:30 A M Medical Record Number: [id number] Patient Account Number: [id number] Date of Birth/Sex: Treating RN: 05/17/1956 (65 y.o. Elam Dutch Primary Care Provider: Hiram Comber Other Clinician: Referring Provider: Treating Provider/Extender: Harlene Salts Weeks in Treatment: 2 Active Problems ICD-10 Encounter Code Description Active Date MDM Diagnosis S81.802D Unspecified open wound, left lower leg, subsequent encounter 05/06/2021 No Yes Inactive Problems ICD-10 Code Description Active Date Inactive Date S81.802A Unspecified open wound, left lower leg, initial encounter 04/22/2021 04/22/2021 Resolved Problems Electronic Signature(s) Signed: 05/06/2021 11:41:21 AM By: Kalman Shan DO Entered By: Kalman Shan on 05/06/2021 11:36:37 -------------------------------------------------------------------------------- Progress Note Details Patient Name: Date of Service: Anita Rice [name] 05/06/2021 9:30 A M Medical Record Number: [id number] Patient Account Number: [id number] Date of Birth/Sex: Treating RN: 1956-02-03 (65 y.o. Elam Dutch Primary Care Provider: Hiram Comber Other Clinician: Referring Provider: Treating  Provider/Extender: Harlene Salts Weeks in Treatment: 2 Subjective Chief Complaint Information obtained from Patient Left lower extremity wound History of Present Illness (HPI) Admission 7/8 Ms. Tanessa Tidd is a 65 year old female with a past medical history of ADD that presents to the clinic for a 2-1/42-monthhistory of nonhealing left lower extremity wound. She states that she [place] [employer] [place] and struck her leg on a metal sign. She has been using soap and water and Neosporin to the area. She now has a blackened scab over the area that has not improved. Over the course of the past 2 months she has been on 2 courses of antibiotics. She is currently not taking any antibiotics at this time. She Currently denies signs of infection. 7/22; patient presents for 2-week follow-up. She has been using Santyl daily. She reports improvement to the wound bed. She has no issues or complaints today. She denies signs of infection. Patient History Information obtained from Patient. Family History Unknown History. Social History Never smoker, Marital Status -  Married, Alcohol Use - Never, Drug Use - No History, Caffeine Use - Rarely. Medical History Ear/Nose/Mouth/Throat Patient has history of Chronic sinus problems/congestion Hematologic/Lymphatic Patient has history of Anemia Respiratory Patient has history of Asthma Gastrointestinal Patient has history of Colitis Medical A Surgical History Notes nd Hematologic/Lymphatic Thrombocytopenia 2016 Objective Constitutional respirations regular, non-labored and within target range for patient.. Vitals Time Taken: 10:03 AM, Height: 60 in, Weight: 118 lbs, BMI: 23, Temperature: 98.0 F, Pulse: 86 bpm, Respiratory Rate: 18 breaths/min, Blood Pressure: 112/79 mmHg. Cardiovascular 2+ dorsalis pedis/posterior tibialis pulses. Psychiatric pleasant and cooperative. General Notes: Left lower extremity: T  the anterior aspect there is a wound with granulation tissue and nonviable tissue present. No signs of infection. o Integumentary (Hair, Skin) Wound #1 status is Open. Original cause of wound was Trauma. The date acquired was: 02/04/2021. The wound has been in treatment 2 weeks. The wound is located on the Left,Anterior Lower Leg. The wound measures 1.1cm length x 1cm width x 0.1cm depth; 0.864cm^2 area and 0.086cm^3 volume. There is Fat Layer (Subcutaneous Tissue) exposed. There is no tunneling or undermining noted. There is a medium amount of serosanguineous drainage noted. The wound margin is distinct with the outline attached to the wound base. There is large (67-100%) pink granulation within the wound bed. There is a small (1-33%) amount of necrotic tissue within the wound bed including Eschar. Assessment Active Problems ICD-10 Unspecified open wound, left lower leg, subsequent encounter Patient has shown significant improvement in size and appearance of her left lower extremity wound. I debrided nonviable tissue. No signs of infection. I recommended continuing Santyl. Patient will be out of town next week. I recommended Santyl daily for 2 more weeks. I am hopeful that she will be closed at that time. Procedures Wound #1 Pre-procedure diagnosis of Wound #1 is a Trauma, Other located on the Left,Anterior Lower Leg . There was a Excisional Skin/Subcutaneous Tissue Debridement with a total area of 1.1 sq cm performed by Kalman Shan, DO. With the following instrument(s): Curette to remove Viable and Non-Viable tissue/material. Material removed includes Subcutaneous Tissue and Slough and after achieving pain control using Lidocaine 5% topical ointment. No specimens were taken. A time out was conducted at 10:55, prior to the start of the procedure. A Minimum amount of bleeding was controlled with Pressure. The procedure was tolerated well with a pain level of 2 throughout and a pain level of  0 following the procedure. Post Debridement Measurements: 1.1cm length x 1cm width x 0.1cm depth; 0.086cm^3 volume. Character of Wound/Ulcer Post Debridement is improved. Post procedure Diagnosis Wound #1: Same as Pre-Procedure Plan Follow-up Appointments: Return Appointment in 2 weeks. - with Dr. Heber Gary Bathing/ Shower/ Hygiene: May shower and wash wound with soap and water. WOUND #1: - Lower Leg Wound Laterality: Left, Anterior Cleanser: Soap and Water 1 x Per Day/30 Days Discharge Instructions: May shower and wash wound with dial antibacterial soap and water prior to dressing change. Peri-Wound Care: Skin Prep 1 x Per Day/30 Days Discharge Instructions: Use skin prep as directed Prim Dressing: Santyl Ointment 1 x Per Day/30 Days ary Discharge Instructions: Apply nickel thick amount to wound bed as instructed Secondary Dressing: Bordered Gauze, 2x3.75 in (DME) (Generic) 1 x Per Day/30 Days Discharge Instructions: Apply over primary dressing as directed. 1. Santyl daily 2. Follow-up in 2 weeks Electronic Signature(s) Signed: 05/06/2021 11:41:21 AM By: Kalman Shan DO Entered By: Kalman Shan on 05/06/2021 11:40:47 -------------------------------------------------------------------------------- HxROS Details Patient Name: Date of Service: Anita Rice,  Anita [name] 05/06/2021 9:30 A M Medical Record Number: [id number] Patient Account Number: [id number] Date of Birth/Sex: Treating RN: 09/25/1956 (65 y.o. Elam Dutch Primary Care Provider: Hiram Comber Other Clinician: Referring Provider: Treating Provider/Extender: Harlene Salts Weeks in Treatment: 2 Information Obtained From Patient Ear/Nose/Mouth/Throat Medical History: Positive for: Chronic sinus problems/congestion Hematologic/Lymphatic Medical History: Positive for: Anemia Past Medical History Notes: Thrombocytopenia 2016 Respiratory Medical History: Positive for:  Asthma Gastrointestinal Medical History: Positive for: Colitis HBO Extended History Items Ear/Nose/Mouth/Throat: Chronic sinus problems/congestion Immunizations Pneumococcal Vaccine: Received Pneumococcal Vaccination: No Implantable Devices None Family and Social History Unknown History: Yes; Never smoker; Marital Status - Married; Alcohol Use: Never; Drug Use: No History; Caffeine Use: Rarely; Financial Concerns: No; Food, Clothing or Shelter Needs: No; Support System Lacking: No; Transportation Concerns: No Electronic Signature(s) Signed: 05/06/2021 11:41:21 AM By: Kalman Shan DO Signed: 05/06/2021 1:37:28 PM By: Baruch Gouty RN, BSN Entered By: Kalman Shan on 05/06/2021 11:37:38 -------------------------------------------------------------------------------- SuperBill Details Patient Name: Date of Service: Anita Rice [name] 05/06/2021 Medical Record Number: [id number] Patient Account Number: [id number] Date of Birth/Sex: Treating RN: [birth year] (65 y.o. Elam Dutch Primary Care Provider: Hiram Comber Other Clinician: Referring Provider: Treating Provider/Extender: Harlene Salts Weeks in Treatment: 2 Diagnosis Coding ICD-10 Codes Code Description [phone] Unspecified open wound, left lower leg, initial encounter Facility Procedures CPT4 Code: [id number] Description: 93810 - DEB SUBQ TISSUE 20 SQ CM/< ICD-10 Diagnosis Description S81.802A Unspecified open wound, left lower leg, initial encounter Modifier: Quantity: 1 Physician Procedures : CPT4 Code Description Modifier [id number] - WC PHYS SUBQ TISS 20 SQ CM ICD-10 Diagnosis Description S81.802A Unspecified open wound, left lower leg, initial encounter Quantity: 1 Electronic Signature(s) Signed: 05/06/2021 11:41:21 AM By: Kalman Shan DO Entered By: Kalman Shan on 05/06/2021 11:40:51

## 2021-05-06 NOTE — Progress Notes (Signed)
Anita Rice ([id number]) Visit Report for 05/06/2021 Arrival Information Details Patient Name: Date of Service: Anita Rice [name] 05/06/2021 9:30 A M Medical Record Number: [id number] Patient Account Number: [id number] Date of Birth/Sex: Treating RN: 1956/10/05 (65 y.o. Anita Rice Primary Care Dylan Monforte: Hiram Comber Other Clinician: Referring Doren Kaspar: Treating Kayleann Mccaffery/Extender: Harlene Salts Weeks in Treatment: 2 Visit Information History Since Last Visit Added or deleted any medications: No Patient Arrived: Ambulatory Any new allergies or adverse reactions: No Arrival Time: 10:02 Had a fall or experienced change in No Accompanied By: Self activities of daily living that may affect Transfer Assistance: None risk of falls: Patient Identification Verified: Yes Signs or symptoms of abuse/neglect since last visito No Secondary Verification Process Completed: Yes Hospitalized since last visit: No Patient Requires Transmission-Based Precautions: No Implantable device outside of the clinic excluding No Patient Has Alerts: No cellular tissue based products placed in the center since last visit: Has Dressing in Place as Prescribed: Yes Pain Present Now: No Electronic Signature(s) Signed: 05/06/2021 10:49:45 AM By: Sandre Kitty Entered By: Sandre Kitty on 05/06/2021 10:03:22 -------------------------------------------------------------------------------- Encounter Discharge Information Details Patient Name: Date of Service: Anita Rice [name] 05/06/2021 9:30 A M Medical Record Number: [id number] Patient Account Number: [id number] Date of Birth/Sex: Treating RN: [birth year] (65 y.o. Sue Lush Primary Care Brealyn Baril: Hiram Comber Other Clinician: Referring Daiden Coltrane: Treating Trew Sunde/Extender: Harlene Salts Weeks in Treatment: 2 Encounter Discharge Information Items Post Procedure Vitals Discharge  Condition: Stable Temperature (F): 98 Ambulatory Status: Ambulatory Pulse (bpm): 86 Discharge Destination: Home Respiratory Rate (breaths/min): 18 Transportation: Private Auto Blood Pressure (mmHg): 112/79 Schedule Follow-up Appointment: Yes Clinical Summary of Care: Provided on 05/06/2021 Form Type Recipient Paper Patient Patient Electronic Signature(s) Signed: 05/06/2021 1:17:45 PM By: Lorrin Jackson Entered By: Lorrin Jackson on 05/06/2021 11:10:51 -------------------------------------------------------------------------------- Lower Extremity Assessment Details Patient Name: Date of Service: Anita Rice [name] 05/06/2021 9:30 A M Medical Record Number: [id number] Patient Account Number: [id number] Date of Birth/Sex: Treating RN: 1956-03-19 (65 y.o. Sue Lush Primary Care Skarlett Sedlacek: Hiram Comber Other Clinician: Referring Colden Samaras: Treating Nalanie Winiecki/Extender: Harlene Salts Weeks in Treatment: 2 Edema Assessment Assessed: [Left: Yes] [Right: No] Edema: [Left: N] [Right: o] Calf Left: Right: Point of Measurement: 28 cm From Medial Instep 32 cm Ankle Left: Right: Point of Measurement: 9 cm From Medial Instep 19 cm Vascular Assessment Pulses: Dorsalis Pedis Palpable: [Left:Yes] Electronic Signature(s) Signed: 05/06/2021 1:17:45 PM By: Lorrin Jackson Entered By: Lorrin Jackson on 05/06/2021 10:14:48 -------------------------------------------------------------------------------- Multi Wound Chart Details Patient Name: Date of Service: Anita Rice [name] 05/06/2021 9:30 A M Medical Record Number: [id number] Patient Account Number: [id number] Date of Birth/Sex: Treating RN: [birth year] (65 y.o. Anita Rice Primary Care Darrelle Barrell: Hiram Comber Other Clinician: Referring Eero Dini: Treating Daryana Whirley/Extender: Harlene Salts Weeks in Treatment: 2 Vital Signs Height(in): 60 Pulse(bpm): 99 Weight(lbs):  118 Blood Pressure(mmHg): 112/79 Body Mass Index(BMI): 23 Temperature(F): 98.0 Respiratory Rate(breaths/min): 18 Photos: [1:Left, Anterior Lower Leg] [N/A:N/A N/A] Wound Location: [1:Trauma] [N/A:N/A] Wounding Event: [1:Trauma, Other] [N/A:N/A] Primary Etiology: [1:Chronic sinus problems/congestion, N/A] Comorbid History: [1:Anemia, Asthma, Colitis 02/04/2021] [N/A:N/A] Date Acquired: [1:2] [N/A:N/A] Weeks of Treatment: [1:Open] [N/A:N/A] Wound Status: [1:1.1x1x0.1] [N/A:N/A] Measurements L x W x D (cm) [1:0.864] [N/A:N/A] A (cm) : rea [1:0.086] [N/A:N/A] Volume (cm) : [1:69.40%] [N/A:N/A] % Reduction in A [1:rea: 69.60%] [N/A:N/A] % Reduction in Volume: [1:Full Thickness Without Exposed] [N/A:N/A] Classification: [1:Support Structures Medium] [N/A:N/A] Exudate A mount: [1:Serosanguineous] [N/A:N/A] Exudate Type: [  1:red, brown] [N/A:N/A] Exudate Color: [1:Distinct, outline attached] [N/A:N/A] Wound Margin: [1:Large (67-100%)] [N/A:N/A] Granulation A mount: [1:Pink] [N/A:N/A] Granulation Quality: [1:Small (1-33%)] [N/A:N/A] Necrotic A mount: [1:Eschar] [N/A:N/A] Necrotic Tissue: [1:Fat Layer (Subcutaneous Tissue): Yes N/A] Exposed Structures: [1:Fascia: No Tendon: No Muscle: No Joint: No Bone: No None] [N/A:N/A] Epithelialization: [1:Debridement - Excisional] [N/A:N/A] Debridement: Pre-procedure Verification/Time Out 10:55 [N/A:N/A] Taken: [1:Lidocaine 5% topical ointment] [N/A:N/A] Pain Control: [1:Subcutaneous, Slough] [N/A:N/A] Tissue Debrided: [1:Skin/Subcutaneous Tissue] [N/A:N/A] Level: [1:1.1] [N/A:N/A] Debridement A (sq cm): [1:rea Curette] [N/A:N/A] Instrument: [1:Minimum] [N/A:N/A] Bleeding: [1:Pressure] [N/A:N/A] Hemostasis A chieved: [1:2] [N/A:N/A] Procedural Pain: [1:0] [N/A:N/A] Post Procedural Pain: [1:Procedure was tolerated well] [N/A:N/A] Debridement Treatment Response: [1:1.1x1x0.1] [N/A:N/A] Post Debridement Measurements L x W x D (cm) [1:0.086]  [N/A:N/A] Post Debridement Volume: (cm) [1:Debridement] [N/A:N/A] Treatment Notes Wound #1 (Lower Leg) Wound Laterality: Left, Anterior Cleanser Soap and Water Discharge Instruction: May shower and wash wound with dial antibacterial soap and water prior to dressing change. Peri-Wound Care Skin Prep Discharge Instruction: Use skin prep as directed Topical Primary Dressing Santyl Ointment Discharge Instruction: Apply nickel thick amount to wound bed as instructed Secondary Dressing Bordered Gauze, 2x3.75 in Discharge Instruction: Apply over primary dressing as directed. Secured With Compression Wrap Compression Stockings Add-Ons Electronic Signature(s) Signed: 05/06/2021 11:41:21 AM By: Kalman Shan DO Signed: 05/06/2021 1:37:28 PM By: Baruch Gouty RN, BSN Entered By: Kalman Shan on 05/06/2021 11:36:55 -------------------------------------------------------------------------------- Multi-Disciplinary Care Plan Details Patient Name: Date of Service: Anita Rice [name] 05/06/2021 9:30 A M Medical Record Number: [id number] Patient Account Number: [id number] Date of Birth/Sex: Treating RN: 09/10/1956 (65 y.o. Anita Rice Primary Care Jani Moronta: Hiram Comber Other Clinician: Referring Puneet Masoner: Treating Tuvia Woodrick/Extender: Ilona Sorrel in Treatment: 2 Multidisciplinary Care Plan reviewed with physician Active Inactive Wound/Skin Impairment Nursing Diagnoses: Impaired tissue integrity Knowledge deficit related to ulceration/compromised skin integrity Goals: Patient/caregiver will verbalize understanding of skin care regimen Date Initiated: 04/22/2021 Target Resolution Date: 05/20/2021 Goal Status: Active Ulcer/skin breakdown will have a volume reduction of 30% by week 4 Date Initiated: 04/22/2021 Target Resolution Date: 05/20/2021 Goal Status: Active Interventions: Assess patient/caregiver ability to obtain necessary  supplies Assess patient/caregiver ability to perform ulcer/skin care regimen upon admission and as needed Assess ulceration(s) every visit Provide education on ulcer and skin care Notes: Electronic Signature(s) Signed: 05/06/2021 1:37:28 PM By: Baruch Gouty RN, BSN Entered By: Baruch Gouty on 05/06/2021 10:46:20 -------------------------------------------------------------------------------- Pain Assessment Details Patient Name: Date of Service: Anita Rice [name] 05/06/2021 9:30 A M Medical Record Number: [id number] Patient Account Number: [id number] Date of Birth/Sex: Treating RN: Apr 22, 1956 (65 y.o. Anita Rice Primary Care Christyann Manolis: Hiram Comber Other Clinician: Referring Aliyha Fornes: Treating Vonita Calloway/Extender: Harlene Salts Weeks in Treatment: 2 Active Problems Location of Pain Severity and Description of Pain Patient Has Paino No Site Locations Pain Management and Medication Current Pain Management: Electronic Signature(s) Signed: 05/06/2021 10:49:45 AM By: Sandre Kitty Signed: 05/06/2021 1:37:28 PM By: Baruch Gouty RN, BSN Entered By: Sandre Kitty on 05/06/2021 10:04:10 -------------------------------------------------------------------------------- Patient/Caregiver Education Details Patient Name: Date of Service: Anita Rice 7/22/2022andnbsp9:30 A M Medical Record Number: [id number] Patient Account Number: [id number] Date of Birth/Gender: Treating RN: [birth year] (65 y.o. Anita Rice Primary Care Physician: Hiram Comber Other Clinician: Referring Physician: Treating Physician/Extender: Ilona Sorrel in Treatment: 2 Education Assessment Education Provided To: Patient Education Topics Provided Wound/Skin Impairment: Methods: Explain/Verbal Responses: Reinforcements needed, State content correctly Electronic Signature(s) Signed: 05/06/2021 1:37:28 PM By: Baruch Gouty  RN,  BSN Entered By: Baruch Gouty on 05/06/2021 10:46:35 -------------------------------------------------------------------------------- Wound Assessment Details Patient Name: Date of Service: Anita Rice 05/06/2021 9:30 A M Medical Record Number: [id number] Patient Account Number: [id number] Date of Birth/Sex: Treating RN: 25-Aug-1956 (65 y.o. Anita Rice Primary Care Kodah Maret: Hiram Comber Other Clinician: Referring Hendrik Donath: Treating Jeremy Ditullio/Extender: Harlene Salts Weeks in Treatment: 2 Wound Status Wound Number: 1 Primary Etiology: Trauma, Other Wound Location: Left, Anterior Lower Leg Wound Status: Open Wounding Event: Trauma Comorbid Chronic sinus problems/congestion, Anemia, Asthma, History: Colitis Date Acquired: 02/04/2021 Weeks Of Treatment: 2 Clustered Wound: No Photos Wound Measurements Length: (cm) 1.1 Width: (cm) 1 Depth: (cm) 0.1 Area: (cm) 0.864 Volume: (cm) 0.086 % Reduction in Area: 69.4% % Reduction in Volume: 69.6% Epithelialization: None Tunneling: No Undermining: No Wound Description Classification: Full Thickness Without Exposed Support Structures Wound Margin: Distinct, outline attached Exudate Amount: Medium Exudate Type: Serosanguineous Exudate Color: red, brown Foul Odor After Cleansing: No Slough/Fibrino Yes Wound Bed Granulation Amount: Large (67-100%) Exposed Structure Granulation Quality: Pink Fascia Exposed: No Necrotic Amount: Small (1-33%) Fat Layer (Subcutaneous Tissue) Exposed: Yes Necrotic Quality: Eschar Tendon Exposed: No Muscle Exposed: No Joint Exposed: No Bone Exposed: No Treatment Notes Wound #1 (Lower Leg) Wound Laterality: Left, Anterior Cleanser Soap and Water Discharge Instruction: May shower and wash wound with dial antibacterial soap and water prior to dressing change. Peri-Wound Care Skin Prep Discharge Instruction: Use skin prep as directed Topical Primary  Dressing Santyl Ointment Discharge Instruction: Apply nickel thick amount to wound bed as instructed Secondary Dressing Bordered Gauze, 2x3.75 in Discharge Instruction: Apply over primary dressing as directed. Secured With Compression Wrap Compression Stockings Environmental education officer) Signed: 05/06/2021 1:17:45 PM By: Lorrin Jackson Signed: 05/06/2021 1:37:28 PM By: Baruch Gouty RN, BSN Entered By: Lorrin Jackson on 05/06/2021 10:15:15 -------------------------------------------------------------------------------- Vitals Details Patient Name: Date of Service: Anita Rice [name] 05/06/2021 9:30 A M Medical Record Number: [id number] Patient Account Number: [id number] Date of Birth/Sex: Treating RN: 03/06/1956 (65 y.o. Anita Rice Primary Care Koa Palla: Hiram Comber Other Clinician: Referring Josemaria Brining: Treating Karis Rilling/Extender: Harlene Salts Weeks in Treatment: 2 Vital Signs Time Taken: 10:03 Temperature (F): 98.0 Height (in): 60 Pulse (bpm): 86 Weight (lbs): 118 Respiratory Rate (breaths/min): 18 Body Mass Index (BMI): 23 Blood Pressure (mmHg): 112/79 Reference Range: 80 - 120 mg / dl Electronic Signature(s) Signed: 05/06/2021 10:49:45 AM By: Sandre Kitty Entered By: Sandre Kitty on 05/06/2021 10:04:00

## 2021-05-09 DIAGNOSIS — S81802D Unspecified open wound, left lower leg, subsequent encounter: Secondary | ICD-10-CM | POA: Diagnosis not present

## 2021-05-20 ENCOUNTER — Other Ambulatory Visit: Payer: Self-pay

## 2021-05-20 ENCOUNTER — Encounter (HOSPITAL_BASED_OUTPATIENT_CLINIC_OR_DEPARTMENT_OTHER): Payer: BC Managed Care – PPO | Attending: Internal Medicine | Admitting: Internal Medicine

## 2021-05-20 DIAGNOSIS — S81802D Unspecified open wound, left lower leg, subsequent encounter: Secondary | ICD-10-CM

## 2021-05-20 DIAGNOSIS — L98499 Non-pressure chronic ulcer of skin of other sites with unspecified severity: Secondary | ICD-10-CM | POA: Insufficient documentation

## 2021-05-20 NOTE — Progress Notes (Signed)
Anita Rice, Anita Rice ([id number]) Visit Report for 05/20/2021 Arrival Information Details Patient Name: Date of Service: Anita Rice [place] L. 05/20/2021 9:30 A M Medical Record Number: [id number] Patient Account Number: [id number] Date of Birth/Sex: Treating RN: 1956/07/31 (65 y.o. Sue Lush Primary Care Brittanni Cariker: Hiram Comber Other Clinician: Referring Arye Weyenberg: Treating Lin Hackmann/Extender: Harlene Salts Weeks in Treatment: 4 Visit Information History Since Last Visit Added or deleted any medications: No Patient Arrived: Ambulatory Any new allergies or adverse reactions: No Arrival Time: 09:47 Had a fall or experienced change in No Accompanied By: self activities of daily living that may affect Transfer Assistance: None risk of falls: Patient Identification Verified: Yes Signs or symptoms of abuse/neglect since last visito No Secondary Verification Process Completed: Yes Hospitalized since last visit: No Patient Requires Transmission-Based Precautions: No Implantable device outside of the clinic excluding No Patient Has Alerts: No cellular tissue based products placed in the center since last visit: Has Dressing in Place as Prescribed: Yes Pain Present Now: No Electronic Signature(s) Signed: 05/20/2021 1:36:18 PM By: Lorrin Jackson Entered By: Lorrin Jackson on 05/20/2021 09:47:28 -------------------------------------------------------------------------------- Encounter Discharge Information Details Patient Name: Date of Service: Anita Rice, Anita Brandy Anita L. 05/20/2021 9:30 A M Medical Record Number: [id number] Patient Account Number: [id number] Date of Birth/Sex: Treating RN: September 25, 1956 (65 y.o. Sue Lush Primary Care Davy Westmoreland: Hiram Comber Other Clinician: Referring Solly Derasmo: Treating Dejan Angert/Extender: Harlene Salts Weeks in Treatment: 4 Encounter Discharge Information Items Post Procedure Vitals Discharge Condition:  Stable Temperature (F): 98.5 Ambulatory Status: Ambulatory Pulse (bpm): 105 Discharge Destination: Home Respiratory Rate (breaths/min): 18 Transportation: Private Auto Blood Pressure (mmHg): 125/77 Schedule Follow-up Appointment: Yes Clinical Summary of Care: Provided on 05/20/2021 Form Type Recipient Paper Patient Patient Electronic Signature(s) Signed: 05/20/2021 1:36:18 PM By: Lorrin Jackson Entered By: Lorrin Jackson on 05/20/2021 10:35:23 -------------------------------------------------------------------------------- Lower Extremity Assessment Details Patient Name: Date of Service: Anita Rice Anita L. 05/20/2021 9:30 A M Medical Record Number: [id number] Patient Account Number: [id number] Date of Birth/Sex: Treating RN: 27-Jun-1956 (65 y.o. Sue Lush Primary Care Alexys Lobello: Hiram Comber Other Clinician: Referring Linkyn Gobin: Treating Stehanie Ekstrom/Extender: Harlene Salts Weeks in Treatment: 4 Edema Assessment Assessed: [Left: Yes] [Right: No] Edema: [Left: N] [Right: o] Calf Left: Right: Point of Measurement: 28 cm From Medial Instep 32.2 cm Ankle Left: Right: Point of Measurement: 9 cm From Medial Instep 19 cm Vascular Assessment Pulses: Dorsalis Pedis Palpable: [Left:Yes] Electronic Signature(s) Signed: 05/20/2021 1:36:18 PM By: Lorrin Jackson Entered By: Lorrin Jackson on 05/20/2021 10:21:09 -------------------------------------------------------------------------------- Multi Wound Chart Details Patient Name: Date of Service: Anita Rice Anita L. 05/20/2021 9:30 A M Medical Record Number: [id number] Patient Account Number: [id number] Date of Birth/Sex: Treating RN: 07-Jul-1956 (65 y.o. Sue Lush Primary Care Remmy Crass: Hiram Comber Other Clinician: Referring Libia Fazzini: Treating Shawntelle Ungar/Extender: Harlene Salts Weeks in Treatment: 4 Vital Signs Height(in): 60 Pulse(bpm): 105 Weight(lbs): 118 Blood  Pressure(mmHg): 125/77 Body Mass Index(BMI): 23 Temperature(F): 98.5 Respiratory Rate(breaths/min): 18 Photos: [1:Left, Anterior Lower Leg] [N/A:N/A N/A] Wound Location: [1:Trauma] [N/A:N/A] Wounding Event: [1:Trauma, Other] [N/A:N/A] Primary Etiology: [1:Chronic sinus problems/congestion, N/A] Comorbid History: [1:Anemia, Asthma, Colitis 02/04/2021] [N/A:N/A] Date Acquired: [1:4] [N/A:N/A] Weeks of Treatment: [1:Open] [N/A:N/A] Wound Status: [1:1.1x1x0.1] [N/A:N/A] Measurements L x W x D (cm) [1:0.864] [N/A:N/A] A (cm) : rea [1:0.086] [N/A:N/A] Volume (cm) : [1:69.40%] [N/A:N/A] % Reduction in A [1:rea: 69.60%] [N/A:N/A] % Reduction in Volume: [1:Full Thickness Without Exposed] [N/A:N/A] Classification: [1:Support Structures Medium] [N/A:N/A] Exudate A mount: [1:Serosanguineous] [N/A:N/A] Exudate Type: [  1:red, brown] [N/A:N/A] Exudate Color: [1:Distinct, outline attached] [N/A:N/A] Wound Margin: [1:Large (67-100%)] [N/A:N/A] Granulation A mount: [1:Pink] [N/A:N/A] Granulation Quality: [1:Small (1-33%)] [N/A:N/A] Necrotic A mount: [1:Fat Layer (Subcutaneous Tissue): Yes N/A] Exposed Structures: [1:Fascia: No Tendon: No Muscle: No Joint: No Bone: No Medium (34-66%)] [N/A:N/A] Epithelialization: [1:Debridement - Excisional] [N/A:N/A] Debridement: Pre-procedure Verification/Time Out 10:24 [N/A:N/A] Taken: [1:Lidocaine 4% Topical Solution] [N/A:N/A] Pain Control: [1:Subcutaneous, Slough] [N/A:N/A] Tissue Debrided: [1:Skin/Subcutaneous Tissue] [N/A:N/A] Level: [1:0.25] [N/A:N/A] Debridement A (sq cm): [1:rea Curette] [N/A:N/A] Instrument: [1:Minimum] [N/A:N/A] Bleeding: [1:Pressure] [N/A:N/A] Hemostasis A chieved: [1:Procedure was tolerated well] [N/A:N/A] Debridement Treatment Response: [1:1.1x1x0.1] [N/A:N/A] Post Debridement Measurements L x W x D (cm) [1:0.086] [N/A:N/A] Post Debridement Volume: (cm) [1:Debridement] [N/A:N/A] Treatment Notes Wound #1 (Lower Leg) Wound  Laterality: Left, Anterior Cleanser Soap and Water Discharge Instruction: May shower and wash wound with dial antibacterial soap and water prior to dressing change. Peri-Wound Care Skin Prep Discharge Instruction: Use skin prep as directed Topical Primary Dressing Hydrofera Blue Ready Foam, 2.5 x2.5 in Discharge Instruction: Apply to wound bed as instructed Santyl Ointment Discharge Instruction: Apply nickel thick amount to wound bed as instructed Secondary Dressing Bordered Gauze, 2x3.75 in Discharge Instruction: Apply over primary dressing as directed. Secured With Compression Wrap Compression Stockings Environmental education officer) Signed: 05/20/2021 10:49:40 AM By: Kalman Shan DO Signed: 05/20/2021 1:36:18 PM By: Fara Chute By: Kalman Shan on 05/20/2021 10:46:37 -------------------------------------------------------------------------------- Multi-Disciplinary Care Plan Details Patient Name: Date of Service: Anita Rice, Anita Brandy Anita L. 05/20/2021 9:30 A M Medical Record Number: [id number] Patient Account Number: [id number] Date of Birth/Sex: Treating RN: 09-05-1956 (65 y.o. Sue Lush Primary Care Rebeca Valdivia: Hiram Comber Other Clinician: Referring Pleasant Britz: Treating Caroly Purewal/Extender: Ilona Sorrel in Treatment: [place] reviewed with physician Active Inactive Wound/Skin Impairment Nursing Diagnoses: Impaired tissue integrity Knowledge deficit related to ulceration/compromised skin integrity Goals: Patient/caregiver will verbalize understanding of skin care regimen Date Initiated: 04/22/2021 Date Inactivated: 05/20/2021 Target Resolution Date: 05/20/2021 Goal Status: Met Ulcer/skin breakdown will have a volume reduction of 30% by week 4 Date Initiated: 04/22/2021 Date Inactivated: 05/20/2021 Target Resolution Date: 05/20/2021 Goal Status: Met Ulcer/skin breakdown will have a volume reduction of 50% by  week 8 Date Initiated: 05/20/2021 Target Resolution Date: 06/17/2021 Goal Status: Active Interventions: Assess patient/caregiver ability to obtain necessary supplies Assess patient/caregiver ability to perform ulcer/skin care regimen upon admission and as needed Assess ulceration(s) every visit Provide education on ulcer and skin care Notes: Electronic Signature(s) Signed: 05/20/2021 1:36:18 PM By: Lorrin Jackson Entered By: Lorrin Jackson on 05/20/2021 10:21:50 -------------------------------------------------------------------------------- Pain Assessment Details Patient Name: Date of Service: Anita Rice Anita L. 05/20/2021 9:30 A M Medical Record Number: [id number] Patient Account Number: [id number] Date of Birth/Sex: Treating RN: 12-26-1955 (65 y.o. Sue Lush Primary Care Florene Brill: Hiram Comber Other Clinician: Referring Saintclair Schroader: Treating Jerek Meulemans/Extender: Harlene Salts Weeks in Treatment: 4 Active Problems Location of Pain Severity and Description of Pain Patient Has Paino No Patient Has Paino No Site Locations Pain Management and Medication Current Pain Management: Electronic Signature(s) Signed: 05/20/2021 1:36:18 PM By: Lorrin Jackson Entered By: Lorrin Jackson on 05/20/2021 09:47:51 -------------------------------------------------------------------------------- Patient/Caregiver Education Details Patient Name: Date of Service: Anita Rice 8/5/2022andnbsp9:30 A M Medical Record Number: [id number] Patient Account Number: [id number] Date of Birth/Gender: Treating RN: [birth year] (65 y.o. Sue Lush Primary Care Physician: Hiram Comber Other Clinician: Referring Physician: Treating Physician/Extender: Ilona Sorrel in Treatment: 4 Education Assessment Education Provided To: Patient Education Topics  Provided Wound/Skin Impairment: Methods: Explain/Verbal, Printed Responses: State  content correctly Electronic Signature(s) Signed: 05/20/2021 1:36:18 PM By: Lorrin Jackson Entered By: Lorrin Jackson on 05/20/2021 10:22:05 -------------------------------------------------------------------------------- Wound Assessment Details Patient Name: Date of Service: Anita Rice Anita L. 05/20/2021 9:30 A M Medical Record Number: [id number] Patient Account Number: [id number] Date of Birth/Sex: Treating RN: 20-May-1956 (65 y.o. Sue Lush Primary Care Apolonio Cutting: Hiram Comber Other Clinician: Referring Standley Bargo: Treating Marijke Guadiana/Extender: Harlene Salts Weeks in Treatment: 4 Wound Status Wound Number: 1 Primary Etiology: Trauma, Other Wound Location: Left, Anterior Lower Leg Wound Status: Open Wounding Event: Trauma Comorbid Chronic sinus problems/congestion, Anemia, Asthma, History: Colitis Date Acquired: 02/04/2021 Weeks Of Treatment: 4 Clustered Wound: No Photos Wound Measurements Length: (cm) 1.1 Width: (cm) 1 Depth: (cm) 0.1 Area: (cm) 0.864 Volume: (cm) 0.086 % Reduction in Area: 69.4% % Reduction in Volume: 69.6% Epithelialization: Medium (34-66%) Tunneling: No Undermining: No Wound Description Classification: Full Thickness Without Exposed Support Structures Wound Margin: Distinct, outline attached Exudate Amount: Medium Exudate Type: Serosanguineous Exudate Color: red, brown Foul Odor After Cleansing: No Slough/Fibrino Yes Wound Bed Granulation Amount: Large (67-100%) Exposed Structure Granulation Quality: Pink Fascia Exposed: No Necrotic Amount: Small (1-33%) Fat Layer (Subcutaneous Tissue) Exposed: Yes Necrotic Quality: Adherent Slough Tendon Exposed: No Muscle Exposed: No Joint Exposed: No Bone Exposed: No Treatment Notes Wound #1 (Lower Leg) Wound Laterality: Left, Anterior Cleanser Soap and Water Discharge Instruction: May shower and wash wound with dial antibacterial soap and water prior to dressing  change. Peri-Wound Care Skin Prep Discharge Instruction: Use skin prep as directed Topical Primary Dressing Hydrofera Blue Ready Foam, 2.5 x2.5 in Discharge Instruction: Apply to wound bed as instructed Santyl Ointment Discharge Instruction: Apply nickel thick amount to wound bed as instructed Secondary Dressing Bordered Gauze, 2x3.75 in Discharge Instruction: Apply over primary dressing as directed. Secured With Compression Wrap Compression Stockings Environmental education officer) Signed: 05/20/2021 1:36:18 PM By: Lorrin Jackson Entered By: Lorrin Jackson on 05/20/2021 10:20:07 -------------------------------------------------------------------------------- Vitals Details Patient Name: Date of Service: Anita Rice, Anita Brandy Anita L. 05/20/2021 9:30 A M Medical Record Number: [id number] Patient Account Number: [id number] Date of Birth/Sex: Treating RN: [birth year] (65 y.o. Sue Lush Primary Care Kiyomi Pallo: Hiram Comber Other Clinician: Referring Elwyn Klosinski: Treating Patryk Conant/Extender: Harlene Salts Weeks in Treatment: 4 Vital Signs Time Taken: 09:47 Temperature (F): 98.5 Height (in): 60 Pulse (bpm): 105 Weight (lbs): 118 Respiratory Rate (breaths/min): 18 Body Mass Index (BMI): 23 Blood Pressure (mmHg): 125/77 Reference Range: 80 - 120 mg / dl Electronic Signature(s) Signed: 05/20/2021 1:36:18 PM By: Lorrin Jackson Entered By: Lorrin Jackson on 05/20/2021 09:47:45

## 2021-05-20 NOTE — Progress Notes (Signed)
Anita Rice ([id number]) Visit Report for 05/20/2021 Chief Complaint Document Details Patient Name: Date of Service: Anita Rice [name] 05/20/2021 9:30 A M Medical Record Number: [id number] Patient Account Number: [id number] Date of Birth/Sex: Treating RN: 29-Jun-1956 (65 y.o. Sue Lush Primary Care Provider: Hiram Comber Other Clinician: Referring Provider: Treating Provider/Extender: Harlene Salts Weeks in Treatment: 4 Information Obtained from: Patient Chief Complaint Left lower extremity wound Electronic Signature(s) Signed: 05/20/2021 10:49:40 AM By: Kalman Shan DO Entered By: Kalman Shan on 05/20/2021 10:46:47 -------------------------------------------------------------------------------- Debridement Details Patient Name: Date of Service: Anita Rice [name] 05/20/2021 9:30 A M Medical Record Number: [id number] Patient Account Number: [id number] Date of Birth/Sex: Treating RN: [birth year] (65 y.o. Sue Lush Primary Care Provider: Hiram Comber Other Clinician: Referring Provider: Treating Provider/Extender: Harlene Salts Weeks in Treatment: 4 Debridement Performed for Assessment: Wound #1 Left,Anterior Lower Leg Performed By: Physician Kalman Shan, DO Debridement Type: Debridement Level of Consciousness (Pre-procedure): Awake and Alert Pre-procedure Verification/Time Out Yes - 10:24 Taken: Start Time: 10:25 Pain Control: Lidocaine 4% T opical Solution T Area Debrided (L x W): otal 0.5 (cm) x 0.5 (cm) = 0.25 (cm) Tissue and other material debrided: Non-Viable, Slough, Subcutaneous, Slough Level: Skin/Subcutaneous Tissue Debridement Description: Excisional Instrument: Curette Bleeding: Minimum Hemostasis Achieved: Pressure End Time: 10:30 Response to Treatment: Procedure was tolerated well Level of Consciousness (Post- Awake and Alert procedure): Post Debridement Measurements of Total  Wound Length: (cm) 1.1 Width: (cm) 1 Depth: (cm) 0.1 Volume: (cm) 0.086 Character of Wound/Ulcer Post Debridement: Stable Post Procedure Diagnosis Same as Pre-procedure Electronic Signature(s) Signed: 05/20/2021 10:49:40 AM By: Kalman Shan DO Signed: 05/20/2021 1:36:18 PM By: Lorrin Jackson Entered By: Lorrin Jackson on 05/20/2021 10:30:28 -------------------------------------------------------------------------------- HPI Details Patient Name: Date of Service: Anita Rice [name] 05/20/2021 9:30 A M Medical Record Number: [id number] Patient Account Number: [id number] Date of Birth/Sex: Treating RN: [birth year] (65 y.o. Sue Lush Primary Care Provider: Hiram Comber Other Clinician: Referring Provider: Treating Provider/Extender: Harlene Salts Weeks in Treatment: 4 History of Present Illness HPI Description: Admission 7/8 Ms. Anita Rice is a 65 year old female with a past medical history of ADD that presents to the clinic for a 2-1/24-monthhistory of nonhealing left lower extremity wound. She states that she [facility] [employer] [place] and struck her leg on a metal sign. She has been using soap and water and Neosporin to the area. She now has a blackened scab over the area that has not improved. Over the course of the past 2 months she has been on 2 courses of antibiotics. She is currently not taking any antibiotics at this time. She Currently denies signs of infection. 7/22; patient presents for 2-week follow-up. She has been using Santyl daily. She reports improvement to the wound bed. She has no issues or complaints today. She denies signs of infection. 8/5; patient presents for 2-week follow-up. She has been using Santyl daily. She has no issues or complaints today. She denies signs of infection. Electronic Signature(s) Signed: 05/20/2021 10:49:40 AM By: HKalman ShanDO Entered By: HKalman Shanon 05/20/2021  10:47:20 -------------------------------------------------------------------------------- Physical Exam Details Patient Name: Date of Service: LDorise BullionNA L. 05/20/2021 9:30 A M Medical Record Number: [id number] Account Number: [id number] of Birth/Sex: Treating RN: [birth year](65y.o. FSue LushPrimary Care Provider: SHiram ComberOther Clinician: Referring Provider: Treating Provider/Extender: HHarlene SaltsWeeks in Treatment: 4 Constitutional respirations regular, non-labored  and within target range for patient.. Cardiovascular 2+ dorsalis pedis/posterior tibialis pulses. Psychiatric pleasant and cooperative. Notes Left lower extremity: Open wound with granulation tissue and scant nonviable tissue present. No signs of infection. Electronic Signature(s) Signed: 05/20/2021 10:49:40 AM By: Kalman Shan DO Entered By: Kalman Shan on 05/20/2021 10:47:56 -------------------------------------------------------------------------------- Physician Orders Details Patient Name: Date of Service: Anita Rice [name] 05/20/2021 9:30 A M Medical Record Number: [id number] Patient Account Number: [id number] Date of Birth/Sex: Treating RN: [birth year] (65 y.o. Sue Lush Primary Care Provider: Hiram Comber Other Clinician: Referring Provider: Treating Provider/Extender: Harlene Salts Weeks in Treatment: 4 Verbal / Phone Orders: No Diagnosis Coding ICD-10 Coding Code Description [phone] Unspecified open wound, left lower leg, subsequent encounter Follow-up Appointments ppointment in 2 weeks. - with Dr. Heber [place] Return A Bathing/ Shower/ Hygiene May shower and wash wound with soap and water. Additional Orders / Instructions Follow Nutritious Diet Wound Treatment Wound #1 - Lower Leg Wound Laterality: Left, Anterior Cleanser: Soap and Water Every Other Day/30 Days Discharge Instructions: May shower and  wash wound with dial antibacterial soap and water prior to dressing change. Peri-Wound Care: Skin Prep Every Other Day/30 Days Discharge Instructions: Use skin prep as directed Prim Dressing: Hydrofera Blue Ready Foam, 2.5 x2.5 in (DME) (Generic) Every Other Day/30 Days ary Discharge Instructions: Apply to wound bed as instructed Prim Dressing: Santyl Ointment Every Other Day/30 Days ary Discharge Instructions: Apply nickel thick amount to wound bed as instructed Secondary Dressing: Bordered Gauze, 2x3.75 in (DME) (Generic) Every Other Day/30 Days Discharge Instructions: Apply over primary dressing as directed. Electronic Signature(s) Signed: 05/20/2021 10:49:40 AM By: Kalman Shan DO Entered By: Kalman Shan on 05/20/2021 10:48:06 -------------------------------------------------------------------------------- Problem List Details Patient Name: Date of Service: Anita Rice, [name] [name] 05/20/2021 9:30 A M Medical Record Number: [id number] Patient Account Number: [id number] Date of Birth/Sex: Treating RN: 1955/11/05 (65 y.o. Sue Lush Primary Care Provider: Hiram Comber Other Clinician: Referring Provider: Treating Provider/Extender: Harlene Salts Weeks in Treatment: 4 Active Problems ICD-10 Encounter Code Description Active Date MDM Diagnosis S81.802D Unspecified open wound, left lower leg, subsequent encounter 05/06/2021 No Yes Inactive Problems ICD-10 Code Description Active Date Inactive Date S81.802A Unspecified open wound, left lower leg, initial encounter 04/22/2021 04/22/2021 Resolved Problems Electronic Signature(s) Signed: 05/20/2021 10:49:40 AM By: Kalman Shan DO Entered By: Kalman Shan on 05/20/2021 10:46:30 -------------------------------------------------------------------------------- Progress Note Details Patient Name: Date of Service: Anita Rice [name] 05/20/2021 9:30 A M Medical Record Number: [id number] Patient Account  Number: [id number] Date of Birth/Sex: Treating RN: [birth year] (65 y.o. Sue Lush Primary Care Provider: Hiram Comber Other Clinician: Referring Provider: Treating Provider/Extender: Harlene Salts Weeks in Treatment: 4 Subjective Chief Complaint Information obtained from Patient Left lower extremity wound History of Present Illness (HPI) Admission 7/8 Ms. Kenlea Woodell is a 65 year old female with a past medical history of ADD that presents to the clinic for a 2-1/3-monthhistory of nonhealing left lower extremity wound. She states that she was [facility] [place] [employer] [place] and struck her leg on a metal sign. She has been using soap and water and Neosporin to the area. She now has a blackened scab over the area that has not improved. Over the course of the past 2 months she has been on 2 courses of antibiotics. She is currently not taking any antibiotics at this time. She Currently denies signs of infection. 7/22; patient presents for 2-week follow-up. She has been using Santyl  daily. She reports improvement to the wound bed. She has no issues or complaints today. She denies signs of infection. 8/5; patient presents for 2-week follow-up. She has been using Santyl daily. She has no issues or complaints today. She denies signs of infection. Patient History Information obtained from Patient. Family History Unknown History. Social History Never smoker, Marital Status - Married, Alcohol Use - Never, Drug Use - No History, Caffeine Use - Rarely. Medical History Ear/Nose/Mouth/Throat Patient has history of Chronic sinus problems/congestion Hematologic/Lymphatic Patient has history of Anemia Respiratory Patient has history of Asthma Gastrointestinal Patient has history of Colitis Medical A Surgical History Notes nd Hematologic/Lymphatic Thrombocytopenia 2016 Objective Constitutional respirations regular, non-labored and within target range  for patient.. Vitals Time Taken: 9:47 AM, Height: 60 in, Weight: 118 lbs, BMI: 23, Temperature: 98.5 F, Pulse: 105 bpm, Respiratory Rate: 18 breaths/min, Blood Pressure: 125/77 mmHg. Cardiovascular 2+ dorsalis pedis/posterior tibialis pulses. Psychiatric pleasant and cooperative. General Notes: Left lower extremity: Open wound with granulation tissue and scant nonviable tissue present. No signs of infection. Integumentary (Hair, Skin) Wound #1 status is Open. Original cause of wound was Trauma. The date acquired was: 02/04/2021. The wound has been in treatment 4 weeks. The wound is located on the Left,Anterior Lower Leg. The wound measures 1.1cm length x 1cm width x 0.1cm depth; 0.864cm^2 area and 0.086cm^3 volume. There is Fat Layer (Subcutaneous Tissue) exposed. There is no tunneling or undermining noted. There is a medium amount of serosanguineous drainage noted. The wound margin is distinct with the outline attached to the wound base. There is large (67-100%) pink granulation within the wound bed. There is a small (1-33%) amount of necrotic tissue within the wound bed including Adherent Slough. Assessment Active Problems ICD-10 Unspecified open wound, left lower leg, subsequent encounter Patient's wound is stable. I was hopeful she would be closed by now. I debrided nonviable tissue. I recommended continuing Santyl but now would like to add [facility] with daily dressing changes. No signs of infection on exam. Follow-up in 2 weeks Procedures Wound #1 Pre-procedure diagnosis of Wound #1 is a Trauma, Other located on the Left,Anterior Lower Leg . There was a Excisional Skin/Subcutaneous Tissue Debridement with a total area of 0.25 sq cm performed by Kalman Shan, DO. With the following instrument(s): Curette to remove Non-Viable tissue/material. Material removed includes Subcutaneous Tissue and Slough and after achieving pain control using Lidocaine 4% T opical Solution. No  specimens were taken. A time out was conducted at 10:24, prior to the start of the procedure. A Minimum amount of bleeding was controlled with Pressure. The procedure was tolerated well. Post Debridement Measurements: 1.1cm length x 1cm width x 0.1cm depth; 0.086cm^3 volume. Character of Wound/Ulcer Post Debridement is stable. Post procedure Diagnosis Wound #1: Same as Pre-Procedure Plan Follow-up Appointments: Return Appointment in 2 weeks. - with Dr. Heber [place] Bathing/ Shower/ Hygiene: May shower and wash wound with soap and water. Additional Orders / Instructions: Follow Nutritious Diet WOUND #1: - Lower Leg Wound Laterality: Left, Anterior Cleanser: Soap and Water Every Other Day/30 Days Discharge Instructions: May shower and wash wound with dial antibacterial soap and water prior to dressing change. Peri-Wound Care: Skin Prep Every Other Day/30 Days Discharge Instructions: Use skin prep as directed Prim Dressing: Hydrofera Blue Ready Foam, 2.5 x2.5 in (DME) (Generic) Every Other Day/30 Days ary Discharge Instructions: Apply to wound bed as instructed Prim Dressing: Santyl Ointment Every Other Day/30 Days ary Discharge Instructions: Apply nickel thick amount to wound bed as instructed  Secondary Dressing: Bordered Gauze, 2x3.75 in (DME) (Generic) Every Other Day/30 Days Discharge Instructions: Apply over primary dressing as directed. 1. In office sharp debridement 2. Santyl and Hydrofera Blue 3. Follow-up in 2 weeks Electronic Signature(s) Signed: 05/20/2021 10:49:40 AM By: Kalman Shan DO Entered By: Kalman Shan on 05/20/2021 10:48:58 -------------------------------------------------------------------------------- HxROS Details Patient Name: Date of Service: Anita Rice, [name] [name] 05/20/2021 9:30 A M Medical Record Number: [id number] Patient Account Number: [id number] Date of Birth/Sex: Treating RN: January 20, 1956 (65 y.o. Sue Lush Primary Care Provider: Hiram Comber Other Clinician: Referring Provider: Treating Provider/Extender: Harlene Salts Weeks in Treatment: 4 Information Obtained From Patient Ear/Nose/Mouth/Throat Medical History: Positive for: Chronic sinus problems/congestion Hematologic/Lymphatic Medical History: Positive for: Anemia Past Medical History Notes: Thrombocytopenia 2016 Respiratory Medical History: Positive for: Asthma Gastrointestinal Medical History: Positive for: Colitis HBO Extended History Items Ear/Nose/Mouth/Throat: Chronic sinus problems/congestion Immunizations Pneumococcal Vaccine: Received Pneumococcal Vaccination: No Implantable Devices None Family and Social History Unknown History: Yes; Never smoker; Marital Status - Married; Alcohol Use: Never; Drug Use: No History; Caffeine Use: Rarely; Financial Concerns: No; Food, Clothing or Shelter Needs: No; Support System Lacking: No; Transportation Concerns: No Electronic Signature(s) Signed: 05/20/2021 10:49:40 AM By: Kalman Shan DO Signed: 05/20/2021 1:36:18 PM By: Lorrin Jackson Entered By: Kalman Shan on 05/20/2021 10:47:26 -------------------------------------------------------------------------------- SuperBill Details Patient Name: Date of Service: Anita Rice [name] 05/20/2021 Medical Record Number: [id number] Patient Account Number: [id number] Date of Birth/Sex: Treating RN: 1956/10/14 (65 y.o. Sue Lush Primary Care Provider: Hiram Comber Other Clinician: Referring Provider: Treating Provider/Extender: Harlene Salts Weeks in Treatment: 4 Diagnosis Coding ICD-10 Codes Code Description [phone] Unspecified open wound, left lower leg, subsequent encounter Facility Procedures CPT4 Code: [id number] Description: 18403 - DEB SUBQ TISSUE 20 SQ CM/< ICD-10 Diagnosis Description S81.802D Unspecified open wound, left lower leg, subsequent encounter Modifier: Quantity: 1 Physician  Procedures : CPT4 Code Description Modifier [id number] 11042 - WC PHYS SUBQ TISS 20 SQ CM ICD-10 Diagnosis Description S81.802D Unspecified open wound, left lower leg, subsequent encounter Quantity: 1 Electronic Signature(s) Signed: 05/20/2021 10:49:40 AM By: Kalman Shan DO Entered By: Kalman Shan on 05/20/2021 10:49:03

## 2021-05-29 DIAGNOSIS — S93491A Sprain of other ligament of right ankle, initial encounter: Secondary | ICD-10-CM | POA: Diagnosis not present

## 2021-06-03 ENCOUNTER — Other Ambulatory Visit: Payer: Self-pay

## 2021-06-03 ENCOUNTER — Encounter (HOSPITAL_BASED_OUTPATIENT_CLINIC_OR_DEPARTMENT_OTHER): Payer: BC Managed Care – PPO | Admitting: Internal Medicine

## 2021-06-03 DIAGNOSIS — R21 Rash and other nonspecific skin eruption: Secondary | ICD-10-CM

## 2021-06-03 DIAGNOSIS — S81802D Unspecified open wound, left lower leg, subsequent encounter: Secondary | ICD-10-CM | POA: Diagnosis not present

## 2021-06-03 DIAGNOSIS — L98499 Non-pressure chronic ulcer of skin of other sites with unspecified severity: Secondary | ICD-10-CM | POA: Diagnosis not present

## 2021-06-03 NOTE — Progress Notes (Signed)
PHOENYX, MELKA ([id number]) Visit Report for 06/03/2021 Chief Complaint Document Details Patient Name: Date of Service: Anita Rice [name] 06/03/2021 9:30 A M Medical Record Number: [id number] Patient Account Number: [id number] Date of Birth/Sex: Treating RN: 1956/03/14 (65 y.o. Anita Rice Primary Care Provider: Hiram Comber Other Clinician: Referring Provider: Treating Provider/Extender: Harlene Salts Weeks in Treatment: 6 Information Obtained from: Patient Chief Complaint Left lower extremity wound Electronic Signature(s) Signed: 06/03/2021 10:42:44 AM By: Kalman Shan DO Entered By: Kalman Shan on 06/03/2021 10:39:04 -------------------------------------------------------------------------------- HPI Details Patient Name: Date of Service: Anita Rice, [name] [name] 06/03/2021 9:30 A M Medical Record Number: [id number] Patient Account Number: [id number] Date of Birth/Sex: Treating RN: Dec 19, 1955 (65 y.o. Anita Rice Primary Care Provider: Hiram Comber Other Clinician: Referring Provider: Treating Provider/Extender: Harlene Salts Weeks in Treatment: 6 History of Present Illness HPI Description: Admission 7/8 Ms. Anita Rice is a 65 year old female with a past medical history of ADD that presents to the clinic for a 2-1/55-monthhistory of nonhealing left lower extremity wound. She states that she [facility] [employer] [place] and struck her leg on a metal sign. She has been using soap and water and Neosporin to the area. She now has a blackened scab over the area that has not improved. Over the course of the past 2 months she has been on 2 courses of antibiotics. She is currently not taking any antibiotics at this time. She Currently denies signs of infection. 7/22; patient presents for 2-week follow-up. She has been using Santyl daily. She reports improvement to the wound bed. She has no issues or  complaints today. She denies signs of infection. 8/5; patient presents for 2-week follow-up. She has been using Santyl daily. She has no issues or complaints today. She denies signs of infection. 8/19; patient presents for 2-week follow-up she has been using Santyl and Hydrofera Blue daily to the wound bed. She now has some areas of irritation with skin breakdown that she states itches. She denies increased drainage, warmth or erythema to the area. Electronic Signature(s) Signed: 06/03/2021 10:42:44 AM By: HKalman ShanDO Entered By: HKalman Shanon 06/03/2021 10:39:36 -------------------------------------------------------------------------------- Physical Exam Details Patient Name: Date of Service: Anita BullionNA L. 06/03/2021 9:30 A M Medical Record Number: [id number] Account Number: [id number] of Birth/Sex: Treating RN: 907-Oct-1957(65y.o. FNancy FetterPrimary Care Provider: SHiram ComberOther Clinician: Referring Provider: Treating Provider/Extender: HHarlene SaltsWeeks in Treatment: 6 Constitutional respirations regular, non-labored and within target range for patient.. Cardiovascular 2+ dorsalis pedis/posterior tibialis pulses. Psychiatric pleasant and cooperative. Notes Right lower extremity: Previous wound site appears almost closed. She does have pinpoint areas of skin breakdown surrounding the previous wound site. Electronic Signature(s) Signed: 06/03/2021 10:42:44 AM By: HKalman ShanDO Entered By: HKalman Shanon 06/03/2021 10:40:23 -------------------------------------------------------------------------------- Physician Orders Details Patient Name: Date of Service: Anita BullionNA L. 06/03/2021 9:30 A M Medical Record Number: [id number] Account Number: [id number] of Birth/Sex: Treating RN: [birth year](65y.o. FDebby BudPrimary Care Provider: SHiram ComberOther Clinician: Referring  Provider: Treating Provider/Extender: HHarlene SaltsWeeks in Treatment: 6 Verbal / Phone Orders: No Diagnosis Coding ICD-10 Coding Code Description S81.802D Unspecified open wound, left lower leg, subsequent encounter Follow-up Appointments ppointment in 1 week. - Dr.Linsey Arteaga Return A Bathing/ Shower/ Hygiene May shower and wash wound with soap and water. Additional Orders / Instructions Follow Nutritious Diet Wound Treatment  Wound #1 - Lower Leg Wound Laterality: Left, Anterior Cleanser: Soap and Water Every Other Day/30 Days Discharge Instructions: May shower and wash wound with dial antibacterial soap and water prior to dressing change. Peri-Wound Care: Skin Prep Every Other Day/30 Days Discharge Instructions: Use skin prep as directed Topical: Ketoconazole Cream 2% Every Other Day/30 Days Discharge Instructions: Apply Ketoconazole in clinic only as directed. Apply over the counter antifungal cream under the hydrofera blue for itching. Prim Dressing: Hydrofera Blue Ready Foam, 2.5 x2.5 in (Generic) Every Other Day/30 Days ary Discharge Instructions: Apply to wound bed as instructed Secondary Dressing: Bordered Gauze, 2x3.75 in (Generic) Every Other Day/30 Days Discharge Instructions: Apply over primary dressing as directed. Electronic Signature(s) Signed: 06/03/2021 10:42:44 AM By: Kalman Shan DO Entered By: Kalman Shan on 06/03/2021 10:40:44 -------------------------------------------------------------------------------- Problem List Details Patient Name: Date of Service: Anita Rice, [name] [name] 06/03/2021 9:30 A M Medical Record Number: [id number] Patient Account Number: [id number] Date of Birth/Sex: Treating RN: 12/25/1955 (65 y.o. Anita Rice Primary Care Provider: Hiram Comber Other Clinician: Referring Provider: Treating Provider/Extender: Harlene Salts Weeks in Treatment: 6 Active  Problems ICD-10 Encounter Code Description Active Date MDM Diagnosis S81.802D Unspecified open wound, left lower leg, subsequent encounter 05/06/2021 No Yes R21 Rash and other nonspecific skin eruption 06/03/2021 No Yes Inactive Problems ICD-10 Code Description Active Date Inactive Date S81.802A Unspecified open wound, left lower leg, initial encounter 04/22/2021 04/22/2021 Resolved Problems Electronic Signature(s) Signed: 06/03/2021 10:42:44 AM By: Kalman Shan DO Entered By: Kalman Shan on 06/03/2021 10:38:51 -------------------------------------------------------------------------------- Progress Note Details Patient Name: Date of Service: Anita Rice [name] 06/03/2021 9:30 A M Medical Record Number: [id number] Patient Account Number: [id number] Date of Birth/Sex: Treating RN: 12-05-1955 (65 y.o. Anita Rice Primary Care Provider: Hiram Comber Other Clinician: Referring Provider: Treating Provider/Extender: Harlene Salts Weeks in Treatment: 6 Subjective Chief Complaint Information obtained from Patient Left lower extremity wound History of Present Illness (HPI) Admission 7/8 Ms. Janila Arrazola is a 66 year old female with a past medical history of ADD that presents to the clinic for a 2-1/33-monthhistory of nonhealing left lower extremity wound. She states that she was [facility] [place] [employer] [place] and struck her leg on a metal sign. She has been using soap and water and Neosporin to the area. She now has a blackened scab over the area that has not improved. Over the course of the past 2 months she has been on 2 courses of antibiotics. She is currently not taking any antibiotics at this time. She Currently denies signs of infection. 7/22; patient presents for 2-week follow-up. She has been using Santyl daily. She reports improvement to the wound bed. She has no issues or complaints today. She denies signs of infection. 8/5; patient  presents for 2-week follow-up. She has been using Santyl daily. She has no issues or complaints today. She denies signs of infection. 8/19; patient presents for 2-week follow-up she has been using Santyl and Hydrofera Blue daily to the wound bed. She now has some areas of irritation with skin breakdown that she states itches. She denies increased drainage, warmth or erythema to the area. Patient History Information obtained from Patient. Family History Unknown History. Social History Never smoker, Marital Status - Married, Alcohol Use - Never, Drug Use - No History, Caffeine Use - Rarely. Medical History Ear/Nose/Mouth/Throat Patient has history of Chronic sinus problems/congestion Hematologic/Lymphatic Patient has history of Anemia Respiratory Patient has history of Asthma Gastrointestinal Patient has  history of Colitis Medical A Surgical History Notes nd Hematologic/Lymphatic Thrombocytopenia 2016 Objective Constitutional respirations regular, non-labored and within target range for patient.. Vitals Time Taken: 9:44 AM, Height: 60 in, Weight: 118 lbs, BMI: 23, Temperature: 98.3 F, Pulse: 78 bpm, Respiratory Rate: 20 breaths/min, Blood Pressure: 102/68 mmHg. Cardiovascular 2+ dorsalis pedis/posterior tibialis pulses. Psychiatric pleasant and cooperative. General Notes: Right lower extremity: Previous wound site appears almost closed. She does have pinpoint areas of skin breakdown surrounding the previous wound site. Integumentary (Hair, Skin) Wound #1 status is Open. Original cause of wound was Trauma. The date acquired was: 02/04/2021. The wound has been in treatment 6 weeks. The wound is located on the Left,Anterior Lower Leg. The wound measures 1.5cm length x 0.5cm width x 0.1cm depth; 0.589cm^2 area and 0.059cm^3 volume. There is Fat Layer (Subcutaneous Tissue) exposed. There is no tunneling or undermining noted. There is a medium amount of serosanguineous drainage noted.  The wound margin is distinct with the outline attached to the wound base. There is large (67-100%) pink granulation within the wound bed. There is a small (1-33%) amount of necrotic tissue within the wound bed including Adherent Slough. Assessment Active Problems ICD-10 Unspecified open wound, left lower leg, subsequent encounter Rash and other nonspecific skin eruption Patient's wound is almost healed. She has developed pinpoint areas of skin breakdown and I am not sure if this was Santyl causing this or if now she has a mild fungal infection. I recommended stopping Santyl and continuing with Hydrofera Blue. I would like to add antifungal cream to this daily as well. Follow-up in 1 week Plan Follow-up Appointments: Return Appointment in 1 week. - Dr.Tyler Robidoux Bathing/ Shower/ Hygiene: May shower and wash wound with soap and water. Additional Orders / Instructions: Follow Nutritious Diet WOUND #1: - Lower Leg Wound Laterality: Left, Anterior Cleanser: Soap and Water Every Other Day/30 Days Discharge Instructions: May shower and wash wound with dial antibacterial soap and water prior to dressing change. Peri-Wound Care: Skin Prep Every Other Day/30 Days Discharge Instructions: Use skin prep as directed Topical: Ketoconazole Cream 2% Every Other Day/30 Days Discharge Instructions: Apply Ketoconazole in clinic only as directed. Apply over the counter antifungal cream under the hydrofera blue for itching. Prim Dressing: Hydrofera Blue Ready Foam, 2.5 x2.5 in (Generic) Every Other Day/30 Days ary Discharge Instructions: Apply to wound bed as instructed Secondary Dressing: Bordered Gauze, 2x3.75 in (Generic) Every Other Day/30 Days Discharge Instructions: Apply over primary dressing as directed. 1. Stop Santyl 2. Continue Hydrofera Blue 3. Start antifungal ointment 4. Follow-up in 1 week Electronic Signature(s) Signed: 06/03/2021 10:42:44 AM By: Kalman Shan DO Entered By: Kalman Shan on 06/03/2021 10:42:12 -------------------------------------------------------------------------------- HxROS Details Patient Name: Date of Service: Anita Rice, [name] [name] 06/03/2021 9:30 A M Medical Record Number: [id number] Patient Account Number: [id number] Date of Birth/Sex: Treating RN: Jul 31, 1956 (65 y.o. Anita Rice Primary Care Provider: Hiram Comber Other Clinician: Referring Provider: Treating Provider/Extender: Harlene Salts Weeks in Treatment: 6 Information Obtained From Patient Ear/Nose/Mouth/Throat Medical History: Positive for: Chronic sinus problems/congestion Hematologic/Lymphatic Medical History: Positive for: Anemia Past Medical History Notes: Thrombocytopenia 2016 Respiratory Medical History: Positive for: Asthma Gastrointestinal Medical History: Positive for: Colitis HBO Extended History Items Ear/Nose/Mouth/Throat: Chronic sinus problems/congestion Immunizations Pneumococcal Vaccine: Received Pneumococcal Vaccination: No Implantable Devices None Family and Social History Unknown History: Yes; Never smoker; Marital Status - Married; Alcohol Use: Never; Drug Use: No History; Caffeine Use: Rarely; Financial Concerns: No; Food, Clothing or Shelter Needs: No; Support  System Lacking: No; Transportation Concerns: No Electronic Signature(s) Signed: 06/03/2021 10:42:44 AM By: Kalman Shan DO Signed: 06/03/2021 12:21:50 PM By: Levan Hurst RN, BSN Entered By: Kalman Shan on 06/03/2021 10:39:42 -------------------------------------------------------------------------------- SuperBill Details Patient Name: Date of Service: Anita Rice, [name] [name] 06/03/2021 Medical Record Number: [id number] Patient Account Number: [id number] Date of Birth/Sex: Treating RN: 1956/09/15 (65 y.o. Anita Rice Primary Care Provider: Hiram Comber Other Clinician: Referring Provider: Treating Provider/Extender: Harlene Salts Weeks in Treatment: 6 Diagnosis Coding ICD-10 Codes Code Description [phone] Unspecified open wound, left lower leg, subsequent encounter R21 Rash and other nonspecific skin eruption Facility Procedures CPT4 Code: [id number] Description: 99213 - WOUND CARE VISIT-LEV 3 EST PT Modifier: Quantity: 1 Physician Procedures : CPT4 Code Description Modifier [id number] - WC PHYS LEVEL 3 - EST PT ICD-10 Diagnosis Description S81.802D Unspecified open wound, left lower leg, subsequent encounter R21 Rash and other nonspecific skin eruption Quantity: 1 Electronic Signature(s) Signed: 06/03/2021 10:42:44 AM By: Kalman Shan DO Entered By: Kalman Shan on 06/03/2021 10:42:24

## 2021-06-03 NOTE — Progress Notes (Signed)
[name], [name] ([id number]) Visit Report for 06/03/2021 Arrival Information Details Patient Name: Date of Service: Anita Rice [name] 06/03/2021 9:30 A M Medical Record Number: [id number] Patient Account Number: [id number] Date of Birth/Sex: Treating RN: October 06, 1956 (65 y.o. Helene Shoe, Meta.Reding Primary Care Yarima Penman: Hiram Comber Other Clinician: Referring Maalle Starrett: Treating Oluwatimilehin Balfour/Extender: Harlene Salts Weeks in Treatment: 6 Visit Information History Since Last Visit Added or deleted any medications: No Patient Arrived: Ambulatory Any new allergies or adverse reactions: No Arrival Time: 09:44 Had a fall or experienced change in No Accompanied By: alone activities of daily living that may affect Transfer Assistance: None risk of falls: Patient Identification Verified: Yes Signs or symptoms of abuse/neglect since last visito No Secondary Verification Process Completed: Yes Hospitalized since last visit: No Patient Requires Transmission-Based Precautions: No Implantable device outside of the clinic excluding No Patient Has Alerts: No cellular tissue based products placed in the center since last visit: Has Dressing in Place as Prescribed: Yes Pain Present Now: No Electronic Signature(s) Signed: 06/03/2021 12:21:32 PM By: Deon Pilling Entered By: Deon Pilling on 06/03/2021 09:47:15 -------------------------------------------------------------------------------- Clinic Level of Care Assessment Details Patient Name: Date of Service: Anita Rice [name] 06/03/2021 9:30 A M Medical Record Number: [id number] Patient Account Number: [id number] Date of Birth/Sex: Treating RN: Nov 17, 1955 (65 y.o. Anita Rice Primary Care Raley Novicki: Hiram Comber Other Clinician: Referring Marzelle Rutten: Treating Nimai Burbach/Extender: Harlene Salts Weeks in Treatment: 6 Clinic Level of Care Assessment Items TOOL 4 Quantity Score X- 1 0 Use when only an  EandM is performed on FOLLOW-UP visit ASSESSMENTS - Nursing Assessment / Reassessment X- 1 10 Reassessment of Co-morbidities (includes updates in patient status) X- 1 5 Reassessment of Adherence to Treatment Plan ASSESSMENTS - Wound and Skin A ssessment / Reassessment X - Simple Wound Assessment / Reassessment - one wound 1 5 []  - 0 Complex Wound Assessment / Reassessment - multiple wounds X- 1 10 Dermatologic / Skin Assessment (not related to wound area) ASSESSMENTS - Focused Assessment X- 1 5 Circumferential Edema Measurements - multi extremities X- 1 10 Nutritional Assessment / Counseling / Intervention []  - 0 Lower Extremity Assessment (monofilament, tuning fork, pulses) []  - 0 Peripheral Arterial Disease Assessment (using hand held doppler) ASSESSMENTS - Ostomy and/or Continence Assessment and Care []  - 0 Incontinence Assessment and Management []  - 0 Ostomy Care Assessment and Management (repouching, etc.) PROCESS - Coordination of Care X - Simple Patient / Family Education for ongoing care 1 15 []  - 0 Complex (extensive) Patient / Family Education for ongoing care X- 1 10 Staff obtains Programmer, systems, Records, T Results / Process Orders est []  - 0 Staff telephones HHA, Nursing Homes / Clarify orders / etc []  - 0 Routine Transfer to another Facility (non-emergent condition) []  - 0 Routine Hospital Admission (non-emergent condition) []  - 0 New Admissions / Biomedical engineer / Ordering NPWT Apligraf, etc. , []  - 0 Emergency Hospital Admission (emergent condition) X- 1 10 Simple Discharge Coordination []  - 0 Complex (extensive) Discharge Coordination PROCESS - Special Needs []  - 0 Pediatric / Minor Patient Management []  - 0 Isolation Patient Management []  - 0 Hearing / Language / Visual special needs []  - 0 Assessment of Community assistance (transportation, D/C planning, etc.) []  - 0 Additional assistance / Altered mentation []  - 0 Support Surface(s)  Assessment (bed, cushion, seat, etc.) INTERVENTIONS - Wound Cleansing / Measurement X - Simple Wound Cleansing - one wound 1 5 []  - 0 Complex Wound Cleansing -  multiple wounds X- 1 5 Wound Imaging (photographs - any number of wounds) []  - 0 Wound Tracing (instead of photographs) X- 1 5 Simple Wound Measurement - one wound []  - 0 Complex Wound Measurement - multiple wounds INTERVENTIONS - Wound Dressings X - Small Wound Dressing one or multiple wounds 1 10 []  - 0 Medium Wound Dressing one or multiple wounds []  - 0 Large Wound Dressing one or multiple wounds []  - 0 Application of Medications - topical []  - 0 Application of Medications - injection INTERVENTIONS - Miscellaneous []  - 0 External ear exam []  - 0 Specimen Collection (cultures, biopsies, blood, body fluids, etc.) []  - 0 Specimen(s) / Culture(s) sent or taken to Lab for analysis []  - 0 Patient Transfer (multiple staff / Civil Service fast streamer / Similar devices) []  - 0 Simple Staple / Suture removal (25 or less) []  - 0 Complex Staple / Suture removal (26 or more) []  - 0 Hypo / Hyperglycemic Management (close monitor of Blood Glucose) []  - 0 Ankle / Brachial Index (ABI) - do not check if billed separately X- 1 5 Vital Signs Has the patient been seen at the hospital within the last three years: Yes Total Score: 110 Level Of Care: New/Established - Level 3 Electronic Signature(s) Signed: 06/03/2021 12:21:32 PM By: Deon Pilling Entered By: Deon Pilling on 06/03/2021 10:21:05 -------------------------------------------------------------------------------- Encounter Discharge Information Details Patient Name: Date of Service: Anita Rice [name] 06/03/2021 9:30 A M Medical Record Number: [id number] Patient Account Number: [id number] Date of Birth/Sex: Treating RN: [birth year] (65 y.o. Anita Rice Primary Care Natayla Cadenhead: Hiram Comber Other Clinician: Referring Aneesha Holloran: Treating Sheron Tallman/Extender: Harlene Salts Weeks in Treatment: 6 Encounter Discharge Information Items Discharge Condition: Stable Ambulatory Status: Ambulatory Discharge Destination: Home Transportation: Private Auto Accompanied By: self Schedule Follow-up Appointment: Yes Clinical Summary of Care: Electronic Signature(s) Signed: 06/03/2021 12:21:32 PM By: Deon Pilling Entered By: Deon Pilling on 06/03/2021 10:23:23 -------------------------------------------------------------------------------- Lower Extremity Assessment Details Patient Name: Date of Service: Anita Rice [name] 06/03/2021 9:30 A M Medical Record Number: [id number] Patient Account Number: [id number] Date of Birth/Sex: Treating RN: 01-Mar-1956 (65 y.o. Anita Rice Primary Care Nivedita Mirabella: Hiram Comber Other Clinician: Referring Rikayla Demmon: Treating Bresha Hosack/Extender: Harlene Salts Weeks in Treatment: 6 Edema Assessment Assessed: [Left: Yes] [Right: No] Edema: [Left: N] [Right: o] Calf Left: Right: Point of Measurement: 28 cm From Medial Instep 33 cm Ankle Left: Right: Point of Measurement: 9 cm From Medial Instep 19 cm Vascular Assessment Pulses: Dorsalis Pedis Palpable: [Left:Yes] Electronic Signature(s) Signed: 06/03/2021 12:21:32 PM By: Deon Pilling Entered By: Deon Pilling on 06/03/2021 09:48:00 -------------------------------------------------------------------------------- Multi Wound Chart Details Patient Name: Date of Service: Anita Rice [name] 06/03/2021 9:30 A M Medical Record Number: [id number] Patient Account Number: [id number] Date of Birth/Sex: Treating RN: Jul 05, 1956 (65 y.o. Nancy Fetter Primary Care Xuan Mateus: Hiram Comber Other Clinician: Referring Mayfield Schoene: Treating Chanel Mcadams/Extender: Harlene Salts Weeks in Treatment: 6 Vital Signs Height(in): 60 Pulse(bpm): 78 Weight(lbs): 118 Blood Pressure(mmHg): 102/68 Body Mass Index(BMI):  23 Temperature(F): 98.3 Respiratory Rate(breaths/min): 20 Photos: [N/A:N/A] Left, Anterior Lower Leg N/A N/A Wound Location: Trauma N/A N/A Wounding Event: Trauma, Other N/A N/A Primary Etiology: Chronic sinus problems/congestion, N/A N/A Comorbid History: Anemia, Asthma, Colitis 02/04/2021 N/A N/A Date Acquired: 6 N/A N/A Weeks of Treatment: Open N/A N/A Wound Status: 1.5x0.5x0.1 N/A N/A Measurements L x W x D (cm) 0.589 N/A N/A A (cm) : rea 0.059 N/A N/A Volume (cm) : 79.20% N/A N/A %  Reduction in Area: 79.20% N/A N/A % Reduction in Volume: Full Thickness Without Exposed N/A N/A Classification: Support Structures Medium N/A N/A Exudate Amount: Serosanguineous N/A N/A Exudate Type: red, brown N/A N/A Exudate Color: Distinct, outline attached N/A N/A Wound Margin: Large (67-100%) N/A N/A Granulation Amount: Pink N/A N/A Granulation Quality: Small (1-33%) N/A N/A Necrotic Amount: Fat Layer (Subcutaneous Tissue): Yes N/A N/A Exposed Structures: Fascia: No Tendon: No Muscle: No Joint: No Bone: No Large (67-100%) N/A N/A Epithelialization: Treatment Notes Wound #1 (Lower Leg) Wound Laterality: Left, Anterior Cleanser Soap and Water Discharge Instruction: May shower and wash wound with dial antibacterial soap and water prior to dressing change. Peri-Wound Care Skin Prep Discharge Instruction: Use skin prep as directed Topical Ketoconazole Cream 2% Discharge Instruction: Apply Ketoconazole in clinic only as directed. Apply over the counter antifungal cream under the hydrofera blue for itching. Primary Dressing Hydrofera Blue Ready Foam, 2.5 x2.5 in Discharge Instruction: Apply to wound bed as instructed Secondary Dressing Bordered Gauze, 2x3.75 in Discharge Instruction: Apply over primary dressing as directed. Secured With Compression Wrap Compression Stockings Add-Ons Electronic Signature(s) Signed: 06/03/2021 10:42:44 AM By: Kalman Shan DO Signed: 06/03/2021 12:21:50 PM By: Levan Hurst RN, BSN Entered By: Kalman Shan on 06/03/2021 10:38:57 -------------------------------------------------------------------------------- Multi-Disciplinary Care Plan Details Patient Name: Date of Service: Anita Rice [name] 06/03/2021 9:30 A M Medical Record Number: [id number] Patient Account Number: [id number] Date of Birth/Sex: Treating RN: 30-Aug-1956 (65 y.o. Anita Rice Primary Care Dotty Gonzalo: Hiram Comber Other Clinician: Referring Rodrigo Mcgranahan: Treating Lorraine Cimmino/Extender: Ilona Sorrel in Treatment: 6 [place] reviewed with physician Active Inactive Wound/Skin Impairment Nursing Diagnoses: Impaired tissue integrity Knowledge deficit related to ulceration/compromised skin integrity Goals: Patient/caregiver will verbalize understanding of skin care regimen Date Initiated: 04/22/2021 Date Inactivated: 05/20/2021 Target Resolution Date: 05/20/2021 Goal Status: Met Ulcer/skin breakdown will have a volume reduction of 30% by week 4 Date Initiated: 04/22/2021 Date Inactivated: 05/20/2021 Target Resolution Date: 05/20/2021 Goal Status: Met Ulcer/skin breakdown will have a volume reduction of 50% by week 8 Date Initiated: 05/20/2021 Target Resolution Date: 07/07/2021 Goal Status: Active Interventions: Assess patient/caregiver ability to obtain necessary supplies Assess patient/caregiver ability to perform ulcer/skin care regimen upon admission and as needed Assess ulceration(s) every visit Provide education on ulcer and skin care Notes: Electronic Signature(s) Signed: 06/03/2021 12:21:32 PM By: Deon Pilling Entered By: Deon Pilling on 06/03/2021 09:55:14 -------------------------------------------------------------------------------- Pain Assessment Details Patient Name: Date of Service: Anita Rice [name] 06/03/2021 9:30 A M Medical Record Number: [id number] Patient  Account Number: [id number] Date of Birth/Sex: Treating RN: 10/25/1955 (65 y.o. Anita Rice Primary Care Arrielle Mcginn: Hiram Comber Other Clinician: Referring Arneda Sappington: Treating Avleen Bordwell/Extender: Harlene Salts Weeks in Treatment: 6 Active Problems Location of Pain Severity and Description of Pain Patient Has Paino No Site Locations Rate the pain. Current Pain Level: 0 Pain Management and Medication Current Pain Management: Medication: No Cold Application: No Rest: No Massage: No Activity: No T.E.N.S.: No Heat Application: No Leg drop or elevation: No Is the Current Pain Management Adequate: Adequate How does your wound impact your activities of daily livingo Sleep: No Bathing: No Appetite: No Relationship With Others: No Bladder Continence: No Emotions: No Bowel Continence: No Work: No Toileting: No Drive: No Dressing: No Hobbies: No Electronic Signature(s) Signed: 06/03/2021 12:21:32 PM By: Deon Pilling Entered By: Deon Pilling on 06/03/2021 09:47:26 -------------------------------------------------------------------------------- Patient/Caregiver Education Details Patient Name: Date of Service: Anita Rice [name] 8/19/2022andnbsp9:30 A M Medical Record  Number: [id number] Patient Account Number: [id number] Date of Birth/Gender: Treating RN: [birth year] (65 y.o. Anita Rice Primary Care Physician: Hiram Comber Other Clinician: Referring Physician: Treating Physician/Extender: Ilona Sorrel in Treatment: 6 Education Assessment Education Provided To: Patient Education Topics Provided Wound/Skin Impairment: Handouts: Skin Care Do's and Dont's Methods: Explain/Verbal Responses: Reinforcements needed Electronic Signature(s) Signed: 06/03/2021 12:21:32 PM By: Deon Pilling Entered By: Deon Pilling on 06/03/2021  09:55:24 -------------------------------------------------------------------------------- Wound Assessment Details Patient Name: Date of Service: Anita Rice [name] 06/03/2021 9:30 A M Medical Record Number: [id number] Patient Account Number: [id number] Date of Birth/Sex: Treating RN: 1956-03-01 (65 y.o. Anita Rice Primary Care Heily Carlucci: Hiram Comber Other Clinician: Referring Marcellina Jonsson: Treating Eiko Mcgowen/Extender: Harlene Salts Weeks in Treatment: 6 Wound Status Wound Number: 1 Primary Etiology: Trauma, Other Wound Location: Left, Anterior Lower Leg Wound Status: Open Wounding Event: Trauma Comorbid Chronic sinus problems/congestion, Anemia, Asthma, History: Colitis Date Acquired: 02/04/2021 Weeks Of Treatment: 6 Clustered Wound: No Photos Wound Measurements Length: (cm) 1.5 Width: (cm) 0.5 Depth: (cm) 0.1 Area: (cm) 0.589 Volume: (cm) 0.059 % Reduction in Area: 79.2% % Reduction in Volume: 79.2% Epithelialization: Large (67-100%) Tunneling: No Undermining: No Wound Description Classification: Full Thickness Without Exposed Support Structures Wound Margin: Distinct, outline attached Exudate Amount: Medium Exudate Type: Serosanguineous Exudate Color: red, brown Foul Odor After Cleansing: No Slough/Fibrino Yes Wound Bed Granulation Amount: Large (67-100%) Exposed Structure Granulation Quality: Pink Fascia Exposed: No Necrotic Amount: Small (1-33%) Fat Layer (Subcutaneous Tissue) Exposed: Yes Necrotic Quality: Adherent Slough Tendon Exposed: No Muscle Exposed: No Joint Exposed: No Bone Exposed: No Treatment Notes Wound #1 (Lower Leg) Wound Laterality: Left, Anterior Cleanser Soap and Water Discharge Instruction: May shower and wash wound with dial antibacterial soap and water prior to dressing change. Peri-Wound Care Skin Prep Discharge Instruction: Use skin prep as directed Topical Ketoconazole Cream 2% Discharge  Instruction: Apply Ketoconazole in clinic only as directed. Apply over the counter antifungal cream under the hydrofera blue for itching. Primary Dressing Hydrofera Blue Ready Foam, 2.5 x2.5 in Discharge Instruction: Apply to wound bed as instructed Secondary Dressing Bordered Gauze, 2x3.75 in Discharge Instruction: Apply over primary dressing as directed. Secured With Compression Wrap Compression Stockings Add-Ons Electronic Signature(s) Signed: 06/03/2021 12:21:32 PM By: Deon Pilling Entered By: Deon Pilling on 06/03/2021 09:53:52 -------------------------------------------------------------------------------- Vitals Details Patient Name: Date of Service: Anita Rice [name] 06/03/2021 9:30 A M Medical Record Number: [id number] Patient Account Number: [id number] Date of Birth/Sex: Treating RN: January 20, 1956 (65 y.o. Anita Rice Primary Care Caio Devera: Hiram Comber Other Clinician: Referring Savina Olshefski: Treating Armanii Urbanik/Extender: Harlene Salts Weeks in Treatment: 6 Vital Signs Time Taken: 09:44 Temperature (F): 98.3 Height (in): 60 Pulse (bpm): 78 Weight (lbs): 118 Respiratory Rate (breaths/min): 20 Body Mass Index (BMI): 23 Blood Pressure (mmHg): 102/68 Reference Range: 80 - 120 mg / dl Electronic Signature(s) Signed: 06/03/2021 12:21:32 PM By: Deon Pilling Entered By: Deon Pilling on 06/03/2021 09:48:57

## 2021-06-12 DIAGNOSIS — U071 COVID-19: Secondary | ICD-10-CM | POA: Diagnosis not present

## 2021-06-13 ENCOUNTER — Ambulatory Visit (HOSPITAL_BASED_OUTPATIENT_CLINIC_OR_DEPARTMENT_OTHER): Payer: BC Managed Care – PPO | Admitting: Internal Medicine

## 2021-06-21 ENCOUNTER — Encounter (HOSPITAL_BASED_OUTPATIENT_CLINIC_OR_DEPARTMENT_OTHER): Payer: BC Managed Care – PPO | Attending: Internal Medicine | Admitting: Internal Medicine

## 2021-06-27 ENCOUNTER — Encounter (HOSPITAL_BASED_OUTPATIENT_CLINIC_OR_DEPARTMENT_OTHER): Payer: BC Managed Care – PPO | Admitting: Internal Medicine

## 2021-06-30 ENCOUNTER — Other Ambulatory Visit: Payer: Self-pay

## 2021-06-30 ENCOUNTER — Encounter (HOSPITAL_BASED_OUTPATIENT_CLINIC_OR_DEPARTMENT_OTHER): Payer: BC Managed Care – PPO | Attending: Internal Medicine | Admitting: Internal Medicine

## 2021-06-30 DIAGNOSIS — S81802A Unspecified open wound, left lower leg, initial encounter: Secondary | ICD-10-CM | POA: Insufficient documentation

## 2021-06-30 DIAGNOSIS — Y9389 Activity, other specified: Secondary | ICD-10-CM | POA: Insufficient documentation

## 2021-06-30 DIAGNOSIS — X58XXXA Exposure to other specified factors, initial encounter: Secondary | ICD-10-CM | POA: Insufficient documentation

## 2021-06-30 DIAGNOSIS — R21 Rash and other nonspecific skin eruption: Secondary | ICD-10-CM

## 2021-06-30 DIAGNOSIS — S81802D Unspecified open wound, left lower leg, subsequent encounter: Secondary | ICD-10-CM

## 2021-06-30 NOTE — Progress Notes (Signed)
Anita, Rice ([id number]) Visit Report for 06/30/2021 Chief Complaint Document Details Patient Name: Date of Service: Anita Rice [name] 06/30/2021 3:15 PM Medical Record Number: [id number] Patient Account Number: [id number] Date of Birth/Sex: Treating RN: October 16, 1956 (65 y.o. Anita Rice Primary Care Provider: Hiram Rice Other Clinician: Referring Provider: Treating Provider/Extender: Anita Rice Weeks in Treatment: 9 Information Obtained from: Patient Chief Complaint Left lower extremity wound Electronic Signature(s) Signed: 06/30/2021 4:30:08 PM By: Anita Shan DO Entered By: Anita Rice on 06/30/2021 16:26:28 -------------------------------------------------------------------------------- HPI Details Patient Name: Date of Service: Anita Rice [name] 06/30/2021 3:15 PM Medical Record Number: [id number] Patient Account Number: [id number] Date of Birth/Sex: Treating RN: November 28, 1955 (65 y.o. Anita Rice Primary Care Provider: Hiram Rice Other Clinician: Referring Provider: Treating Provider/Extender: Anita Rice Weeks in Treatment: 9 History of Present Illness HPI Description: Admission 7/8 Anita Rice is a 65 year old female with a past medical history of ADD that presents to the clinic for a 2-1/33-monthhistory of nonhealing left lower extremity wound. She states that she [place] [facility] [employer] and struck her leg on a metal sign. She has been using soap and water and Neosporin to the area. She now has a blackened scab over the area that has not improved. Over the course of the past 2 months she has been on 2 courses of antibiotics. She is currently not taking any antibiotics at this time. She Currently denies signs of infection. 7/22; patient presents for 2-week follow-up. She has been using Santyl daily. She reports improvement to the wound bed. She has no issues or  complaints today. She denies signs of infection. 8/5; patient presents for 2-week follow-up. She has been using Santyl daily. She has no issues or complaints today. She denies signs of infection. 8/19; patient presents for 2-week follow-up she has been using Santyl and Hydrofera Blue daily to the wound bed. She now has some areas of irritation with skin breakdown that she states itches. She denies increased drainage, warmth or erythema to the area. 9/15; patient presents for follow-up. She has been using Hydrofera Blue and antifungal cream. She reports that the wound is closed. She has no issues or complaints today. Electronic Signature(s) Signed: 06/30/2021 4:30:08 PM By: HKalman ShanDO Entered By: HKalman Shanon 06/30/2021 16:27:28 -------------------------------------------------------------------------------- Physical Exam Details Patient Name: Date of Service: LDorise BullionNA L. 06/30/2021 3:15 PM Medical Record Number: [id number] Account Number: [id number] of Birth/Sex: Treating RN: 911/29/1957(65y.o. FBenjaman LobePrimary Care Provider: SHiram ComberOther Clinician: Referring Provider: Treating Provider/Extender: HHarlene SaltsWeeks in Treatment: 9 Constitutional respirations regular, non-labored and within target range for patient..Marland KitchenPsychiatric pleasant and cooperative. Notes Right lower extremity: Epithelialization to previous wound site. No areas of skin breakdown or irritation noted. Electronic Signature(s) Signed: 06/30/2021 4:30:08 PM By: HKalman ShanDO Entered By: HKalman Shanon 06/30/2021 16:28:38 -------------------------------------------------------------------------------- Physician Orders Details Patient Name: Date of Service: LDorise BullionNA L. 06/30/2021 3:15 PM Medical Record Number: [id number] Account Number: [id number] of Birth/Sex: Treating RN: 919-Jan-1957(65y.o. FBenjaman LobePrimary Care Provider: SHiram ComberOther Clinician: Referring Provider: Treating Provider/Extender: HHarlene SaltsWeeks in Treatment: 9 Verbal / Phone Orders: No Diagnosis Coding ICD-10 Coding Code Description S81.802D Unspecified open wound, left lower leg, subsequent encounter R21 Rash and other nonspecific skin eruption [facility] [place] [place] Signed:  06/30/2021 4:30:08 PM By: Anita Shan DO Entered By: Anita Rice on 06/30/2021 16:28:53 -------------------------------------------------------------------------------- Problem List Details Patient Name: Date of Service: [name], [name] [name] 06/30/2021 3:15 PM Medical Record Number: [id number] Patient Account Number: [id number] Date of Birth/Sex: Treating RN: 1956-02-13 (65 y.o. Tonita Phoenix, Anita Rice Primary Care Provider: Hiram Rice Other Clinician: Referring Provider: Treating Provider/Extender: Anita Rice Weeks in Treatment: 9 Active Problems ICD-10 Encounter Code Description Active Date MDM Diagnosis S81.802D Unspecified open wound, left lower leg, subsequent encounter 05/06/2021 No Yes R21 Rash and other nonspecific skin eruption 06/03/2021 No Yes Inactive Problems ICD-10 Code Description Active Date Inactive Date S81.802A Unspecified open wound, left lower leg, initial encounter 04/22/2021 04/22/2021 Resolved Problems Electronic Signature(s) Signed: 06/30/2021 4:30:08 PM By: Anita Shan DO Entered By: Anita Rice on 06/30/2021 16:26:15 -------------------------------------------------------------------------------- Progress Note Details Patient Name: Date of Service: Anita Rice [name] 06/30/2021 3:15 PM Medical Record Number: [id number] Patient Account Number: [id number] Date of Birth/Sex: Treating RN: 09/17/1956 (65 y.o. Anita Rice Primary Care Provider:  Hiram Rice Other Clinician: Referring Provider: Treating Provider/Extender: Anita Rice Weeks in Treatment: 9 Subjective Chief Complaint Information obtained from Patient Left lower extremity wound History of Present Illness (HPI) Admission 7/8 Anita Rice is a 65 year old female with a past medical history of ADD that presents to the clinic for a 2-1/57-monthhistory of nonhealing left lower extremity wound. She states that she was at [place] [employer] [place] and struck her leg on a metal sign. She has been using soap and water and Neosporin to the area. She now has a blackened scab over the area that has not improved. Over the course of the past 2 months she has been on 2 courses of antibiotics. She is currently not taking any antibiotics at this time. She Currently denies signs of infection. 7/22; patient presents for 2-week follow-up. She has been using Santyl daily. She reports improvement to the wound bed. She has no issues or complaints today. She denies signs of infection. 8/5; patient presents for 2-week follow-up. She has been using Santyl daily. She has no issues or complaints today. She denies signs of infection. 8/19; patient presents for 2-week follow-up she has been using Santyl and Hydrofera Blue daily to the wound bed. She now has some areas of irritation with skin breakdown that she states itches. She denies increased drainage, warmth or erythema to the area. 9/15; patient presents for follow-up. She has been using Hydrofera Blue and antifungal cream. She reports that the wound is closed. She has no issues or complaints today. Patient History Information obtained from Patient. Family History Unknown History. Social History Never smoker, Marital Status - Married, Alcohol Use - Never, Drug Use - No History, Caffeine Use - Rarely. Medical History Ear/Nose/Mouth/Throat Patient has history of Chronic sinus  problems/congestion Hematologic/Lymphatic Patient has history of Anemia Respiratory Patient has history of Asthma Gastrointestinal Patient has history of Colitis Medical A Surgical History Notes nd Hematologic/Lymphatic Thrombocytopenia 2016 Objective Constitutional respirations regular, non-labored and within target range for patient.. Vitals Time Taken: 3:43 PM, Height: 60 in, Weight: 118 lbs, BMI: 23, Temperature: 98.7 F, Pulse: 74 bpm, Respiratory Rate: 17 breaths/min, Blood Pressure: 102/64 mmHg. Psychiatric pleasant and cooperative. General Notes: Right lower extremity: Epithelialization to previous wound site. No areas of skin breakdown or irritation noted. Integumentary (Hair, Skin) Wound #1 status is Open. Original cause of wound was Trauma. The date acquired was: 02/04/2021. The wound has been in treatment  9 weeks. The wound is located on the Left,Anterior Lower Leg. The wound measures 0cm length x 0cm width x 0cm depth; 0cm^2 area and 0cm^3 volume. There is Fat Layer (Subcutaneous Tissue) exposed. There is a medium amount of serosanguineous drainage noted. The wound margin is distinct with the outline attached to the wound base. There is large (67-100%) pink granulation within the wound bed. There is a small (1-33%) amount of necrotic tissue within the wound bed including Adherent Slough. Assessment Active Problems ICD-10 Unspecified open wound, left lower leg, subsequent encounter Rash and other nonspecific skin eruption Patient has done well with Hydrofera Blue and antifungal cream. The area is closed. She knows to follow-up as needed. Plan Discharge From [facility] Services: Discharge from [place] 1. Discharge from clinic due to closed wound 2. Follow-up as needed Electronic Signature(s) Signed: 06/30/2021 4:30:08 PM By: Anita Shan DO Entered By: Anita Rice on 06/30/2021  16:29:34 -------------------------------------------------------------------------------- HxROS Details Patient Name: Date of Service: [name], [name] [name] 06/30/2021 3:15 PM Medical Record Number: [id number] Patient Account Number: [id number] Date of Birth/Sex: Treating RN: Oct 11, 1956 (65 y.o. Anita Rice Primary Care Provider: Hiram Rice Other Clinician: Referring Provider: Treating Provider/Extender: Anita Rice Weeks in Treatment: 9 Information Obtained From Patient Ear/Nose/Mouth/Throat Medical History: Positive for: Chronic sinus problems/congestion Hematologic/Lymphatic Medical History: Positive for: Anemia Past Medical History Notes: Thrombocytopenia 2016 Respiratory Medical History: Positive for: Asthma Gastrointestinal Medical History: Positive for: Colitis HBO Extended History Items Ear/Nose/Mouth/Throat: Chronic sinus problems/congestion Immunizations Pneumococcal Vaccine: Received Pneumococcal Vaccination: No Implantable Devices None Family and Social History Unknown History: Yes; Never smoker; Marital Status - Married; Alcohol Use: Never; Drug Use: No History; Caffeine Use: Rarely; Financial Concerns: No; Food, Clothing or Shelter Needs: No; Support System Lacking: No; Transportation Concerns: No Electronic Signature(s) Signed: 06/30/2021 4:30:08 PM By: Anita Shan DO Signed: 06/30/2021 4:34:42 PM By: Rhae Hammock RN Entered By: Anita Rice on 06/30/2021 16:27:35 -------------------------------------------------------------------------------- SuperBill Details Patient Name: Date of Service: [name], [name] [name] 06/30/2021 Medical Record Number: [id number] Patient Account Number: [id number] Date of Birth/Sex: Treating RN: [birth year] (65 y.o. Anita Rice Primary Care Provider: Hiram Rice Other Clinician: Referring Provider: Treating Provider/Extender: Anita Rice Weeks in Treatment: 9 Diagnosis Coding ICD-10 Codes Code Description [phone] Unspecified open wound, left lower leg, subsequent encounter R21 Rash and other nonspecific skin eruption Facility Procedures CPT4 Code: [id number] Description: 99213 - WOUND CARE VISIT-LEV 3 EST PT Modifier: Quantity: 1 Physician Procedures : CPT4 Code Description Modifier [id number] - WC PHYS LEVEL 3 - EST PT ICD-10 Diagnosis Description S81.802D Unspecified open wound, left lower leg, subsequent encounter R21 Rash and other nonspecific skin eruption Quantity: 1 Electronic Signature(s) Signed: 06/30/2021 4:30:08 PM By: Anita Shan DO Entered By: Anita Rice on 06/30/2021 16:29:46

## 2021-06-30 NOTE — Progress Notes (Signed)
Anita Rice ([id number]) Visit Report for 06/30/2021 Arrival Information Details Patient Name: Date of Service: Anita Rice Anita L. 06/30/2021 3:15 PM Medical Record Number: [id number] Patient Account Number: [id number] Date of Birth/Sex: Treating RN: 1956/08/31 (65 y.o. Tonita Phoenix, Lauren Primary Care Azalea Cedar: Hiram Comber Other Clinician: Referring Sayvon Arterberry: Treating Deveney Bayon/Extender: Harlene Salts Weeks in Treatment: 9 Visit Information History Since Last Visit Added or deleted any medications: No Patient Arrived: Ambulatory Any new allergies or adverse reactions: No Arrival Time: 15:36 Had a fall or experienced change in No Accompanied By: self activities of daily living that may affect Transfer Assistance: None risk of falls: Patient Identification Verified: Yes Signs or symptoms of abuse/neglect since last visito No Secondary Verification Process Completed: Yes Hospitalized since last visit: No Patient Requires Transmission-Based Precautions: No Implantable device outside of the clinic excluding No Patient Has Alerts: No cellular tissue based products placed in the center since last visit: Has Dressing in Place as Prescribed: Yes Pain Present Now: No Electronic Signature(s) Signed: 06/30/2021 4:34:42 PM By: Rhae Hammock RN Entered By: Rhae Hammock on 06/30/2021 15:36:44 -------------------------------------------------------------------------------- Clinic Level of Care Assessment Details Patient Name: Date of Service: Anita Rice Anita L. 06/30/2021 3:15 PM Medical Record Number: [id number] Patient Account Number: [id number] Date of Birth/Sex: Treating RN: 15-Jan-1956 (65 y.o. Tonita Phoenix, Lauren Primary Care Johnpatrick Jenny: Hiram Comber Other Clinician: Referring Christia Coaxum: Treating Brittain Smithey/Extender: Harlene Salts Weeks in Treatment: 9 Clinic Level of Care Assessment Items TOOL 4 Quantity Score X- 1 0 Use  when only an EandM is performed on FOLLOW-UP visit ASSESSMENTS - Nursing Assessment / Reassessment X- 1 10 Reassessment of Co-morbidities (includes updates in patient status) X- 1 5 Reassessment of Adherence to Treatment Plan ASSESSMENTS - Wound and Skin A ssessment / Reassessment X - Simple Wound Assessment / Reassessment - one wound 1 5 []  - 0 Complex Wound Assessment / Reassessment - multiple wounds X- 1 10 Dermatologic / Skin Assessment (not related to wound area) ASSESSMENTS - Focused Assessment []  - 0 Circumferential Edema Measurements - multi extremities []  - 0 Nutritional Assessment / Counseling / Intervention []  - 0 Lower Extremity Assessment (monofilament, tuning fork, pulses) []  - 0 Peripheral Arterial Disease Assessment (using hand held doppler) ASSESSMENTS - Ostomy and/or Continence Assessment and Care []  - 0 Incontinence Assessment and Management []  - 0 Ostomy Care Assessment and Management (repouching, etc.) PROCESS - Coordination of Care X - Simple Patient / Family Education for ongoing care 1 15 []  - 0 Complex (extensive) Patient / Family Education for ongoing care X- 1 10 Staff obtains Programmer, systems, Records, T Results / Process Orders est []  - 0 Staff telephones HHA, Nursing Homes / Clarify orders / etc []  - 0 Routine Transfer to another Facility (non-emergent condition) []  - 0 Routine Hospital Admission (non-emergent condition) []  - 0 New Admissions / Biomedical engineer / Ordering NPWT Apligraf, etc. , []  - 0 Emergency Hospital Admission (emergent condition) X- 1 10 Simple Discharge Coordination []  - 0 Complex (extensive) Discharge Coordination PROCESS - Special Needs []  - 0 Pediatric / Minor Patient Management []  - 0 Isolation Patient Management []  - 0 Hearing / Language / Visual special needs []  - 0 Assessment of Community assistance (transportation, D/C planning, etc.) []  - 0 Additional assistance / Altered mentation []  - 0 Support  Surface(s) Assessment (bed, cushion, seat, etc.) INTERVENTIONS - Wound Cleansing / Measurement X - Simple Wound Cleansing - one wound 1 5 []  - 0 Complex Wound Cleansing - multiple  wounds X- 1 5 Wound Imaging (photographs - any number of wounds) []  - 0 Wound Tracing (instead of photographs) X- 1 5 Simple Wound Measurement - one wound []  - 0 Complex Wound Measurement - multiple wounds INTERVENTIONS - Wound Dressings []  - 0 Small Wound Dressing one or multiple wounds []  - 0 Medium Wound Dressing one or multiple wounds []  - 0 Large Wound Dressing one or multiple wounds []  - 0 Application of Medications - topical []  - 0 Application of Medications - injection INTERVENTIONS - Miscellaneous []  - 0 External ear exam []  - 0 Specimen Collection (cultures, biopsies, blood, body fluids, etc.) []  - 0 Specimen(s) / Culture(s) sent or taken to Lab for analysis []  - 0 Patient Transfer (multiple staff / Civil Service fast streamer / Similar devices) []  - 0 Simple Staple / Suture removal (25 or less) []  - 0 Complex Staple / Suture removal (26 or more) []  - 0 Hypo / Hyperglycemic Management (close monitor of Blood Glucose) []  - 0 Ankle / Brachial Index (ABI) - do not check if billed separately X- 1 5 Vital Signs Has the patient been seen at the hospital within the last three years: Yes Total Score: 85 Level Of Care: New/Established - Level 3 Electronic Signature(s) Signed: 06/30/2021 4:34:42 PM By: Rhae Hammock RN Entered By: Rhae Hammock on 06/30/2021 15:46:15 -------------------------------------------------------------------------------- Encounter Discharge Information Details Patient Name: Date of Service: Anita Rice, Anita Rice Anita L. 06/30/2021 3:15 PM Medical Record Number: [id number] Patient Account Number: [id number] Date of Birth/Sex: Treating RN: Feb 05, 1956 (65 y.o. [name] Primary Care Avilene Marrin: Hiram Comber Other Clinician: Referring Melesa Lecy: Treating Kunta Hilleary/Extender:  Harlene Salts Weeks in Treatment: 9 Encounter Discharge Information Items Discharge Condition: Stable Ambulatory Status: Ambulatory Discharge Destination: Home Transportation: Private Auto Accompanied By: self Schedule Follow-up Appointment: Yes Clinical Summary of Care: Patient Declined Electronic Signature(s) Signed: 06/30/2021 4:34:42 PM By: Rhae Hammock RN Entered By: Rhae Hammock on 06/30/2021 15:49:27 -------------------------------------------------------------------------------- Lower Extremity Assessment Details Patient Name: Date of Service: Anita Rice Anita L. 06/30/2021 3:15 PM Medical Record Number: [id number] Patient Account Number: [id number] Date of Birth/Sex: Treating RN: 06-08-1956 (65 y.o. Tonita Phoenix, Lauren Primary Care Dimond Crotty: Hiram Comber Other Clinician: Referring Fenton Candee: Treating Leslee Haueter/Extender: Harlene Salts Weeks in Treatment: 9 Edema Assessment Assessed: [Left: Yes] [Right: No] Edema: [Left: N] [Right: o] Calf Left: Right: Point of Measurement: 28 cm From Medial Instep 33 cm Ankle Left: Right: Point of Measurement: 9 cm From Medial Instep 19 cm Vascular Assessment Pulses: Dorsalis Pedis Palpable: [Left:Yes] Posterior Tibial Palpable: [Left:Yes Yes] Electronic Signature(s) Signed: 06/30/2021 4:34:42 PM By: Rhae Hammock RN Entered By: Rhae Hammock on 06/30/2021 15:37:06 -------------------------------------------------------------------------------- Multi Wound Chart Details Patient Name: Date of Service: Anita Rice, Anita Rice Anita L. 06/30/2021 3:15 PM Medical Record Number: [id number] Patient Account Number: [id number] Date of Birth/Sex: Treating RN: [birth year] (65 y.o. Tonita Phoenix, Lauren Primary Care Keneth Borg: Hiram Comber Other Clinician: Referring Keenan Dimitrov: Treating Dariela Stoker/Extender: Harlene Salts Weeks in Treatment: 9 Vital  Signs Height(in): 60 Pulse(bpm): 74 Weight(lbs): 118 Blood Pressure(mmHg): 102/64 Body Mass Index(BMI): 23 Temperature(F): 98.7 Respiratory Rate(breaths/min): 17 Photos: [N/A:N/A] Left, Anterior Lower Leg N/A N/A Wound Location: Trauma N/A N/A Wounding Event: Trauma, Other N/A N/A Primary Etiology: Chronic sinus problems/congestion, N/A N/A Comorbid History: Anemia, Asthma, Colitis 02/04/2021 N/A N/A Date Acquired: 9 N/A N/A Weeks of Treatment: Open N/A N/A Wound Status: 0x0x0 N/A N/A Measurements L x W x D (cm) 0 N/A N/A A (cm) : rea 0 N/A N/A Volume (  cm) : 100.00% N/A N/A % Reduction in Area: 100.00% N/A N/A % Reduction in Volume: Full Thickness Without Exposed N/A N/A Classification: Support Structures Medium N/A N/A Exudate Amount: Serosanguineous N/A N/A Exudate Type: red, brown N/A N/A Exudate Color: Distinct, outline attached N/A N/A Wound Margin: Large (67-100%) N/A N/A Granulation Amount: Pink N/A N/A Granulation Quality: Small (1-33%) N/A N/A Necrotic Amount: Fat Layer (Subcutaneous Tissue): Yes N/A N/A Exposed Structures: Fascia: No Tendon: No Muscle: No Joint: No Bone: No Large (67-100%) N/A N/A Epithelialization: Treatment Notes Electronic Signature(s) Signed: 06/30/2021 4:30:08 PM By: Kalman Shan DO Signed: 06/30/2021 4:34:42 PM By: Rhae Hammock RN Entered By: Kalman Shan on 06/30/2021 16:26:21 -------------------------------------------------------------------------------- Multi-Disciplinary Care Plan Details Patient Name: Date of Service: Anita Rice, Anita Rice Anita L. 06/30/2021 3:15 PM Medical Record Number: [id number] Patient Account Number: [id number] Date of Birth/Sex: Treating RN: Mar 10, 1956 (65 y.o. [name] Primary Care Evianna Chandran: Hiram Comber Other Clinician: Referring Enslie Sahota: Treating Travante Knee/Extender: Ilona Sorrel in Treatment: [place]  reviewed with physician Active Inactive Electronic Signature(s) Signed: 06/30/2021 4:34:42 PM By: Rhae Hammock RN Entered By: Rhae Hammock on 06/30/2021 15:45:27 -------------------------------------------------------------------------------- Pain Assessment Details Patient Name: Date of Service: Anita Rice Anita L. 06/30/2021 3:15 PM Medical Record Number: [id number] Patient Account Number: [id number] Date of Birth/Sex: Treating RN: 1956/05/28 (65 y.o. [name] Primary Care Milisa Kimbell: Hiram Comber Other Clinician: Referring Ruxin Ransome: Treating Hayle Parisi/Extender: Harlene Salts Weeks in Treatment: 9 Active Problems Location of Pain Severity and Description of Pain Patient Has Paino No Site Locations Pain Management and Medication Current Pain Management: Electronic Signature(s) Signed: 06/30/2021 4:34:42 PM By: Rhae Hammock RN Entered By: Rhae Hammock on 06/30/2021 15:36:58 -------------------------------------------------------------------------------- Patient/Caregiver Education Details Patient Name: Date of Service: Anita Rice 9/15/2022andnbsp3:15 PM Medical Record Number: [id number] Patient Account Number: [id number] Date of Birth/Gender: Treating RN: 12-04-1955 (65 y.o. [name] Primary Care Physician: Hiram Comber Other Clinician: Referring Physician: Treating Physician/Extender: Ilona Sorrel in Treatment: 9 Education Assessment Education Provided To: Patient Education Topics Provided Wound/Skin Impairment: Methods: Explain/Verbal Responses: State content correctly Electronic Signature(s) Signed: 06/30/2021 4:34:42 PM By: Rhae Hammock RN Entered By: Rhae Hammock on 06/30/2021 15:45:42 -------------------------------------------------------------------------------- Wound Assessment Details Patient Name: Date of Service: Anita Rice Anita L.  06/30/2021 3:15 PM Medical Record Number: [id number] Patient Account Number: [id number] Date of Birth/Sex: Treating RN: 10-08-1956 (65 y.o. Sue Lush Primary Care Latriece Anstine: Hiram Comber Other Clinician: Referring Ruchel Brandenburger: Treating Elecia Serafin/Extender: Harlene Salts Weeks in Treatment: 9 Wound Status Wound Number: 1 Primary Etiology: Trauma, Other Wound Location: Left, Anterior Lower Leg Wound Status: Open Wounding Event: Trauma Comorbid Chronic sinus problems/congestion, Anemia, Asthma, History: Colitis Date Acquired: 02/04/2021 Weeks Of Treatment: 9 Clustered Wound: No Photos Wound Measurements Length: (cm) Width: (cm) Depth: (cm) Area: (cm) Volume: (cm) 0 % Reduction in Area: 100% 0 % Reduction in Volume: 100% 0 Epithelialization: Large (67-100%) 0 0 Wound Description Classification: Full Thickness Without Exposed Support Structures Wound Margin: Distinct, outline attached Exudate Amount: Medium Exudate Type: Serosanguineous Exudate Color: red, brown Foul Odor After Cleansing: No Slough/Fibrino Yes Wound Bed Granulation Amount: Large (67-100%) Exposed Structure Granulation Quality: Pink Fascia Exposed: No Necrotic Amount: Small (1-33%) Fat Layer (Subcutaneous Tissue) Exposed: Yes Necrotic Quality: Adherent Slough Tendon Exposed: No Muscle Exposed: No Joint Exposed: No Bone Exposed: No Electronic Signature(s) Signed: 06/30/2021 5:57:57 PM By: Lorrin Jackson Entered By: Lorrin Jackson on 06/30/2021 15:43:19 -------------------------------------------------------------------------------- Vitals Details Patient Name: Date  of Service: Anita Rice Anita L. 06/30/2021 3:15 PM Medical Record Number: [id number] Patient Account Number: [id number] Date of Birth/Sex: Treating RN: September 22, 1956 (65 y.o. Tonita Phoenix, Lauren Primary Care Phoenix Riesen: Hiram Comber Other Clinician: Referring Vandora Jaskulski: Treating Rise Traeger/Extender: Harlene Salts Weeks in Treatment: 9 Vital Signs Time Taken: 15:43 Temperature (F): 98.7 Height (in): 60 Pulse (bpm): 74 Weight (lbs): 118 Respiratory Rate (breaths/min): 17 Body Mass Index (BMI): 23 Blood Pressure (mmHg): 102/64 Reference Range: 80 - 120 mg / dl Electronic Signature(s) Signed: 06/30/2021 4:34:42 PM By: Rhae Hammock RN Entered By: Rhae Hammock on 06/30/2021 15:43:44

## 2021-09-06 ENCOUNTER — Other Ambulatory Visit: Payer: Self-pay | Admitting: Sports Medicine

## 2021-09-06 DIAGNOSIS — M810 Age-related osteoporosis without current pathological fracture: Secondary | ICD-10-CM | POA: Diagnosis not present

## 2021-09-06 DIAGNOSIS — M4120 Other idiopathic scoliosis, site unspecified: Secondary | ICD-10-CM | POA: Diagnosis not present

## 2021-09-06 DIAGNOSIS — M1612 Unilateral primary osteoarthritis, left hip: Secondary | ICD-10-CM | POA: Diagnosis not present

## 2021-09-06 DIAGNOSIS — E559 Vitamin D deficiency, unspecified: Secondary | ICD-10-CM | POA: Diagnosis not present

## 2021-09-06 DIAGNOSIS — M25512 Pain in left shoulder: Secondary | ICD-10-CM | POA: Diagnosis not present

## 2021-09-06 DIAGNOSIS — R5383 Other fatigue: Secondary | ICD-10-CM | POA: Diagnosis not present

## 2021-09-16 DIAGNOSIS — F902 Attention-deficit hyperactivity disorder, combined type: Secondary | ICD-10-CM | POA: Diagnosis not present

## 2021-09-16 DIAGNOSIS — F419 Anxiety disorder, unspecified: Secondary | ICD-10-CM | POA: Diagnosis not present

## 2021-09-16 DIAGNOSIS — J301 Allergic rhinitis due to pollen: Secondary | ICD-10-CM | POA: Diagnosis not present

## 2021-09-16 DIAGNOSIS — J4530 Mild persistent asthma, uncomplicated: Secondary | ICD-10-CM | POA: Diagnosis not present

## 2021-09-19 DIAGNOSIS — R6889 Other general symptoms and signs: Secondary | ICD-10-CM | POA: Diagnosis not present

## 2021-09-20 DIAGNOSIS — B349 Viral infection, unspecified: Secondary | ICD-10-CM | POA: Diagnosis not present

## 2021-09-20 DIAGNOSIS — Z03818 Encounter for observation for suspected exposure to other biological agents ruled out: Secondary | ICD-10-CM | POA: Diagnosis not present

## 2021-09-22 DIAGNOSIS — J0100 Acute maxillary sinusitis, unspecified: Secondary | ICD-10-CM | POA: Diagnosis not present

## 2021-09-27 DIAGNOSIS — M1612 Unilateral primary osteoarthritis, left hip: Secondary | ICD-10-CM | POA: Diagnosis not present

## 2021-10-13 DIAGNOSIS — R0989 Other specified symptoms and signs involving the circulatory and respiratory systems: Secondary | ICD-10-CM | POA: Diagnosis not present

## 2021-10-13 DIAGNOSIS — J069 Acute upper respiratory infection, unspecified: Secondary | ICD-10-CM | POA: Diagnosis not present

## 2021-10-13 DIAGNOSIS — Z03818 Encounter for observation for suspected exposure to other biological agents ruled out: Secondary | ICD-10-CM | POA: Diagnosis not present

## 2021-10-13 DIAGNOSIS — R059 Cough, unspecified: Secondary | ICD-10-CM | POA: Diagnosis not present

## 2021-10-13 DIAGNOSIS — R051 Acute cough: Secondary | ICD-10-CM | POA: Diagnosis not present

## 2021-10-13 DIAGNOSIS — J3489 Other specified disorders of nose and nasal sinuses: Secondary | ICD-10-CM | POA: Diagnosis not present

## 2021-10-14 ENCOUNTER — Other Ambulatory Visit: Payer: BC Managed Care – PPO

## 2021-10-19 DIAGNOSIS — J029 Acute pharyngitis, unspecified: Secondary | ICD-10-CM | POA: Diagnosis not present

## 2021-10-20 ENCOUNTER — Other Ambulatory Visit: Payer: BC Managed Care – PPO

## 2021-10-21 DIAGNOSIS — H6981 Other specified disorders of Eustachian tube, right ear: Secondary | ICD-10-CM | POA: Diagnosis not present

## 2021-10-21 DIAGNOSIS — U071 COVID-19: Secondary | ICD-10-CM | POA: Diagnosis not present

## 2022-02-02 ENCOUNTER — Other Ambulatory Visit: Payer: BC Managed Care – PPO

## 2022-03-22 DIAGNOSIS — Z124 Encounter for screening for malignant neoplasm of cervix: Secondary | ICD-10-CM | POA: Diagnosis not present

## 2022-03-22 DIAGNOSIS — J4530 Mild persistent asthma, uncomplicated: Secondary | ICD-10-CM | POA: Diagnosis not present

## 2022-03-22 DIAGNOSIS — Z79899 Other long term (current) drug therapy: Secondary | ICD-10-CM | POA: Diagnosis not present

## 2022-03-22 DIAGNOSIS — K5100 Ulcerative (chronic) pancolitis without complications: Secondary | ICD-10-CM | POA: Diagnosis not present

## 2022-03-22 DIAGNOSIS — Z1159 Encounter for screening for other viral diseases: Secondary | ICD-10-CM | POA: Diagnosis not present

## 2022-03-22 DIAGNOSIS — F902 Attention-deficit hyperactivity disorder, combined type: Secondary | ICD-10-CM | POA: Diagnosis not present

## 2022-03-22 DIAGNOSIS — Z23 Encounter for immunization: Secondary | ICD-10-CM | POA: Diagnosis not present

## 2022-03-22 DIAGNOSIS — Z0000 Encounter for general adult medical examination without abnormal findings: Secondary | ICD-10-CM | POA: Diagnosis not present

## 2022-03-22 DIAGNOSIS — Z13220 Encounter for screening for lipoid disorders: Secondary | ICD-10-CM | POA: Diagnosis not present

## 2022-03-22 DIAGNOSIS — F419 Anxiety disorder, unspecified: Secondary | ICD-10-CM | POA: Diagnosis not present

## 2022-07-19 DIAGNOSIS — H00011 Hordeolum externum right upper eyelid: Secondary | ICD-10-CM | POA: Diagnosis not present

## 2022-08-08 ENCOUNTER — Other Ambulatory Visit: Payer: Self-pay | Admitting: Family Medicine

## 2022-08-08 DIAGNOSIS — Z1231 Encounter for screening mammogram for malignant neoplasm of breast: Secondary | ICD-10-CM

## 2022-09-04 DIAGNOSIS — K5120 Ulcerative (chronic) proctitis without complications: Secondary | ICD-10-CM | POA: Diagnosis not present

## 2022-10-02 ENCOUNTER — Ambulatory Visit
Admission: RE | Admit: 2022-10-02 | Discharge: 2022-10-02 | Disposition: A | Discharge status: Home / Self Care | Payer: BC Managed Care – PPO | Source: Ambulatory Visit | Attending: Family Medicine | Admitting: Family Medicine

## 2022-10-02 ENCOUNTER — Other Ambulatory Visit: Payer: Self-pay | Admitting: Sports Medicine

## 2022-10-02 DIAGNOSIS — Z1231 Encounter for screening mammogram for malignant neoplasm of breast: Secondary | ICD-10-CM

## 2022-10-02 DIAGNOSIS — M810 Age-related osteoporosis without current pathological fracture: Secondary | ICD-10-CM

## 2022-10-04 DIAGNOSIS — F419 Anxiety disorder, unspecified: Secondary | ICD-10-CM | POA: Diagnosis not present

## 2022-10-04 DIAGNOSIS — L659 Nonscarring hair loss, unspecified: Secondary | ICD-10-CM | POA: Diagnosis not present

## 2022-10-04 DIAGNOSIS — K5100 Ulcerative (chronic) pancolitis without complications: Secondary | ICD-10-CM | POA: Diagnosis not present

## 2022-10-04 DIAGNOSIS — F902 Attention-deficit hyperactivity disorder, combined type: Secondary | ICD-10-CM | POA: Diagnosis not present

## 2022-10-04 DIAGNOSIS — D509 Iron deficiency anemia, unspecified: Secondary | ICD-10-CM | POA: Diagnosis not present

## 2022-10-04 DIAGNOSIS — J4530 Mild persistent asthma, uncomplicated: Secondary | ICD-10-CM | POA: Diagnosis not present

## 2023-03-27 ENCOUNTER — Ambulatory Visit
Admission: RE | Admit: 2023-03-27 | Discharge: 2023-03-27 | Disposition: A | Discharge status: Home / Self Care | Payer: BC Managed Care – PPO | Source: Ambulatory Visit | Attending: Sports Medicine | Admitting: Sports Medicine

## 2023-03-27 DIAGNOSIS — M810 Age-related osteoporosis without current pathological fracture: Secondary | ICD-10-CM

## 2023-03-27 DIAGNOSIS — E349 Endocrine disorder, unspecified: Secondary | ICD-10-CM | POA: Diagnosis not present

## 2023-03-27 DIAGNOSIS — N951 Menopausal and female climacteric states: Secondary | ICD-10-CM | POA: Diagnosis not present

## 2023-03-27 DIAGNOSIS — R636 Underweight: Secondary | ICD-10-CM | POA: Diagnosis not present

## 2023-05-02 DIAGNOSIS — M810 Age-related osteoporosis without current pathological fracture: Secondary | ICD-10-CM | POA: Diagnosis not present

## 2023-05-02 DIAGNOSIS — Z13220 Encounter for screening for lipoid disorders: Secondary | ICD-10-CM | POA: Diagnosis not present

## 2023-05-02 DIAGNOSIS — D509 Iron deficiency anemia, unspecified: Secondary | ICD-10-CM | POA: Diagnosis not present

## 2023-05-02 DIAGNOSIS — N952 Postmenopausal atrophic vaginitis: Secondary | ICD-10-CM | POA: Diagnosis not present

## 2023-05-02 DIAGNOSIS — F419 Anxiety disorder, unspecified: Secondary | ICD-10-CM | POA: Diagnosis not present

## 2023-05-02 DIAGNOSIS — Z0000 Encounter for general adult medical examination without abnormal findings: Secondary | ICD-10-CM | POA: Diagnosis not present

## 2023-05-02 DIAGNOSIS — J4530 Mild persistent asthma, uncomplicated: Secondary | ICD-10-CM | POA: Diagnosis not present

## 2023-05-02 DIAGNOSIS — F902 Attention-deficit hyperactivity disorder, combined type: Secondary | ICD-10-CM | POA: Diagnosis not present

## 2023-08-17 ENCOUNTER — Other Ambulatory Visit (HOSPITAL_BASED_OUTPATIENT_CLINIC_OR_DEPARTMENT_OTHER): Payer: Self-pay

## 2023-08-17 MED ORDER — AMPHETAMINE-DEXTROAMPHET ER 30 MG PO CP24
30.0000 mg | ORAL_CAPSULE | Freq: Every morning | ORAL | 0 refills | Status: AC
  Filled 2023-09-27: qty 30, 30d supply, fill #0

## 2023-08-17 MED ORDER — AMPHETAMINE-DEXTROAMPHET ER 30 MG PO CP24
30.0000 mg | ORAL_CAPSULE | Freq: Every morning | ORAL | 0 refills | Status: AC
  Filled 2023-08-17: qty 30, 30d supply, fill #0

## 2023-08-17 MED ORDER — AMPHETAMINE-DEXTROAMPHET ER 30 MG PO CP24
30.0000 mg | ORAL_CAPSULE | Freq: Every morning | ORAL | 0 refills | Status: AC
  Filled 2023-12-28: qty 30, 30d supply, fill #0

## 2023-08-17 MED ORDER — AMPHETAMINE-DEXTROAMPHETAMINE 10 MG PO TABS
10.0000 mg | ORAL_TABLET | Freq: Every evening | ORAL | 0 refills | Status: DC
  Filled 2023-08-17: qty 30, 30d supply, fill #0

## 2023-08-20 ENCOUNTER — Other Ambulatory Visit (HOSPITAL_BASED_OUTPATIENT_CLINIC_OR_DEPARTMENT_OTHER): Payer: Self-pay

## 2023-08-21 ENCOUNTER — Other Ambulatory Visit (HOSPITAL_BASED_OUTPATIENT_CLINIC_OR_DEPARTMENT_OTHER): Payer: Self-pay

## 2023-09-21 ENCOUNTER — Other Ambulatory Visit (HOSPITAL_BASED_OUTPATIENT_CLINIC_OR_DEPARTMENT_OTHER): Payer: Self-pay

## 2023-09-21 MED ORDER — AMPHETAMINE-DEXTROAMPHETAMINE 10 MG PO TABS
10.0000 mg | ORAL_TABLET | Freq: Every day | ORAL | 0 refills | Status: DC
  Filled 2023-09-21: qty 20, 20d supply, fill #0
  Filled 2023-09-21: qty 10, 10d supply, fill #0

## 2023-09-27 ENCOUNTER — Other Ambulatory Visit (HOSPITAL_COMMUNITY): Payer: Self-pay

## 2023-09-27 ENCOUNTER — Other Ambulatory Visit (HOSPITAL_BASED_OUTPATIENT_CLINIC_OR_DEPARTMENT_OTHER): Payer: Self-pay

## 2023-10-11 ENCOUNTER — Ambulatory Visit: Payer: BC Managed Care – PPO

## 2023-10-11 ENCOUNTER — Other Ambulatory Visit: Payer: Self-pay | Admitting: Family Medicine

## 2023-10-11 DIAGNOSIS — Z0000 Encounter for general adult medical examination without abnormal findings: Secondary | ICD-10-CM

## 2023-10-15 ENCOUNTER — Other Ambulatory Visit (HOSPITAL_BASED_OUTPATIENT_CLINIC_OR_DEPARTMENT_OTHER): Payer: Self-pay

## 2023-10-15 MED ORDER — ALBUTEROL SULFATE HFA 108 (90 BASE) MCG/ACT IN AERS
2.0000 | INHALATION_SPRAY | RESPIRATORY_TRACT | 1 refills | Status: AC | PRN
  Filled 2023-10-15: qty 6.7, 17d supply, fill #0

## 2023-10-15 MED ORDER — FLUTICASONE PROPIONATE 50 MCG/ACT NA SUSP
2.0000 | Freq: Every day | NASAL | 1 refills | Status: AC
  Filled 2023-10-15: qty 16, 30d supply, fill #0

## 2023-10-16 ENCOUNTER — Other Ambulatory Visit (HOSPITAL_BASED_OUTPATIENT_CLINIC_OR_DEPARTMENT_OTHER): Payer: Self-pay

## 2023-10-16 MED ORDER — ESCITALOPRAM OXALATE 10 MG PO TABS
15.0000 mg | ORAL_TABLET | Freq: Every day | ORAL | 0 refills | Status: AC
  Filled 2023-10-16: qty 45, 30d supply, fill #0
  Filled 2024-04-15: qty 45, 30d supply, fill #1
  Filled 2024-05-22: qty 45, 30d supply, fill #2

## 2023-10-30 ENCOUNTER — Ambulatory Visit
Admission: RE | Admit: 2023-10-30 | Discharge: 2023-10-30 | Disposition: A | Discharge status: Home / Self Care | Payer: BC Managed Care – PPO | Source: Ambulatory Visit | Attending: Family Medicine | Admitting: Family Medicine

## 2023-10-30 DIAGNOSIS — Z0000 Encounter for general adult medical examination without abnormal findings: Secondary | ICD-10-CM

## 2023-10-30 DIAGNOSIS — Z1231 Encounter for screening mammogram for malignant neoplasm of breast: Secondary | ICD-10-CM | POA: Diagnosis not present

## 2023-11-02 ENCOUNTER — Other Ambulatory Visit (HOSPITAL_BASED_OUTPATIENT_CLINIC_OR_DEPARTMENT_OTHER): Payer: Self-pay

## 2023-11-02 DIAGNOSIS — J301 Allergic rhinitis due to pollen: Secondary | ICD-10-CM | POA: Diagnosis not present

## 2023-11-02 DIAGNOSIS — F902 Attention-deficit hyperactivity disorder, combined type: Secondary | ICD-10-CM | POA: Diagnosis not present

## 2023-11-02 DIAGNOSIS — F419 Anxiety disorder, unspecified: Secondary | ICD-10-CM | POA: Diagnosis not present

## 2023-11-02 DIAGNOSIS — J4530 Mild persistent asthma, uncomplicated: Secondary | ICD-10-CM | POA: Diagnosis not present

## 2023-11-02 MED ORDER — FEXOFENADINE HCL 180 MG PO TABS
180.0000 mg | ORAL_TABLET | Freq: Every day | ORAL | 1 refills | Status: DC
  Filled 2024-01-16: qty 30, 30d supply, fill #0
  Filled 2024-03-25: qty 30, 30d supply, fill #1
  Filled 2024-05-22: qty 30, 30d supply, fill #2
  Filled 2024-06-13: qty 30, 30d supply, fill #3
  Filled 2024-07-23: qty 30, 30d supply, fill #4
  Filled 2024-10-15: qty 30, 30d supply, fill #5

## 2023-11-02 MED ORDER — AMPHETAMINE-DEXTROAMPHETAMINE 10 MG PO TABS: 10.0000 mg | ORAL_TABLET | Freq: Every day | ORAL | 0 refills | Status: AC

## 2023-11-02 MED ORDER — AMPHETAMINE-DEXTROAMPHET ER 30 MG PO CP24: ORAL_CAPSULE | ORAL | 0 refills | Status: AC

## 2023-11-02 MED ORDER — ALBUTEROL SULFATE HFA 108 (90 BASE) MCG/ACT IN AERS
2.0000 | INHALATION_SPRAY | RESPIRATORY_TRACT | 1 refills | Status: AC | PRN
  Filled 2023-11-02: qty 6.7, 17d supply, fill #0
  Filled 2024-09-30: qty 6.7, 17d supply, fill #1

## 2023-11-02 MED ORDER — BUDESONIDE-FORMOTEROL FUMARATE 160-4.5 MCG/ACT IN AERO
2.0000 | INHALATION_SPRAY | Freq: Every day | RESPIRATORY_TRACT | 1 refills | Status: DC
  Filled 2023-11-02: qty 10.2, 30d supply, fill #0
  Filled 2024-09-30: qty 10.2, 30d supply, fill #1

## 2023-11-02 MED ORDER — ESTRADIOL 10 MCG VA TABS
10.0000 ug | ORAL_TABLET | VAGINAL | 1 refills | Status: AC
  Filled 2023-11-02 – 2024-07-23 (×2): qty 8, 28d supply, fill #0

## 2023-11-02 MED ORDER — AMPHETAMINE-DEXTROAMPHET ER 30 MG PO CP24
30.0000 mg | ORAL_CAPSULE | Freq: Every morning | ORAL | 0 refills | Status: AC
  Filled 2023-11-02: qty 30, 30d supply, fill #0

## 2023-11-02 MED ORDER — ESCITALOPRAM OXALATE 10 MG PO TABS
15.0000 mg | ORAL_TABLET | Freq: Every day | ORAL | 0 refills | Status: AC
  Filled 2023-11-02: qty 135, 90d supply, fill #0

## 2023-11-02 MED ORDER — FLUTICASONE PROPIONATE 50 MCG/ACT NA SUSP
2.0000 | Freq: Every day | NASAL | 1 refills | Status: DC
  Filled 2023-11-02 – 2024-09-30 (×2): qty 16, 30d supply, fill #0

## 2023-11-09 ENCOUNTER — Other Ambulatory Visit (HOSPITAL_BASED_OUTPATIENT_CLINIC_OR_DEPARTMENT_OTHER): Payer: Self-pay

## 2023-11-23 DIAGNOSIS — E559 Vitamin D deficiency, unspecified: Secondary | ICD-10-CM | POA: Diagnosis not present

## 2023-11-23 DIAGNOSIS — R5383 Other fatigue: Secondary | ICD-10-CM | POA: Diagnosis not present

## 2023-11-28 DIAGNOSIS — R0981 Nasal congestion: Secondary | ICD-10-CM | POA: Diagnosis not present

## 2023-11-28 DIAGNOSIS — R051 Acute cough: Secondary | ICD-10-CM | POA: Diagnosis not present

## 2023-11-28 DIAGNOSIS — R5383 Other fatigue: Secondary | ICD-10-CM | POA: Diagnosis not present

## 2023-12-15 ENCOUNTER — Emergency Department (HOSPITAL_BASED_OUTPATIENT_CLINIC_OR_DEPARTMENT_OTHER): Admitting: Radiology

## 2023-12-15 ENCOUNTER — Emergency Department (HOSPITAL_BASED_OUTPATIENT_CLINIC_OR_DEPARTMENT_OTHER)
Admission: EM | Admit: 2023-12-15 | Discharge: 2023-12-15 | Disposition: A | Discharge status: Home / Self Care | Attending: Emergency Medicine | Admitting: Emergency Medicine

## 2023-12-15 ENCOUNTER — Other Ambulatory Visit: Payer: Self-pay

## 2023-12-15 DIAGNOSIS — S81812A Laceration without foreign body, left lower leg, initial encounter: Secondary | ICD-10-CM | POA: Diagnosis not present

## 2023-12-15 DIAGNOSIS — Z23 Encounter for immunization: Secondary | ICD-10-CM | POA: Diagnosis not present

## 2023-12-15 DIAGNOSIS — J45909 Unspecified asthma, uncomplicated: Secondary | ICD-10-CM | POA: Diagnosis not present

## 2023-12-15 DIAGNOSIS — S92901A Unspecified fracture of right foot, initial encounter for closed fracture: Secondary | ICD-10-CM | POA: Diagnosis not present

## 2023-12-15 DIAGNOSIS — S99911A Unspecified injury of right ankle, initial encounter: Secondary | ICD-10-CM | POA: Diagnosis not present

## 2023-12-15 DIAGNOSIS — W108XXA Fall (on) (from) other stairs and steps, initial encounter: Secondary | ICD-10-CM | POA: Insufficient documentation

## 2023-12-15 DIAGNOSIS — S81811A Laceration without foreign body, right lower leg, initial encounter: Secondary | ICD-10-CM | POA: Diagnosis not present

## 2023-12-15 DIAGNOSIS — W19XXXA Unspecified fall, initial encounter: Secondary | ICD-10-CM

## 2023-12-15 DIAGNOSIS — S99921A Unspecified injury of right foot, initial encounter: Secondary | ICD-10-CM | POA: Diagnosis not present

## 2023-12-15 DIAGNOSIS — S8992XA Unspecified injury of left lower leg, initial encounter: Secondary | ICD-10-CM | POA: Diagnosis not present

## 2023-12-15 DIAGNOSIS — S92254A Nondisplaced fracture of navicular [scaphoid] of right foot, initial encounter for closed fracture: Secondary | ICD-10-CM | POA: Diagnosis not present

## 2023-12-15 MED ORDER — LIDOCAINE-EPINEPHRINE (PF) 2 %-1:200000 IJ SOLN
20.0000 mL | Freq: Once | INTRAMUSCULAR | Status: AC
  Administered 2023-12-15: 20 mL via INTRADERMAL
  Filled 2023-12-15: qty 20

## 2023-12-15 MED ORDER — OXYCODONE HCL 5 MG PO TABS: 5.0000 mg | ORAL_TABLET | ORAL | 0 refills | Status: DC | PRN

## 2023-12-15 MED ORDER — TETANUS-DIPHTH-ACELL PERTUSSIS 5-2.5-18.5 LF-MCG/0.5 IM SUSY
0.5000 mL | PREFILLED_SYRINGE | Freq: Once | INTRAMUSCULAR | Status: AC
  Administered 2023-12-15: 0.5 mL via INTRAMUSCULAR
  Filled 2023-12-15: qty 0.5

## 2023-12-15 MED ORDER — OXYCODONE HCL 5 MG PO TABS
5.0000 mg | ORAL_TABLET | Freq: Once | ORAL | Status: AC
  Administered 2023-12-15: 5 mg via ORAL
  Filled 2023-12-15: qty 1

## 2023-12-15 MED ORDER — OXYCODONE HCL 5 MG PO TABS: 5.0000 mg | ORAL_TABLET | ORAL | 0 refills | Status: AC | PRN

## 2023-12-15 NOTE — Discharge Instructions (Addendum)
 It was a pleasure caring for you today.  As discussed, please follow-up with your orthopedic provider first thing Monday morning.  Please also follow-up with your primary care provider.  Seek emergency care if experiencing any new or worsening symptoms.  Non-asorbable sutures: Keep sutures dry. Please refrain from swimming or bathing. Area can be gently cleaned with mild soap and water after 24 hours. Please return to local urgent care or primary care in  8 to 10 days for suture removal. Failure to remove sutures in timely manner can result in infection.   Alternating between 650 mg Tylenol and 400 mg Advil: The best way to alternate taking Acetaminophen (example Tylenol) and Ibuprofen (example Advil/Motrin) is to take them 3 hours apart. For example, if you take ibuprofen at 6 am you can then take Tylenol at 9 am. You can continue this regimen throughout the day, making sure you do not exceed the recommended maximum dose for each drug.

## 2023-12-15 NOTE — ED Triage Notes (Signed)
 Pt is coming in for a fall that occurred when she was walking downs ome steps with a box, she missed one of the steps at the bottom causing her to fall forward. She is not on blood thinners. She has no complaints of a head injury, No headache. Her only complaint is of a lower left leg injury that has some significant swelling/bruising/bleeding. She has full movement in her foot, with no loss of sensation. There is a significant laceration/skin avulsion of the left skin with a covering placed prior to arrival.

## 2023-12-15 NOTE — ED Provider Notes (Signed)
 [place] EMERGENCY DEPARTMENT AT [facility] Provider Note   CSN: [id number] Arrival date & time: 12/15/23  1943     History  Chief Complaint  Patient presents with   Marletta Lor    Anita Rice is a 68 y.o. female with PMHx anemia, anxiety, asthma, HLD, rectal ulcerative colitis who presents to ED after mechanical fall. Patient was on the last step going downstairs when she tripped and fell forward. Denies head trauma, LOC, blood thinners, seizures. Patient concerned for right foot pain and laceration of the left lower leg. Denies any recent infectious symptoms. Patient unsure of last tetanus dose.    Fall       Home Medications Prior to Admission medications   Medication Sig Start Date End Date Taking? Authorizing Provider  oxyCODONE (ROXICODONE) 5 MG immediate release tablet Take 1 tablet (5 mg total) by mouth every 4 (four) hours as needed for up to 5 doses for severe pain (pain score 7-10). 12/15/23  Yes Valrie Hart F, PA-C  acetaminophen (TYLENOL) 500 MG tablet Take 500 mg by mouth every 6 (six) hours as needed for mild pain.     [provider]  Adalimumab (HUMIRA PEN Sigurd) Inject into the skin every 21 ( twenty-one) days.    [provider]  albuterol (VENTOLIN HFA) 108 (90 Base) MCG/ACT inhaler Inhale 2 puffs into the lungs every 4 (four) hours as needed. 10/15/23     albuterol (VENTOLIN HFA) 108 (90 Base) MCG/ACT inhaler Inhale 2 puffs into the lungs every 4 (four) hours as needed. 11/02/23     Albuterol Sulfate (PROAIR RESPICLICK) 108 (90 Base) MCG/ACT AEPB Inhale 1 puff into the lungs as needed. 07/10/19   Noni Saupe, MD  amphetamine-dextroamphetamine (ADDERALL XR) 30 MG 24 hr capsule Take 1 capsule (30 mg total) by mouth daily. 12/11/19   Noni Saupe, MD  amphetamine-dextroamphetamine (ADDERALL XR) 30 MG 24 hr capsule Take 1 capsule (30 mg total) by mouth daily. 03/25/20   Noni Saupe, MD   amphetamine-dextroamphetamine (ADDERALL XR) 30 MG 24 hr capsule Take 1 capsule (30 mg total) by mouth daily. 03/25/20   Noni Saupe, MD  amphetamine-dextroamphetamine (ADDERALL XR) 30 MG 24 hr capsule Take 1 capsule (30 mg total) by mouth every morning. 08/17/23     amphetamine-dextroamphetamine (ADDERALL XR) 30 MG 24 hr capsule Take 1 capsule (30 mg total) by mouth every morning. 08/17/23     amphetamine-dextroamphetamine (ADDERALL XR) 30 MG 24 hr capsule Take 1 capsule (30 mg total) by mouth every morning. 08/17/23     amphetamine-dextroamphetamine (ADDERALL XR) 30 MG 24 hr capsule Take 1 capsule (30 mg total) by mouth every morning. 11/02/23     amphetamine-dextroamphetamine (ADDERALL XR) 30 MG 24 hr capsule 1 capsule in the morning Orally... may fill 30 days after last refill Once a day 11/02/23     amphetamine-dextroamphetamine (ADDERALL XR) 30 MG 24 hr capsule 1 capsule in the morning Orally... please fill generic 30 days since last refill Once a day 11/02/23     amphetamine-dextroamphetamine (ADDERALL) 10 MG tablet Take 1 tablet (10 mg total) by mouth daily as needed. 12/11/19   Noni Saupe, MD  amphetamine-dextroamphetamine (ADDERALL) 10 MG tablet Take 1 tablet (10 mg total) by mouth daily as needed. 03/25/20   Noni Saupe, MD  amphetamine-dextroamphetamine (ADDERALL) 10 MG tablet Take 1 tablet (10 mg total) by mouth daily as needed. 03/25/20   Lezlie Lye, Darcel Bayley  M, MD  amphetamine-dextroamphetamine (ADDERALL) 10 MG tablet Take 1 tablet (10 mg total) by mouth daily in the afternoon. 11/02/23     budesonide-formoterol (SYMBICORT) 160-4.5 MCG/ACT inhaler Inhale 2 puffs into the lungs daily. 11/02/23     Calcium Carb-Cholecalciferol (CALCIUM 500 + D3 PO) Take by mouth daily.    [provider]  escitalopram (LEXAPRO) 10 MG tablet TAKE 1 TABLET BY MOUTH EVERY DAY 07/05/20   Lezlie Lye, Meda Coffee, MD  escitalopram (LEXAPRO) 10 MG tablet Take 1.5 tablets (15 mg total) by  mouth daily. 10/16/23     escitalopram (LEXAPRO) 10 MG tablet Take 1.5 tablets (15 mg total) by mouth daily. 11/02/23     Estradiol (YUVAFEM) 10 MCG TABS vaginal tablet PLACE 1 TABLET (10 MCG TOTAL) VAGINALLY 2 (TWO) TIMES A WEEK. 07/10/19   Lezlie Lye, Meda Coffee, MD  Estradiol 10 MCG TABS vaginal tablet Place 1 tablet (10 mcg total) vaginally 2 (two) times a week. 11/05/23     Ferrous Sulfate (IRON) 28 MG TABS Take 1 tablet by mouth every morning.     [provider]  fexofenadine (ALLEGRA) 180 MG tablet Take 1 tablet (180 mg total) by mouth daily. 12/11/19   Noni Saupe, MD  fexofenadine (ALLERGY RELIEF) 180 MG tablet Take 1 tablet (180 mg total) by mouth daily. 11/02/23     fluocinolone (VANOS) 0.01 % cream APPLY TO AFFECTED AREA TWICE A DAY 06/20/20   Lezlie Lye, Meda Coffee, MD  fluticasone [facility]) 50 MCG/ACT nasal spray PLACE 2 SPRAYS IN THE NOSE ONCE DAILY AS DIRECTED 08/05/19   Lezlie Lye, Meda Coffee, MD  fluticasone [facility]) 50 MCG/ACT nasal spray Place 2 sprays into both nostrils daily. 10/15/23     fluticasone (FLONASE) 50 MCG/ACT nasal spray Place 2 sprays into both nostrils daily. 11/02/23     Fluticasone-Salmeterol (ADVAIR DISKUS) 250-50 MCG/DOSE AEPB Inhale 1 puff into the lungs 2 (two) times daily. 07/10/19   Noni Saupe, MD  Multiple Vitamins-Minerals (CENTRUM SILVER PO) Take 1 tablet by mouth every morning.     [provider]  raloxifene (EVISTA) 60 MG tablet Take 60 mg by mouth daily. 11/23/19   [provider]  Vitamin D, Ergocalciferol, (DRISDOL) 1.25 MG (50000 UNIT) CAPS capsule Take 50,000 Units by mouth 2 (two) times a week. 11/17/19   [provider]      Allergies    Adderall xr [amphetamine-dextroamphet er], Benadryl [diphenhydramine], and Neosporin [neomycin-bacitracin zn-polymyx]    Review of Systems   Review of Systems  Musculoskeletal:        Foot pain    Physical Exam Updated Vital Signs BP 106/69   Pulse 80    Temp 98.1 F (36.7 C)   Resp 16   SpO2 100%  Physical Exam Vitals and nursing note reviewed.  Constitutional:      General: She is not in acute distress.    Appearance: She is not ill-appearing or toxic-appearing.  HENT:     Head: Normocephalic and atraumatic.     Mouth/Throat:     Mouth: Mucous membranes are moist.  Eyes:     General: No scleral icterus.       Right eye: No discharge.        Left eye: No discharge.     Conjunctiva/sclera: Conjunctivae normal.  Cardiovascular:     Rate and Rhythm: Normal rate and regular rhythm.     Pulses: Normal pulses.     Heart sounds: Normal heart  sounds. No murmur heard. Pulmonary:     Effort: Pulmonary effort is normal.  Musculoskeletal:     Right lower leg: No edema.     Left lower leg: No edema.       Legs:     Comments: Non-linear 6cm laceration of anterior left lower leg. No surrounding erythema or purulence/foreign bodies appreciated. Bleeding controlled with pressure.  Tenderness to palpation of dorsal navicular area of right foot.  +2 pedal pulses BL. Brisk capillary refill. Right foot ROM intact. BL legs non-tense. Sensation to light touch intact.  Skin:    General: Skin is warm and dry.     Findings: No rash.  Neurological:     General: No focal deficit present.     Mental Status: She is alert and oriented to person, place, and time. Mental status is at baseline.  Psychiatric:        Mood and Affect: Mood normal.     ED Results / Procedures / Treatments   Labs (all labs ordered are listed, but only abnormal results are displayed) Labs Reviewed - No data to display  EKG None  Radiology DG Ankle Complete Right Result Date: 12/15/2023 CLINICAL DATA:  fall with lower leg injury EXAM: RIGHT ANKLE - COMPLETE 3+ VIEW COMPARISON:  None Available. FINDINGS: Cortical irregularity of the superior dorsal aspect of the navicular. No dislocation. There is no evidence of arthropathy or other focal bone abnormality. Soft tissues  are unremarkable. IMPRESSION: Cortical irregularity of the superior dorsal aspect of the navicular. Correlate with point tenderness to palpation to evaluate for an acute fracture. Electronically Signed   By: Tish Frederickson M.D.   On: 12/15/2023 20:54   DG Tibia/Fibula Left Result Date: 12/15/2023 CLINICAL DATA:  fall with lower leg injury EXAM: LEFT TIBIA AND FIBULA - 2 VIEW COMPARISON:  None Available. FINDINGS: There is no evidence of fracture or other focal bone lesions. Tiny dermal calcifications. IMPRESSION: No acute displaced fracture or dislocation. Electronically Signed   By: Tish Frederickson M.D.   On: 12/15/2023 20:53    Procedures .Laceration Repair  Date/Time: 12/15/2023 10:56 PM  Performed by: Dorthy Cooler, PA-C Authorized by: Dorthy Cooler, PA-C   Consent:    Consent obtained:  Verbal   Consent given by:  Patient   Risks, benefits, and alternatives were discussed: yes     Risks discussed:  Infection, need for additional repair, nerve damage, poor wound healing, pain, poor cosmetic result, retained foreign body, tendon damage and vascular damage   Alternatives discussed:  No treatment Universal protocol:    Procedure explained and questions answered to patient or proxy's satisfaction: yes     Patient identity confirmed:  Verbally with patient Anesthesia:    Anesthesia method:  Local infiltration   Local anesthetic:  Lidocaine 2% WITH epi Laceration details:    Location:  Leg   Leg location:  L lower leg   Length (cm):  6 Pre-procedure details:    Preparation:  Patient was prepped and draped in usual sterile fashion Exploration:    Imaging obtained: x-ray     Imaging outcome: foreign body not noted     Wound exploration: wound explored through full range of motion and entire depth of wound visualized     Contaminated: no   Treatment:    Area cleansed with:  Povidone-iodine and saline   Amount of cleaning:  Standard   Irrigation solution:  Sterile  saline   Irrigation volume:    Irrigation  method:  Pressure wash Skin repair:    Repair method:  Sutures   Suture size:  4-0   Suture material:  Prolene   Suture technique:  Simple interrupted and horizontal mattress   Number of sutures:  11 Approximation:    Approximation:  Close Repair type:    Repair type:  Intermediate Post-procedure details:    Dressing:  Antibiotic ointment and non-adherent dressing   Procedure completion:  Tolerated well, no immediate complications Comments:     2 horizontal sutures and 10 simple interrupted sutures     Medications Ordered in ED Medications  lidocaine-EPINEPHrine (XYLOCAINE W/EPI) 2 %-1:200000 (PF) injection 20 mL (20 mLs Intradermal Given by Other 12/15/23 2134)  oxyCODONE (Oxy IR/ROXICODONE) immediate release tablet 5 mg (5 mg Oral Given 12/15/23 2317)  Tdap (BOOSTRIX) injection 0.5 mL (0.5 mLs Intramuscular Given 12/15/23 2333)    ED Course/ Medical Decision Making/ A&P                                 Medical Decision Making Amount and/or Complexity of Data Reviewed Radiology: ordered.  Risk Prescription drug management.   This patient presents to the ED for concern of a  6cm non-linear laceration to their left lower leg, this involves an extensive number of treatment options, and is a complaint that carries with it a high risk of complications and morbidity.  The differential diagnosis includes foreign body, fracture, NV compromise. These are considered less likely due to history of present illness and physical exam findings.   Co morbidities that complicate the patient evaluation  anemia, anxiety, asthma, HLD, rectal ulcerative colitis   Additional history obtained:  Dr. Donalee Citrin PCP   Imaging Studies ordered:  Triage provider ordered imaging studies including right foot/left lower leg xray  I independently visualized and interpreted imaging which showed possible navicular fracture of right foot. No other acute  process I agree with the radiologist interpretation    Problem List / ED Course / Critical interventions / Medication management  Patient presented for a 6cm non-linear laceration to their left lower leg. They are neurovascularly intact. Tetanus is provided in ED today. Patient is in no distress. Laceration will be repaired with standard wound care procedures and antibiotic ointment. The laceration is not through infected skin, associated with deep puncture wounds, >8hrs old, or grossly contaminated with foreign debris, so I will be using sutures for primary closure. Patient/family educated about specific return precautions for given chief complaint and symptoms. Laceration repaired without complication and with pulse, motor, sensation intact.  Right foot xray showing possible navicular fracture. There is point tenderness of this area on physical exam. Rest of physical exam reassuring. Both legs NV intact and non-tense. Placed patient in posterior ankle splint - area still NV intact. Patient stating that she is already established with an outpatient orthopedic provider and agrees to call them first thing Monday morning. Patient also understands to follow up with PCP in 8-10 days for laceration check and suture removal.  All education provided in verbal form with additional information in written form. Time was allowed for answering of patient questions.  Staffed with Dr. Eloise Harman I have reviewed the patients home medicines and have made adjustments as needed Patient was given return precautions. Patient stable for discharge at this time.  Patient verbalized understanding of plan.   Social Determinants of Health:  geriatric         Final Clinical  Impression(s) / ED Diagnoses Final diagnoses:  Closed nondisplaced fracture of navicular bone of right foot, initial encounter  Laceration of left lower leg, initial encounter  Fall, initial encounter    Rx / DC Orders ED Discharge Orders           Ordered    oxyCODONE (ROXICODONE) 5 MG immediate release tablet  Every 4 hours PRN,   Status:  Discontinued        12/15/23 2304    oxyCODONE (ROXICODONE) 5 MG immediate release tablet  Every 4 hours PRN        12/15/23 2322              Dorthy Cooler, [place] 12/15/23 2336    Rondel Baton, MD 12/17/23 1140

## 2023-12-17 DIAGNOSIS — M25571 Pain in right ankle and joints of right foot: Secondary | ICD-10-CM | POA: Diagnosis not present

## 2023-12-25 ENCOUNTER — Encounter (HOSPITAL_BASED_OUTPATIENT_CLINIC_OR_DEPARTMENT_OTHER): Attending: General Surgery | Admitting: General Surgery

## 2023-12-25 DIAGNOSIS — Z4802 Encounter for removal of sutures: Secondary | ICD-10-CM | POA: Diagnosis not present

## 2023-12-25 DIAGNOSIS — L97822 Non-pressure chronic ulcer of other part of left lower leg with fat layer exposed: Secondary | ICD-10-CM | POA: Diagnosis not present

## 2023-12-25 DIAGNOSIS — G629 Polyneuropathy, unspecified: Secondary | ICD-10-CM | POA: Diagnosis not present

## 2023-12-25 DIAGNOSIS — S81802A Unspecified open wound, left lower leg, initial encounter: Secondary | ICD-10-CM | POA: Diagnosis not present

## 2023-12-25 DIAGNOSIS — S81812A Laceration without foreign body, left lower leg, initial encounter: Secondary | ICD-10-CM | POA: Diagnosis not present

## 2023-12-28 ENCOUNTER — Other Ambulatory Visit (HOSPITAL_BASED_OUTPATIENT_CLINIC_OR_DEPARTMENT_OTHER): Payer: Self-pay

## 2024-01-01 ENCOUNTER — Encounter (HOSPITAL_BASED_OUTPATIENT_CLINIC_OR_DEPARTMENT_OTHER): Admitting: General Surgery

## 2024-01-01 DIAGNOSIS — Z4802 Encounter for removal of sutures: Secondary | ICD-10-CM | POA: Diagnosis not present

## 2024-01-01 DIAGNOSIS — S81802A Unspecified open wound, left lower leg, initial encounter: Secondary | ICD-10-CM | POA: Diagnosis not present

## 2024-01-01 DIAGNOSIS — G629 Polyneuropathy, unspecified: Secondary | ICD-10-CM | POA: Diagnosis not present

## 2024-01-01 DIAGNOSIS — L97822 Non-pressure chronic ulcer of other part of left lower leg with fat layer exposed: Secondary | ICD-10-CM | POA: Diagnosis not present

## 2024-01-02 ENCOUNTER — Encounter (HOSPITAL_BASED_OUTPATIENT_CLINIC_OR_DEPARTMENT_OTHER): Admitting: General Surgery

## 2024-01-02 DIAGNOSIS — G629 Polyneuropathy, unspecified: Secondary | ICD-10-CM | POA: Diagnosis not present

## 2024-01-02 DIAGNOSIS — Z4802 Encounter for removal of sutures: Secondary | ICD-10-CM | POA: Diagnosis not present

## 2024-01-02 DIAGNOSIS — L97822 Non-pressure chronic ulcer of other part of left lower leg with fat layer exposed: Secondary | ICD-10-CM | POA: Diagnosis not present

## 2024-01-08 ENCOUNTER — Encounter (HOSPITAL_BASED_OUTPATIENT_CLINIC_OR_DEPARTMENT_OTHER): Admitting: General Surgery

## 2024-01-08 DIAGNOSIS — G629 Polyneuropathy, unspecified: Secondary | ICD-10-CM | POA: Diagnosis not present

## 2024-01-08 DIAGNOSIS — S81802A Unspecified open wound, left lower leg, initial encounter: Secondary | ICD-10-CM | POA: Diagnosis not present

## 2024-01-08 DIAGNOSIS — L97822 Non-pressure chronic ulcer of other part of left lower leg with fat layer exposed: Secondary | ICD-10-CM | POA: Diagnosis not present

## 2024-01-08 DIAGNOSIS — S80812A Abrasion, left lower leg, initial encounter: Secondary | ICD-10-CM | POA: Diagnosis not present

## 2024-01-08 DIAGNOSIS — Z4802 Encounter for removal of sutures: Secondary | ICD-10-CM | POA: Diagnosis not present

## 2024-01-08 DIAGNOSIS — S81812A Laceration without foreign body, left lower leg, initial encounter: Secondary | ICD-10-CM | POA: Diagnosis not present

## 2024-01-14 DIAGNOSIS — S81829A Laceration with foreign body, unspecified lower leg, initial encounter: Secondary | ICD-10-CM | POA: Diagnosis not present

## 2024-01-15 ENCOUNTER — Encounter (HOSPITAL_BASED_OUTPATIENT_CLINIC_OR_DEPARTMENT_OTHER): Attending: General Surgery | Admitting: General Surgery

## 2024-01-15 DIAGNOSIS — L97822 Non-pressure chronic ulcer of other part of left lower leg with fat layer exposed: Secondary | ICD-10-CM | POA: Diagnosis not present

## 2024-01-15 DIAGNOSIS — S80812A Abrasion, left lower leg, initial encounter: Secondary | ICD-10-CM | POA: Diagnosis not present

## 2024-01-15 DIAGNOSIS — S81829A Laceration with foreign body, unspecified lower leg, initial encounter: Secondary | ICD-10-CM | POA: Diagnosis not present

## 2024-01-15 DIAGNOSIS — G629 Polyneuropathy, unspecified: Secondary | ICD-10-CM | POA: Insufficient documentation

## 2024-01-15 DIAGNOSIS — S81812A Laceration without foreign body, left lower leg, initial encounter: Secondary | ICD-10-CM | POA: Diagnosis not present

## 2024-01-16 ENCOUNTER — Other Ambulatory Visit (HOSPITAL_BASED_OUTPATIENT_CLINIC_OR_DEPARTMENT_OTHER): Payer: Self-pay

## 2024-01-16 DIAGNOSIS — S81829A Laceration with foreign body, unspecified lower leg, initial encounter: Secondary | ICD-10-CM | POA: Diagnosis not present

## 2024-01-17 DIAGNOSIS — S81829A Laceration with foreign body, unspecified lower leg, initial encounter: Secondary | ICD-10-CM | POA: Diagnosis not present

## 2024-01-18 DIAGNOSIS — S81829A Laceration with foreign body, unspecified lower leg, initial encounter: Secondary | ICD-10-CM | POA: Diagnosis not present

## 2024-01-21 DIAGNOSIS — S81829A Laceration with foreign body, unspecified lower leg, initial encounter: Secondary | ICD-10-CM | POA: Diagnosis not present

## 2024-01-22 ENCOUNTER — Encounter (HOSPITAL_BASED_OUTPATIENT_CLINIC_OR_DEPARTMENT_OTHER): Admitting: General Surgery

## 2024-01-22 DIAGNOSIS — L97822 Non-pressure chronic ulcer of other part of left lower leg with fat layer exposed: Secondary | ICD-10-CM | POA: Diagnosis not present

## 2024-01-22 DIAGNOSIS — G629 Polyneuropathy, unspecified: Secondary | ICD-10-CM | POA: Diagnosis not present

## 2024-01-23 ENCOUNTER — Encounter (HOSPITAL_BASED_OUTPATIENT_CLINIC_OR_DEPARTMENT_OTHER): Admitting: General Surgery

## 2024-01-23 ENCOUNTER — Encounter (HOSPITAL_BASED_OUTPATIENT_CLINIC_OR_DEPARTMENT_OTHER): Payer: Self-pay

## 2024-01-24 DIAGNOSIS — S81829A Laceration with foreign body, unspecified lower leg, initial encounter: Secondary | ICD-10-CM | POA: Diagnosis not present

## 2024-01-25 DIAGNOSIS — S81829A Laceration with foreign body, unspecified lower leg, initial encounter: Secondary | ICD-10-CM | POA: Diagnosis not present

## 2024-01-26 DIAGNOSIS — S81829A Laceration with foreign body, unspecified lower leg, initial encounter: Secondary | ICD-10-CM | POA: Diagnosis not present

## 2024-01-28 DIAGNOSIS — S81829A Laceration with foreign body, unspecified lower leg, initial encounter: Secondary | ICD-10-CM | POA: Diagnosis not present

## 2024-01-29 DIAGNOSIS — S81829A Laceration with foreign body, unspecified lower leg, initial encounter: Secondary | ICD-10-CM | POA: Diagnosis not present

## 2024-01-30 DIAGNOSIS — E559 Vitamin D deficiency, unspecified: Secondary | ICD-10-CM | POA: Diagnosis not present

## 2024-01-30 DIAGNOSIS — R5383 Other fatigue: Secondary | ICD-10-CM | POA: Diagnosis not present

## 2024-01-30 DIAGNOSIS — Z1329 Encounter for screening for other suspected endocrine disorder: Secondary | ICD-10-CM | POA: Diagnosis not present

## 2024-01-30 DIAGNOSIS — E039 Hypothyroidism, unspecified: Secondary | ICD-10-CM | POA: Diagnosis not present

## 2024-01-30 DIAGNOSIS — N951 Menopausal and female climacteric states: Secondary | ICD-10-CM | POA: Diagnosis not present

## 2024-01-30 DIAGNOSIS — E538 Deficiency of other specified B group vitamins: Secondary | ICD-10-CM | POA: Diagnosis not present

## 2024-01-30 DIAGNOSIS — U099 Post covid-19 condition, unspecified: Secondary | ICD-10-CM | POA: Diagnosis not present

## 2024-01-30 DIAGNOSIS — Z131 Encounter for screening for diabetes mellitus: Secondary | ICD-10-CM | POA: Diagnosis not present

## 2024-01-30 DIAGNOSIS — E639 Nutritional deficiency, unspecified: Secondary | ICD-10-CM | POA: Diagnosis not present

## 2024-01-30 DIAGNOSIS — Z1321 Encounter for screening for nutritional disorder: Secondary | ICD-10-CM | POA: Diagnosis not present

## 2024-01-30 DIAGNOSIS — S81829A Laceration with foreign body, unspecified lower leg, initial encounter: Secondary | ICD-10-CM | POA: Diagnosis not present

## 2024-01-31 ENCOUNTER — Encounter (HOSPITAL_BASED_OUTPATIENT_CLINIC_OR_DEPARTMENT_OTHER): Admitting: General Surgery

## 2024-01-31 DIAGNOSIS — L97822 Non-pressure chronic ulcer of other part of left lower leg with fat layer exposed: Secondary | ICD-10-CM | POA: Diagnosis not present

## 2024-01-31 DIAGNOSIS — S81829A Laceration with foreign body, unspecified lower leg, initial encounter: Secondary | ICD-10-CM | POA: Diagnosis not present

## 2024-01-31 DIAGNOSIS — S81812A Laceration without foreign body, left lower leg, initial encounter: Secondary | ICD-10-CM | POA: Diagnosis not present

## 2024-01-31 DIAGNOSIS — G629 Polyneuropathy, unspecified: Secondary | ICD-10-CM | POA: Diagnosis not present

## 2024-02-02 DIAGNOSIS — S81829A Laceration with foreign body, unspecified lower leg, initial encounter: Secondary | ICD-10-CM | POA: Diagnosis not present

## 2024-02-03 DIAGNOSIS — S81829A Laceration with foreign body, unspecified lower leg, initial encounter: Secondary | ICD-10-CM | POA: Diagnosis not present

## 2024-02-04 ENCOUNTER — Ambulatory Visit (HOSPITAL_BASED_OUTPATIENT_CLINIC_OR_DEPARTMENT_OTHER): Admitting: Internal Medicine

## 2024-02-05 ENCOUNTER — Encounter (HOSPITAL_BASED_OUTPATIENT_CLINIC_OR_DEPARTMENT_OTHER): Admitting: General Surgery

## 2024-02-05 DIAGNOSIS — G629 Polyneuropathy, unspecified: Secondary | ICD-10-CM | POA: Diagnosis not present

## 2024-02-05 DIAGNOSIS — S81812A Laceration without foreign body, left lower leg, initial encounter: Secondary | ICD-10-CM | POA: Diagnosis not present

## 2024-02-05 DIAGNOSIS — S81829A Laceration with foreign body, unspecified lower leg, initial encounter: Secondary | ICD-10-CM | POA: Diagnosis not present

## 2024-02-05 DIAGNOSIS — L97822 Non-pressure chronic ulcer of other part of left lower leg with fat layer exposed: Secondary | ICD-10-CM | POA: Diagnosis not present

## 2024-02-08 ENCOUNTER — Other Ambulatory Visit (HOSPITAL_BASED_OUTPATIENT_CLINIC_OR_DEPARTMENT_OTHER): Payer: Self-pay

## 2024-02-08 DIAGNOSIS — S81829A Laceration with foreign body, unspecified lower leg, initial encounter: Secondary | ICD-10-CM | POA: Diagnosis not present

## 2024-02-08 MED ORDER — AMPHETAMINE-DEXTROAMPHET ER 30 MG PO CP24: 30.0000 mg | ORAL_CAPSULE | Freq: Every morning | ORAL | 0 refills | Status: AC

## 2024-02-08 MED ORDER — AMPHETAMINE-DEXTROAMPHET ER 30 MG PO CP24
30.0000 mg | ORAL_CAPSULE | Freq: Every morning | ORAL | 0 refills | Status: AC
  Filled 2024-02-08: qty 30, 30d supply, fill #0

## 2024-02-08 MED ORDER — AMPHETAMINE-DEXTROAMPHET ER 30 MG PO CP24
30.0000 mg | ORAL_CAPSULE | Freq: Every morning | ORAL | 0 refills | Status: AC
  Filled 2024-03-25: qty 30, 30d supply, fill #0

## 2024-02-09 DIAGNOSIS — S81829A Laceration with foreign body, unspecified lower leg, initial encounter: Secondary | ICD-10-CM | POA: Diagnosis not present

## 2024-02-10 DIAGNOSIS — S81829A Laceration with foreign body, unspecified lower leg, initial encounter: Secondary | ICD-10-CM | POA: Diagnosis not present

## 2024-02-11 DIAGNOSIS — S81829A Laceration with foreign body, unspecified lower leg, initial encounter: Secondary | ICD-10-CM | POA: Diagnosis not present

## 2024-02-12 ENCOUNTER — Encounter (HOSPITAL_BASED_OUTPATIENT_CLINIC_OR_DEPARTMENT_OTHER): Admitting: General Surgery

## 2024-02-12 DIAGNOSIS — G629 Polyneuropathy, unspecified: Secondary | ICD-10-CM | POA: Diagnosis not present

## 2024-02-12 DIAGNOSIS — L97822 Non-pressure chronic ulcer of other part of left lower leg with fat layer exposed: Secondary | ICD-10-CM | POA: Diagnosis not present

## 2024-02-13 DIAGNOSIS — S81829A Laceration with foreign body, unspecified lower leg, initial encounter: Secondary | ICD-10-CM | POA: Diagnosis not present

## 2024-02-14 DIAGNOSIS — S81829A Laceration with foreign body, unspecified lower leg, initial encounter: Secondary | ICD-10-CM | POA: Diagnosis not present

## 2024-02-15 DIAGNOSIS — S81829A Laceration with foreign body, unspecified lower leg, initial encounter: Secondary | ICD-10-CM | POA: Diagnosis not present

## 2024-02-16 DIAGNOSIS — S81829A Laceration with foreign body, unspecified lower leg, initial encounter: Secondary | ICD-10-CM | POA: Diagnosis not present

## 2024-02-17 DIAGNOSIS — S81829A Laceration with foreign body, unspecified lower leg, initial encounter: Secondary | ICD-10-CM | POA: Diagnosis not present

## 2024-02-18 ENCOUNTER — Encounter (HOSPITAL_BASED_OUTPATIENT_CLINIC_OR_DEPARTMENT_OTHER): Attending: General Surgery | Admitting: General Surgery

## 2024-02-18 DIAGNOSIS — L97822 Non-pressure chronic ulcer of other part of left lower leg with fat layer exposed: Secondary | ICD-10-CM | POA: Diagnosis not present

## 2024-02-18 DIAGNOSIS — G629 Polyneuropathy, unspecified: Secondary | ICD-10-CM | POA: Diagnosis not present

## 2024-02-18 DIAGNOSIS — S81812A Laceration without foreign body, left lower leg, initial encounter: Secondary | ICD-10-CM | POA: Diagnosis not present

## 2024-02-18 DIAGNOSIS — S81829A Laceration with foreign body, unspecified lower leg, initial encounter: Secondary | ICD-10-CM | POA: Diagnosis not present

## 2024-02-19 ENCOUNTER — Ambulatory Visit (HOSPITAL_BASED_OUTPATIENT_CLINIC_OR_DEPARTMENT_OTHER): Admitting: General Surgery

## 2024-02-26 ENCOUNTER — Ambulatory Visit (HOSPITAL_BASED_OUTPATIENT_CLINIC_OR_DEPARTMENT_OTHER): Admitting: Internal Medicine

## 2024-03-03 ENCOUNTER — Ambulatory Visit (HOSPITAL_BASED_OUTPATIENT_CLINIC_OR_DEPARTMENT_OTHER): Admitting: General Surgery

## 2024-03-04 ENCOUNTER — Encounter (HOSPITAL_BASED_OUTPATIENT_CLINIC_OR_DEPARTMENT_OTHER): Admitting: General Surgery

## 2024-03-11 ENCOUNTER — Ambulatory Visit (HOSPITAL_BASED_OUTPATIENT_CLINIC_OR_DEPARTMENT_OTHER): Admitting: General Surgery

## 2024-03-19 ENCOUNTER — Encounter (HOSPITAL_BASED_OUTPATIENT_CLINIC_OR_DEPARTMENT_OTHER): Attending: General Surgery | Admitting: General Surgery

## 2024-03-19 DIAGNOSIS — L97822 Non-pressure chronic ulcer of other part of left lower leg with fat layer exposed: Secondary | ICD-10-CM | POA: Insufficient documentation

## 2024-03-19 DIAGNOSIS — G629 Polyneuropathy, unspecified: Secondary | ICD-10-CM | POA: Insufficient documentation

## 2024-03-19 DIAGNOSIS — S81812D Laceration without foreign body, left lower leg, subsequent encounter: Secondary | ICD-10-CM | POA: Diagnosis not present

## 2024-03-25 ENCOUNTER — Other Ambulatory Visit (HOSPITAL_BASED_OUTPATIENT_CLINIC_OR_DEPARTMENT_OTHER): Payer: Self-pay

## 2024-03-25 ENCOUNTER — Ambulatory Visit (HOSPITAL_BASED_OUTPATIENT_CLINIC_OR_DEPARTMENT_OTHER): Admitting: General Surgery

## 2024-03-26 ENCOUNTER — Encounter (HOSPITAL_BASED_OUTPATIENT_CLINIC_OR_DEPARTMENT_OTHER): Admitting: General Surgery

## 2024-03-27 ENCOUNTER — Other Ambulatory Visit (HOSPITAL_BASED_OUTPATIENT_CLINIC_OR_DEPARTMENT_OTHER): Payer: Self-pay

## 2024-03-31 ENCOUNTER — Other Ambulatory Visit (HOSPITAL_COMMUNITY): Payer: Self-pay

## 2024-03-31 DIAGNOSIS — U099 Post covid-19 condition, unspecified: Secondary | ICD-10-CM | POA: Diagnosis not present

## 2024-03-31 DIAGNOSIS — N951 Menopausal and female climacteric states: Secondary | ICD-10-CM | POA: Diagnosis not present

## 2024-03-31 DIAGNOSIS — R5383 Other fatigue: Secondary | ICD-10-CM | POA: Diagnosis not present

## 2024-03-31 DIAGNOSIS — E039 Hypothyroidism, unspecified: Secondary | ICD-10-CM | POA: Diagnosis not present

## 2024-03-31 MED ORDER — THYROID 15 MG PO TABS
15.0000 mg | ORAL_TABLET | ORAL | 2 refills | Status: AC
  Filled 2024-03-31 – 2024-04-08 (×2): qty 30, 30d supply, fill #0
  Filled 2024-05-23: qty 30, 30d supply, fill #1
  Filled 2024-06-13 – 2024-06-30 (×2): qty 30, 30d supply, fill #2
  Filled 2024-07-23: qty 30, 30d supply, fill #3
  Filled 2024-09-09: qty 30, 30d supply, fill #4
  Filled 2024-10-15: qty 30, 30d supply, fill #5

## 2024-04-08 ENCOUNTER — Other Ambulatory Visit (HOSPITAL_BASED_OUTPATIENT_CLINIC_OR_DEPARTMENT_OTHER): Payer: Self-pay

## 2024-04-08 ENCOUNTER — Other Ambulatory Visit (HOSPITAL_COMMUNITY): Payer: Self-pay

## 2024-04-15 ENCOUNTER — Other Ambulatory Visit (HOSPITAL_BASED_OUTPATIENT_CLINIC_OR_DEPARTMENT_OTHER): Payer: Self-pay

## 2024-04-30 ENCOUNTER — Other Ambulatory Visit (HOSPITAL_BASED_OUTPATIENT_CLINIC_OR_DEPARTMENT_OTHER): Payer: Self-pay

## 2024-04-30 DIAGNOSIS — J4530 Mild persistent asthma, uncomplicated: Secondary | ICD-10-CM | POA: Diagnosis not present

## 2024-04-30 DIAGNOSIS — F419 Anxiety disorder, unspecified: Secondary | ICD-10-CM | POA: Diagnosis not present

## 2024-04-30 DIAGNOSIS — F902 Attention-deficit hyperactivity disorder, combined type: Secondary | ICD-10-CM | POA: Diagnosis not present

## 2024-04-30 DIAGNOSIS — J301 Allergic rhinitis due to pollen: Secondary | ICD-10-CM | POA: Diagnosis not present

## 2024-04-30 MED ORDER — AMPHETAMINE-DEXTROAMPHET ER 30 MG PO CP24
30.0000 mg | ORAL_CAPSULE | Freq: Every morning | ORAL | 0 refills | Status: AC
  Filled 2024-04-30: qty 30, 30d supply, fill #0

## 2024-04-30 MED ORDER — AMPHETAMINE-DEXTROAMPHET ER 30 MG PO CP24
30.0000 mg | ORAL_CAPSULE | Freq: Every morning | ORAL | 0 refills | Status: AC
  Filled 2024-06-13: qty 30, 30d supply, fill #0

## 2024-04-30 MED ORDER — ESCITALOPRAM OXALATE 10 MG PO TABS
15.0000 mg | ORAL_TABLET | Freq: Every day | ORAL | 0 refills | Status: DC
  Filled 2024-06-13 – 2024-06-30 (×2): qty 135, 90d supply, fill #0

## 2024-04-30 MED ORDER — ESTRADIOL 10 MCG VA TABS
10.0000 ug | ORAL_TABLET | VAGINAL | 1 refills | Status: AC
  Filled 2024-04-30: qty 8, 28d supply, fill #0
  Filled 2024-06-13: qty 8, 28d supply, fill #1
  Filled 2024-07-23 – 2024-07-28 (×2): qty 8, 28d supply, fill #2

## 2024-04-30 MED ORDER — AMPHETAMINE-DEXTROAMPHET ER 30 MG PO CP24
30.0000 mg | ORAL_CAPSULE | Freq: Every morning | ORAL | 0 refills | Status: DC
  Filled 2024-07-23: qty 30, 30d supply, fill #0

## 2024-05-22 ENCOUNTER — Other Ambulatory Visit (HOSPITAL_BASED_OUTPATIENT_CLINIC_OR_DEPARTMENT_OTHER): Payer: Self-pay

## 2024-05-23 ENCOUNTER — Other Ambulatory Visit (HOSPITAL_BASED_OUTPATIENT_CLINIC_OR_DEPARTMENT_OTHER): Payer: Self-pay

## 2024-06-13 ENCOUNTER — Other Ambulatory Visit (HOSPITAL_BASED_OUTPATIENT_CLINIC_OR_DEPARTMENT_OTHER): Payer: Self-pay

## 2024-06-13 ENCOUNTER — Other Ambulatory Visit: Payer: Self-pay

## 2024-06-30 ENCOUNTER — Other Ambulatory Visit (HOSPITAL_BASED_OUTPATIENT_CLINIC_OR_DEPARTMENT_OTHER): Payer: Self-pay

## 2024-07-16 DIAGNOSIS — E559 Vitamin D deficiency, unspecified: Secondary | ICD-10-CM | POA: Diagnosis not present

## 2024-07-16 DIAGNOSIS — D7589 Other specified diseases of blood and blood-forming organs: Secondary | ICD-10-CM | POA: Diagnosis not present

## 2024-07-16 DIAGNOSIS — Z0000 Encounter for general adult medical examination without abnormal findings: Secondary | ICD-10-CM | POA: Diagnosis not present

## 2024-07-16 DIAGNOSIS — Z1159 Encounter for screening for other viral diseases: Secondary | ICD-10-CM | POA: Diagnosis not present

## 2024-07-16 DIAGNOSIS — Z13220 Encounter for screening for lipoid disorders: Secondary | ICD-10-CM | POA: Diagnosis not present

## 2024-07-23 ENCOUNTER — Other Ambulatory Visit: Payer: Self-pay

## 2024-07-23 ENCOUNTER — Other Ambulatory Visit (HOSPITAL_BASED_OUTPATIENT_CLINIC_OR_DEPARTMENT_OTHER): Payer: Self-pay

## 2024-07-26 ENCOUNTER — Other Ambulatory Visit (HOSPITAL_BASED_OUTPATIENT_CLINIC_OR_DEPARTMENT_OTHER): Payer: Self-pay | Admitting: Family Medicine

## 2024-07-26 DIAGNOSIS — E2839 Other primary ovarian failure: Secondary | ICD-10-CM

## 2024-07-28 ENCOUNTER — Other Ambulatory Visit (HOSPITAL_BASED_OUTPATIENT_CLINIC_OR_DEPARTMENT_OTHER): Payer: Self-pay

## 2024-09-02 ENCOUNTER — Other Ambulatory Visit (HOSPITAL_BASED_OUTPATIENT_CLINIC_OR_DEPARTMENT_OTHER): Payer: Self-pay

## 2024-09-02 ENCOUNTER — Other Ambulatory Visit: Payer: Self-pay

## 2024-09-03 ENCOUNTER — Other Ambulatory Visit (HOSPITAL_BASED_OUTPATIENT_CLINIC_OR_DEPARTMENT_OTHER): Payer: Self-pay

## 2024-09-03 MED ORDER — AMPHETAMINE-DEXTROAMPHET ER 30 MG PO CP24
30.0000 mg | ORAL_CAPSULE | Freq: Every day | ORAL | 0 refills | Status: AC
  Filled 2024-10-28: qty 30, 30d supply, fill #0

## 2024-09-03 MED ORDER — AMPHETAMINE-DEXTROAMPHET ER 30 MG PO CP24
30.0000 mg | ORAL_CAPSULE | Freq: Every morning | ORAL | 0 refills | Status: AC
  Filled 2024-09-03: qty 30, 30d supply, fill #0

## 2024-09-03 MED ORDER — AMPHETAMINE-DEXTROAMPHET ER 30 MG PO CP24: 30.0000 mg | ORAL_CAPSULE | Freq: Every day | ORAL | 0 refills | Status: AC

## 2024-09-09 ENCOUNTER — Other Ambulatory Visit (HOSPITAL_BASED_OUTPATIENT_CLINIC_OR_DEPARTMENT_OTHER): Payer: Self-pay

## 2024-09-17 ENCOUNTER — Other Ambulatory Visit

## 2024-09-30 ENCOUNTER — Other Ambulatory Visit (HOSPITAL_BASED_OUTPATIENT_CLINIC_OR_DEPARTMENT_OTHER): Payer: Self-pay

## 2024-10-15 ENCOUNTER — Other Ambulatory Visit (HOSPITAL_BASED_OUTPATIENT_CLINIC_OR_DEPARTMENT_OTHER): Payer: Self-pay

## 2024-10-15 ENCOUNTER — Other Ambulatory Visit: Payer: Self-pay

## 2024-10-15 MED ORDER — ESCITALOPRAM OXALATE 10 MG PO TABS
15.0000 mg | ORAL_TABLET | Freq: Every day | ORAL | 0 refills | Status: DC
  Filled 2024-10-15: qty 135, 90d supply, fill #0

## 2024-10-28 ENCOUNTER — Other Ambulatory Visit (HOSPITAL_BASED_OUTPATIENT_CLINIC_OR_DEPARTMENT_OTHER): Payer: Self-pay

## 2024-10-28 MED ORDER — DICLOFENAC SODIUM 50 MG PO TBEC
50.0000 mg | DELAYED_RELEASE_TABLET | Freq: Two times a day (BID) | ORAL | 1 refills | Status: AC
  Filled 2024-10-28: qty 60, 30d supply, fill #0

## 2024-11-04 ENCOUNTER — Other Ambulatory Visit: Payer: Self-pay

## 2024-11-04 ENCOUNTER — Other Ambulatory Visit (HOSPITAL_BASED_OUTPATIENT_CLINIC_OR_DEPARTMENT_OTHER): Payer: Self-pay

## 2024-11-04 MED ORDER — ESTRADIOL 10 MCG VA TABS
10.0000 ug | ORAL_TABLET | VAGINAL | 1 refills | Status: AC
  Filled 2024-11-04: qty 16, 56d supply, fill #0

## 2024-11-04 MED ORDER — AMPHETAMINE-DEXTROAMPHET ER 30 MG PO CP24
30.0000 mg | ORAL_CAPSULE | Freq: Every morning | ORAL | 0 refills | Status: AC
  Filled 2024-11-04: qty 30, 30d supply, fill #0

## 2024-11-04 MED ORDER — FLUTICASONE PROPIONATE 50 MCG/ACT NA SUSP
2.0000 | Freq: Every day | NASAL | 1 refills | Status: AC
  Filled 2024-11-04: qty 16, 30d supply, fill #0

## 2024-11-04 MED ORDER — FEXOFENADINE HCL 180 MG PO TABS
180.0000 mg | ORAL_TABLET | Freq: Every day | ORAL | 1 refills | Status: AC
  Filled 2024-11-04: qty 30, 30d supply, fill #0

## 2024-11-04 MED ORDER — BUDESONIDE-FORMOTEROL FUMARATE 160-4.5 MCG/ACT IN AERO
2.0000 | INHALATION_SPRAY | Freq: Every day | RESPIRATORY_TRACT | 1 refills | Status: AC
  Filled 2024-11-04: qty 10.2, 30d supply, fill #0

## 2024-11-04 MED ORDER — ESCITALOPRAM OXALATE 10 MG PO TABS
15.0000 mg | ORAL_TABLET | Freq: Every day | ORAL | 1 refills | Status: AC
  Filled 2024-11-04: qty 45, 30d supply, fill #0

## 2024-11-04 MED ORDER — AMPHETAMINE-DEXTROAMPHETAMINE 10 MG PO TABS
10.0000 mg | ORAL_TABLET | Freq: Every day | ORAL | 0 refills | Status: AC
  Filled 2024-11-04: qty 30, 30d supply, fill #0

## 2024-11-05 ENCOUNTER — Other Ambulatory Visit
